# Patient Record
Sex: Female | Born: 1980 | Race: Black or African American | Hispanic: No | Marital: Married | Smoking: Never smoker
Health system: Southern US, Community
[De-identification: ages and names within clinical notes are randomized; demographics above are authoritative.]

## PROBLEM LIST (undated history)

## (undated) ENCOUNTER — Inpatient Hospital Stay (HOSPITAL_COMMUNITY): Payer: Self-pay

## (undated) DIAGNOSIS — K449 Diaphragmatic hernia without obstruction or gangrene: Secondary | ICD-10-CM

## (undated) DIAGNOSIS — M5430 Sciatica, unspecified side: Secondary | ICD-10-CM

## (undated) DIAGNOSIS — Z8489 Family history of other specified conditions: Secondary | ICD-10-CM

## (undated) DIAGNOSIS — M797 Fibromyalgia: Secondary | ICD-10-CM

## (undated) DIAGNOSIS — D649 Anemia, unspecified: Secondary | ICD-10-CM

## (undated) DIAGNOSIS — N393 Stress incontinence (female) (male): Secondary | ICD-10-CM

## (undated) HISTORY — PX: TONSILLECTOMY: SUR1361

## (undated) HISTORY — PX: LAPAROSCOPIC GASTRIC SLEEVE RESECTION WITH HIATAL HERNIA REPAIR: SHX6512

## (undated) HISTORY — PX: ADENOIDECTOMY: SUR15

## (undated) HISTORY — PX: HERNIA REPAIR: SHX51

## (undated) HISTORY — PX: TYMPANOSTOMY TUBE PLACEMENT: SHX32

---
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
## 2000-06-20 ENCOUNTER — Emergency Department (HOSPITAL_COMMUNITY): Admission: EM | Admit: 2000-06-20 | Discharge: 2000-06-20 | Payer: Self-pay | Admitting: Emergency Medicine

## 2000-06-20 ENCOUNTER — Encounter: Payer: Self-pay | Admitting: Emergency Medicine

## 2000-11-29 ENCOUNTER — Inpatient Hospital Stay (HOSPITAL_COMMUNITY): Admission: AD | Admit: 2000-11-29 | Discharge: 2000-11-29 | Payer: Self-pay | Admitting: Obstetrics & Gynecology

## 2001-02-02 ENCOUNTER — Inpatient Hospital Stay (HOSPITAL_COMMUNITY): Admission: AD | Admit: 2001-02-02 | Discharge: 2001-02-02 | Payer: Self-pay | Admitting: Obstetrics

## 2001-02-26 ENCOUNTER — Inpatient Hospital Stay (HOSPITAL_COMMUNITY): Admission: AD | Admit: 2001-02-26 | Discharge: 2001-02-26 | Payer: Self-pay | Admitting: *Deleted

## 2001-05-09 ENCOUNTER — Ambulatory Visit (HOSPITAL_COMMUNITY): Admission: RE | Admit: 2001-05-09 | Discharge: 2001-05-09 | Payer: Self-pay | Admitting: Obstetrics

## 2001-06-14 ENCOUNTER — Ambulatory Visit (HOSPITAL_COMMUNITY): Admission: RE | Admit: 2001-06-14 | Discharge: 2001-06-14 | Payer: Self-pay | Admitting: Obstetrics & Gynecology

## 2001-08-12 ENCOUNTER — Inpatient Hospital Stay (HOSPITAL_COMMUNITY): Admission: AD | Admit: 2001-08-12 | Discharge: 2001-08-12 | Payer: Self-pay | Admitting: *Deleted

## 2001-08-17 ENCOUNTER — Inpatient Hospital Stay (HOSPITAL_COMMUNITY): Admission: AD | Admit: 2001-08-17 | Discharge: 2001-08-17 | Payer: Self-pay | Admitting: Obstetrics and Gynecology

## 2001-09-24 ENCOUNTER — Inpatient Hospital Stay (HOSPITAL_COMMUNITY): Admission: AD | Admit: 2001-09-24 | Discharge: 2001-09-24 | Payer: Self-pay | Admitting: *Deleted

## 2001-09-24 ENCOUNTER — Encounter: Payer: Self-pay | Admitting: *Deleted

## 2001-09-25 ENCOUNTER — Inpatient Hospital Stay (HOSPITAL_COMMUNITY): Admission: AD | Admit: 2001-09-25 | Discharge: 2001-09-25 | Payer: Self-pay | Admitting: Obstetrics and Gynecology

## 2001-09-26 ENCOUNTER — Inpatient Hospital Stay (HOSPITAL_COMMUNITY): Admission: AD | Admit: 2001-09-26 | Discharge: 2001-09-26 | Payer: Self-pay | Admitting: *Deleted

## 2001-10-04 ENCOUNTER — Inpatient Hospital Stay (HOSPITAL_COMMUNITY): Admission: AD | Admit: 2001-10-04 | Discharge: 2001-10-04 | Payer: Self-pay | Admitting: *Deleted

## 2001-10-06 ENCOUNTER — Inpatient Hospital Stay (HOSPITAL_COMMUNITY): Admission: AD | Admit: 2001-10-06 | Discharge: 2001-10-06 | Payer: Self-pay | Admitting: *Deleted

## 2001-10-06 ENCOUNTER — Encounter (HOSPITAL_COMMUNITY): Admission: RE | Admit: 2001-10-06 | Discharge: 2001-10-06 | Payer: Self-pay | Admitting: *Deleted

## 2001-10-11 ENCOUNTER — Inpatient Hospital Stay (HOSPITAL_COMMUNITY): Admission: AD | Admit: 2001-10-11 | Discharge: 2001-10-14 | Payer: Self-pay | Admitting: *Deleted

## 2002-05-08 ENCOUNTER — Emergency Department (HOSPITAL_COMMUNITY): Admission: EM | Admit: 2002-05-08 | Discharge: 2002-05-08 | Payer: Self-pay | Admitting: Emergency Medicine

## 2002-05-24 ENCOUNTER — Inpatient Hospital Stay (HOSPITAL_COMMUNITY): Admission: AD | Admit: 2002-05-24 | Discharge: 2002-05-24 | Payer: Self-pay | Admitting: Obstetrics and Gynecology

## 2002-06-09 ENCOUNTER — Emergency Department (HOSPITAL_COMMUNITY): Admission: EM | Admit: 2002-06-09 | Discharge: 2002-06-09 | Payer: Self-pay | Admitting: Emergency Medicine

## 2002-06-09 ENCOUNTER — Encounter: Payer: Self-pay | Admitting: Emergency Medicine

## 2002-08-14 ENCOUNTER — Encounter: Payer: Self-pay | Admitting: Emergency Medicine

## 2002-08-14 ENCOUNTER — Emergency Department (HOSPITAL_COMMUNITY): Admission: EM | Admit: 2002-08-14 | Discharge: 2002-08-14 | Payer: Self-pay | Admitting: Emergency Medicine

## 2002-11-06 ENCOUNTER — Inpatient Hospital Stay (HOSPITAL_COMMUNITY): Admission: AD | Admit: 2002-11-06 | Discharge: 2002-11-06 | Payer: Self-pay | Admitting: Obstetrics and Gynecology

## 2002-11-08 ENCOUNTER — Inpatient Hospital Stay (HOSPITAL_COMMUNITY): Admission: AD | Admit: 2002-11-08 | Discharge: 2002-11-08 | Payer: Self-pay | Admitting: Obstetrics and Gynecology

## 2003-05-13 ENCOUNTER — Encounter (INDEPENDENT_AMBULATORY_CARE_PROVIDER_SITE_OTHER): Payer: Self-pay | Admitting: Specialist

## 2003-05-13 ENCOUNTER — Inpatient Hospital Stay (HOSPITAL_COMMUNITY): Admission: AD | Admit: 2003-05-13 | Discharge: 2003-05-13 | Payer: Self-pay | Admitting: Obstetrics & Gynecology

## 2003-05-15 ENCOUNTER — Inpatient Hospital Stay (HOSPITAL_COMMUNITY): Admission: AD | Admit: 2003-05-15 | Discharge: 2003-05-15 | Payer: Self-pay | Admitting: Obstetrics and Gynecology

## 2003-05-17 ENCOUNTER — Inpatient Hospital Stay (HOSPITAL_COMMUNITY): Admission: AD | Admit: 2003-05-17 | Discharge: 2003-05-17 | Payer: Self-pay | Admitting: Obstetrics and Gynecology

## 2003-05-20 ENCOUNTER — Inpatient Hospital Stay (HOSPITAL_COMMUNITY): Admission: AD | Admit: 2003-05-20 | Discharge: 2003-05-20 | Payer: Self-pay | Admitting: Obstetrics and Gynecology

## 2003-05-23 ENCOUNTER — Inpatient Hospital Stay (HOSPITAL_COMMUNITY): Admission: AD | Admit: 2003-05-23 | Discharge: 2003-05-23 | Payer: Self-pay | Admitting: Obstetrics and Gynecology

## 2003-05-30 ENCOUNTER — Inpatient Hospital Stay (HOSPITAL_COMMUNITY): Admission: AD | Admit: 2003-05-30 | Discharge: 2003-05-30 | Payer: Self-pay | Admitting: Obstetrics and Gynecology

## 2003-09-18 ENCOUNTER — Emergency Department (HOSPITAL_COMMUNITY): Admission: EM | Admit: 2003-09-18 | Discharge: 2003-09-18 | Payer: Self-pay | Admitting: Emergency Medicine

## 2004-11-19 ENCOUNTER — Inpatient Hospital Stay (HOSPITAL_COMMUNITY): Admission: AD | Admit: 2004-11-19 | Discharge: 2004-11-19 | Payer: Self-pay | Admitting: Obstetrics and Gynecology

## 2005-01-05 ENCOUNTER — Emergency Department (HOSPITAL_COMMUNITY): Admission: EM | Admit: 2005-01-05 | Discharge: 2005-01-05 | Payer: Self-pay | Admitting: Family Medicine

## 2005-07-30 ENCOUNTER — Emergency Department (HOSPITAL_COMMUNITY): Admission: EM | Admit: 2005-07-30 | Discharge: 2005-07-30 | Payer: Self-pay | Admitting: Family Medicine

## 2005-09-19 ENCOUNTER — Emergency Department (HOSPITAL_COMMUNITY): Admission: EM | Admit: 2005-09-19 | Discharge: 2005-09-19 | Payer: Self-pay | Admitting: Family Medicine

## 2005-11-17 ENCOUNTER — Emergency Department (HOSPITAL_COMMUNITY): Admission: EM | Admit: 2005-11-17 | Discharge: 2005-11-17 | Payer: Self-pay | Admitting: Family Medicine

## 2006-02-22 ENCOUNTER — Emergency Department (HOSPITAL_COMMUNITY): Admission: EM | Admit: 2006-02-22 | Discharge: 2006-02-22 | Payer: Self-pay | Admitting: Family Medicine

## 2006-05-26 ENCOUNTER — Emergency Department (HOSPITAL_COMMUNITY): Admission: EM | Admit: 2006-05-26 | Discharge: 2006-05-26 | Payer: Self-pay | Admitting: Emergency Medicine

## 2006-06-01 ENCOUNTER — Emergency Department (HOSPITAL_COMMUNITY): Admission: EM | Admit: 2006-06-01 | Discharge: 2006-06-01 | Payer: Self-pay | Admitting: Family Medicine

## 2006-10-07 ENCOUNTER — Emergency Department (HOSPITAL_COMMUNITY): Admission: EM | Admit: 2006-10-07 | Discharge: 2006-10-07 | Payer: Self-pay | Admitting: Emergency Medicine

## 2007-03-25 ENCOUNTER — Emergency Department (HOSPITAL_COMMUNITY): Admission: EM | Admit: 2007-03-25 | Discharge: 2007-03-25 | Payer: Self-pay | Admitting: Family Medicine

## 2007-03-27 ENCOUNTER — Emergency Department (HOSPITAL_COMMUNITY): Admission: EM | Admit: 2007-03-27 | Discharge: 2007-03-27 | Payer: Self-pay | Admitting: Emergency Medicine

## 2007-06-24 ENCOUNTER — Emergency Department (HOSPITAL_COMMUNITY): Admission: EM | Admit: 2007-06-24 | Discharge: 2007-06-24 | Payer: Self-pay | Admitting: Emergency Medicine

## 2007-08-03 ENCOUNTER — Inpatient Hospital Stay (HOSPITAL_COMMUNITY): Admission: AD | Admit: 2007-08-03 | Discharge: 2007-08-03 | Payer: Self-pay | Admitting: Gynecology

## 2007-08-15 ENCOUNTER — Inpatient Hospital Stay (HOSPITAL_COMMUNITY): Admission: AD | Admit: 2007-08-15 | Discharge: 2007-08-15 | Payer: Self-pay | Admitting: Gynecology

## 2007-10-27 ENCOUNTER — Ambulatory Visit (HOSPITAL_COMMUNITY): Admission: RE | Admit: 2007-10-27 | Discharge: 2007-10-27 | Payer: Self-pay | Admitting: Obstetrics & Gynecology

## 2007-11-02 ENCOUNTER — Emergency Department (HOSPITAL_COMMUNITY): Admission: EM | Admit: 2007-11-02 | Discharge: 2007-11-02 | Payer: Self-pay | Admitting: Emergency Medicine

## 2008-02-02 ENCOUNTER — Ambulatory Visit (HOSPITAL_COMMUNITY): Admission: RE | Admit: 2008-02-02 | Discharge: 2008-02-02 | Payer: Self-pay | Admitting: Obstetrics & Gynecology

## 2008-02-23 ENCOUNTER — Ambulatory Visit (HOSPITAL_COMMUNITY): Admission: RE | Admit: 2008-02-23 | Discharge: 2008-02-23 | Payer: Self-pay | Admitting: Obstetrics & Gynecology

## 2008-03-12 ENCOUNTER — Inpatient Hospital Stay (HOSPITAL_COMMUNITY): Admission: AD | Admit: 2008-03-12 | Discharge: 2008-03-13 | Payer: Self-pay | Admitting: Obstetrics & Gynecology

## 2008-03-17 ENCOUNTER — Inpatient Hospital Stay (HOSPITAL_COMMUNITY): Admission: AD | Admit: 2008-03-17 | Discharge: 2008-03-19 | Payer: Self-pay | Admitting: Obstetrics & Gynecology

## 2008-11-16 ENCOUNTER — Inpatient Hospital Stay (HOSPITAL_COMMUNITY): Admission: AD | Admit: 2008-11-16 | Discharge: 2008-11-16 | Payer: Self-pay | Admitting: Obstetrics & Gynecology

## 2008-11-26 ENCOUNTER — Ambulatory Visit (HOSPITAL_COMMUNITY): Admission: RE | Admit: 2008-11-26 | Discharge: 2008-11-26 | Payer: Self-pay | Admitting: Obstetrics & Gynecology

## 2008-11-26 ENCOUNTER — Inpatient Hospital Stay (HOSPITAL_COMMUNITY): Admission: RE | Admit: 2008-11-26 | Discharge: 2008-11-26 | Payer: Self-pay | Admitting: Obstetrics & Gynecology

## 2009-05-22 DIAGNOSIS — J45909 Unspecified asthma, uncomplicated: Secondary | ICD-10-CM

## 2009-05-22 DIAGNOSIS — R002 Palpitations: Secondary | ICD-10-CM

## 2009-05-22 DIAGNOSIS — G43909 Migraine, unspecified, not intractable, without status migrainosus: Secondary | ICD-10-CM | POA: Insufficient documentation

## 2009-05-22 DIAGNOSIS — N390 Urinary tract infection, site not specified: Secondary | ICD-10-CM | POA: Insufficient documentation

## 2009-05-22 DIAGNOSIS — R0602 Shortness of breath: Secondary | ICD-10-CM | POA: Insufficient documentation

## 2009-05-23 ENCOUNTER — Ambulatory Visit: Payer: Self-pay | Admitting: Cardiology

## 2009-05-23 DIAGNOSIS — I951 Orthostatic hypotension: Secondary | ICD-10-CM

## 2009-05-23 DIAGNOSIS — R55 Syncope and collapse: Secondary | ICD-10-CM

## 2009-07-03 ENCOUNTER — Inpatient Hospital Stay (HOSPITAL_COMMUNITY): Admission: AD | Admit: 2009-07-03 | Discharge: 2009-07-05 | Payer: Self-pay | Admitting: Obstetrics and Gynecology

## 2009-09-25 ENCOUNTER — Emergency Department (HOSPITAL_COMMUNITY): Admission: EM | Admit: 2009-09-25 | Discharge: 2009-09-25 | Payer: Self-pay | Admitting: Family Medicine

## 2009-12-14 ENCOUNTER — Emergency Department (HOSPITAL_COMMUNITY): Admission: EM | Admit: 2009-12-14 | Discharge: 2009-12-14 | Payer: Self-pay | Admitting: Family Medicine

## 2010-02-21 ENCOUNTER — Emergency Department (HOSPITAL_COMMUNITY): Admission: EM | Admit: 2010-02-21 | Discharge: 2010-02-21 | Payer: Self-pay | Admitting: Emergency Medicine

## 2010-02-23 ENCOUNTER — Emergency Department (HOSPITAL_COMMUNITY): Admission: EM | Admit: 2010-02-23 | Discharge: 2010-02-23 | Payer: Self-pay | Admitting: Emergency Medicine

## 2010-03-25 IMAGING — US US OB TRANSVAGINAL
1 series · 14 of 28 positions shown · non-contrast
Comparison: none

OBSTETRICAL ULTRASOUND:
 This ultrasound exam was performed in the [HOSPITAL] Ultrasound Department.  The OB US report was generated in the AS system, and faxed to the ordering physician.  This report is also available in [REDACTED] PACS.

[Series 1: us ob transvaginal · 14 of 29 slices shown]
[im 2/29]
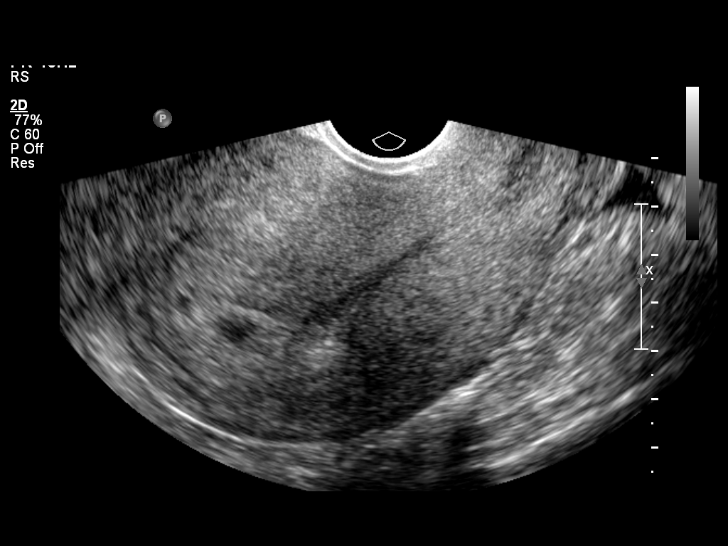
[im 4/29]
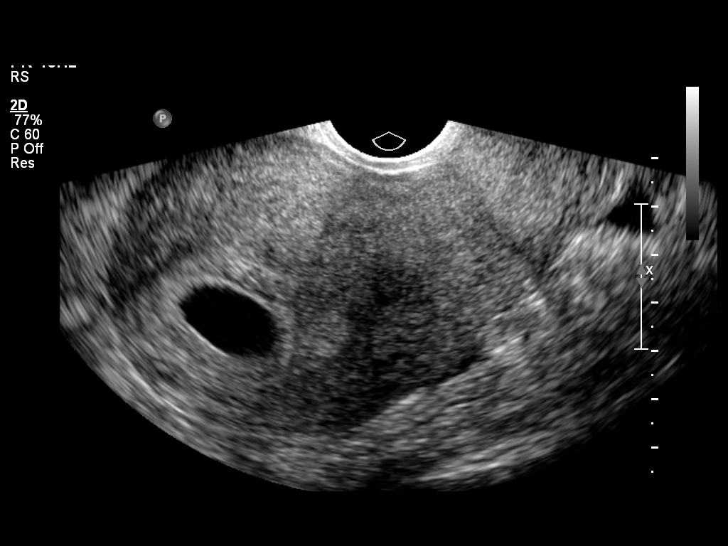
[im 6/29]
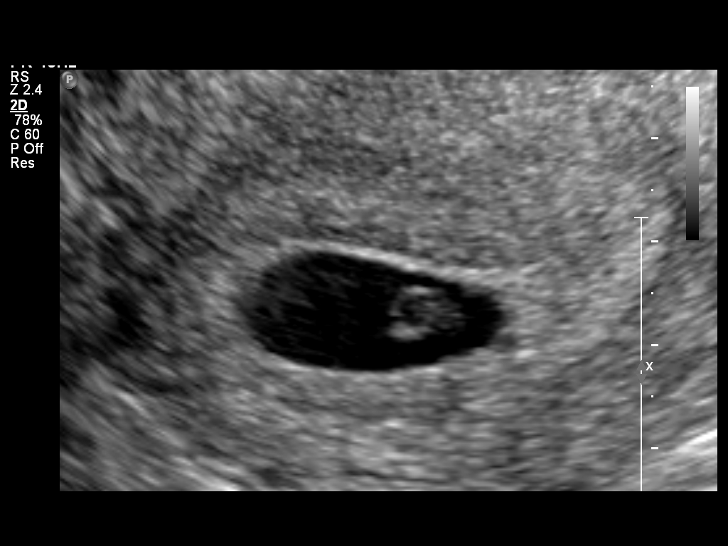
[im 8/29]
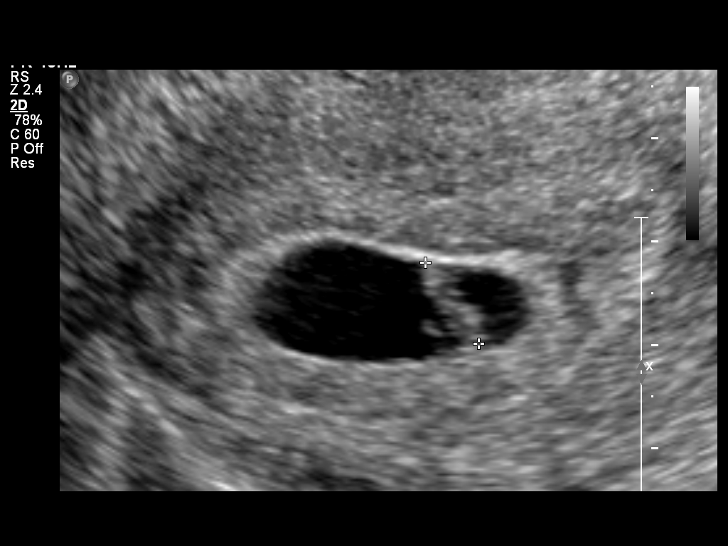
[im 10/29]
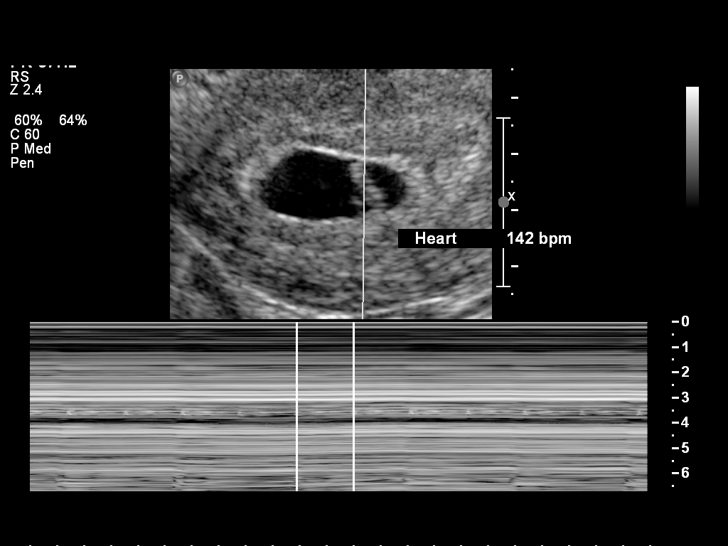
[im 12/29]
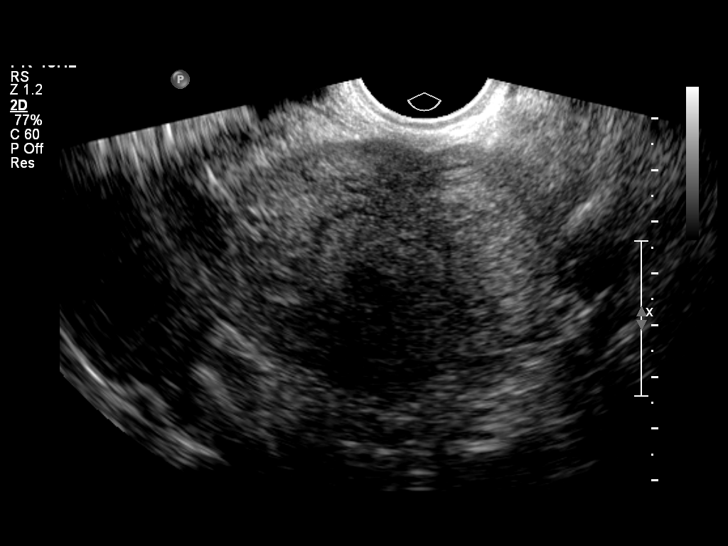
[im 14/29]
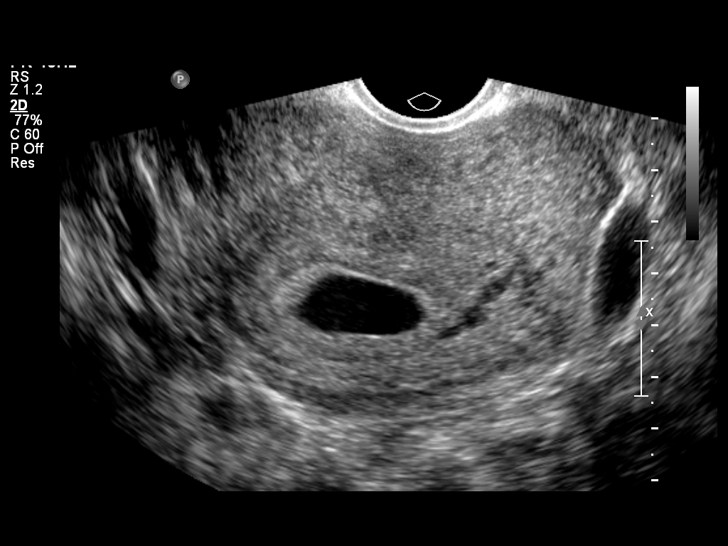
[im 16/29]
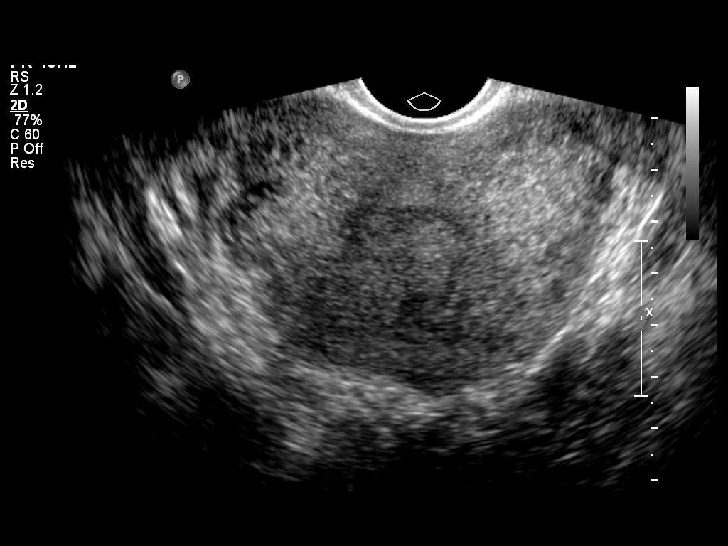
[im 18/29]
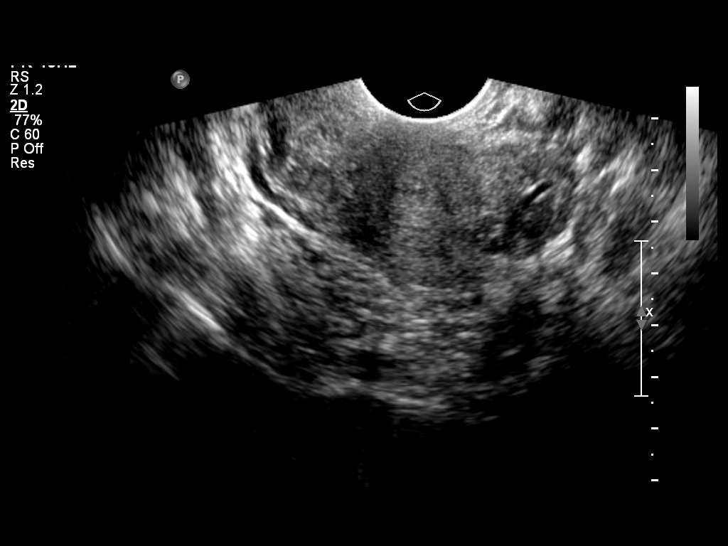
[im 20/29]
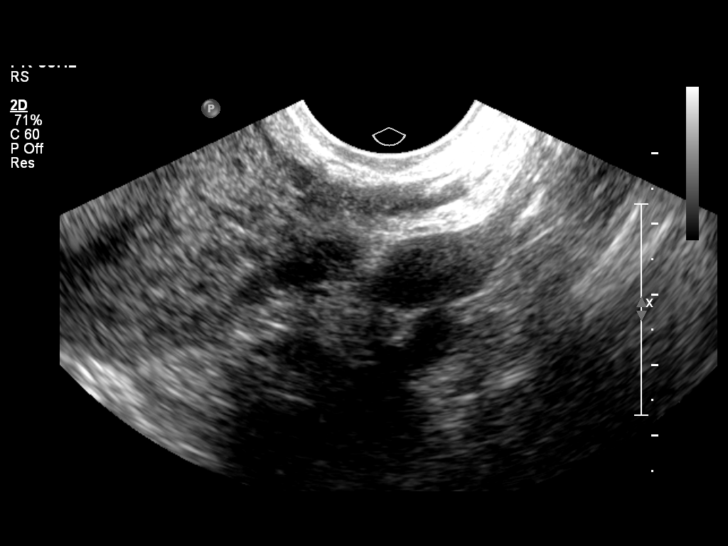
[im 22/29]
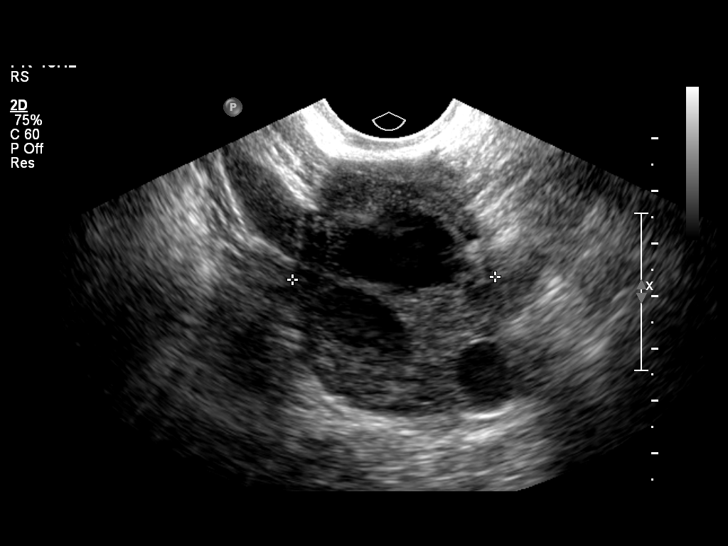
[im 24/29]
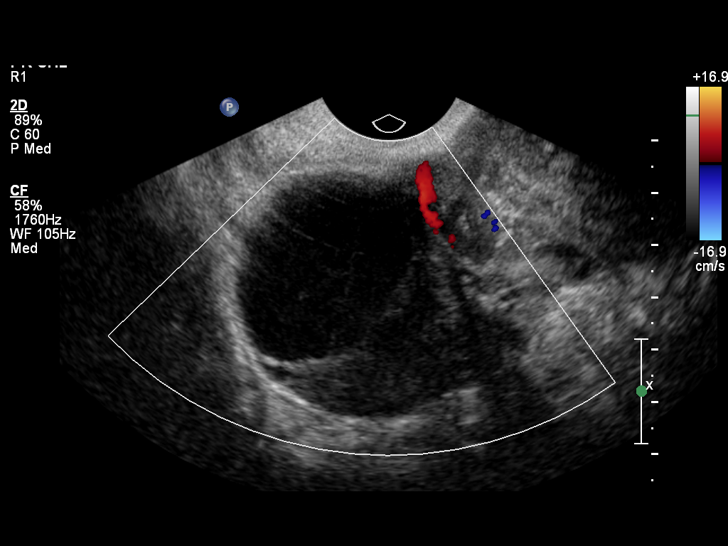
[im 26/29]
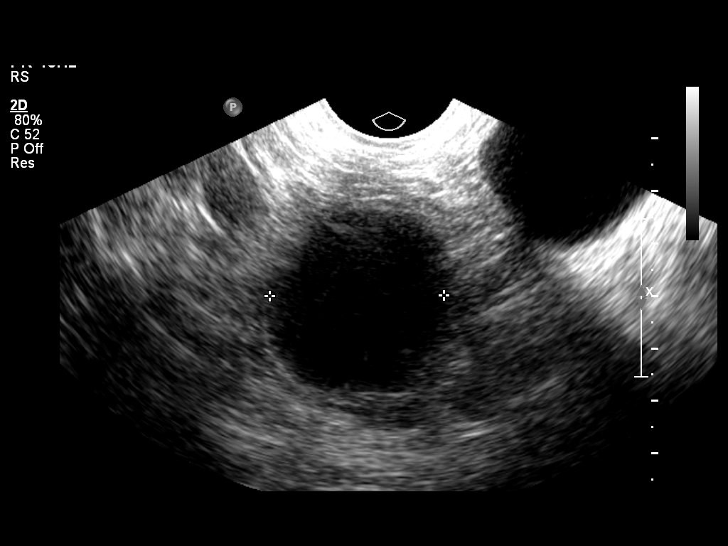
[im 29/29]
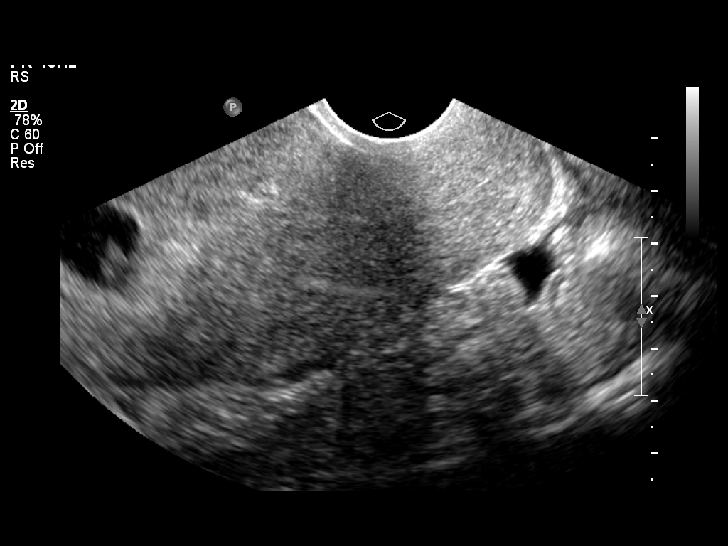

[14 of 28 positions shown; findings below may reference images not displayed]

IMPRESSION: See AS Obstetric US report.

## 2010-04-01 ENCOUNTER — Emergency Department (HOSPITAL_COMMUNITY): Admission: EM | Admit: 2010-04-01 | Discharge: 2010-04-01 | Payer: Self-pay | Admitting: Emergency Medicine

## 2010-06-17 NOTE — Letter (Signed)
Summary: [facility]   Imported By: Kassie Mends 07/11/2009 10:34:33  _____________________________________________________________________  External Attachment:    Type:   Image     Comment:   External Document

## 2010-06-17 NOTE — Assessment & Plan Note (Signed)
Summary: np6/sob/palps/[redacted] weeks pregnant   Visit Type:  new pt visit Primary Provider:  No PCP at this time  CC:  palpitations..sob.Marland Kitchensyncope episode.  History of Present Illness: [name] is referred by Dr. Ellyn Hack for syncope.  She was walking through the grocery store last week pushing a cart. She began to get lightheaded and nauseated. She then broke out in a cold sweat, walked outside to get some air, and slid down the [place]. Before she fainted, she cannot hear or see.  She awoke she was very tired and slept the rest today. She did not report any seizure activity and she was clear mentally when she came to.  She did not have any palpitations or chest pain prior to the event.  She reports a history of dizziness when she stands up too quickly. She does drink a lot of fluids. She does not eat salt. She tries not to skip meals. She does not drink a lot of caffeinated beverages nor alcohol.  A. recent hemoglobin was 11.9  Allergies (verified): 1)  ! Pcn 2)  ! Amoxicillin 3)  ! 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  Does not give any   significant history of alcohol usage.  She has no significant smoking   history.  Denies illicit drug use.      Review of Systems       negative other than history of present illness. There is no childhood history of syncope or seizures.  Vital Signs:  Patient profile:   30 year old female Height:      61 inches Weight:      235 pounds BMI:     44.56 Pulse rate:   101 / minute Pulse (ortho):   71  / minute Pulse rhythm:   irregular BP sitting:   109 / 64  (left arm) BP standing:   113 / 71 Cuff size:   large  Vitals Entered By: [name], CMA (May 23, 2009 3:21 PM)  Serial Vital Signs/Assessments:  Time      Position  BP       Pulse  Resp  Temp     By 3:31 PM   Lying [place]  109/64   102                   [name], CMA 3:31 PM   Sitting   114/72   104                   [name], [place] 3:32 PM   Standing  113/71   [address], [place] 3:35 PM   Standing  113/71   130                   [name], [place] 3:36 PM   Standing  111/72   131                   [name], [place] 3:37 PM   Standing  111/74   134  [name], CMA  Comments: 3:31 PM no sxms By: [name], CMA  3:31 PM floaters By: [name], CMA  3:32 PM floaters..sob.Marland Kitchentired By: [name], CMA  3:36 PM feels tired By: [name], CMA  3:37 PM feels tired By: [name], CMA    Physical Exam  General:  obese.  pregnant Head:  normocephalic and atraumatic Eyes:  PERRLA/EOM intact; conjunctiva and lids normal. Mouth:  Teeth, gums and palate normal. Oral mucosa normal. Neck:  Neck supple, no JVD. No masses, thyromegaly or abnormal cervical nodes. Chest Heron Pitcock:  no deformities or breast masses noted Lungs:  Clear bilaterally to auscultation and percussion. Heart:  PMI poorly appreciated, normal S1-S2 with a regular fast rate. No murmur gallop Abdomen:  gravida, positive bowel sound Msk:  Back normal, normal gait. Muscle strength and tone normal. Pulses:  pulses normal in all 4 extremities Extremities:  no edema Neurologic:  Alert and oriented x 3. Skin:  Intact without lesions or rashes. Psych:  Normal affect.   Problems:  Medical Problems Added: 1)  Dx of Orthostatic Hypotension  (ICD-458.0)  EKG  Procedure date:  05/23/2009  Findings:      sinus tachycardia, low QRS, normal PR QRS and QTC. No ST segment changes.  Impression &  Recommendations:  Problem # 1:  SYNCOPE (ICD-780.2) Assessment New by history and exam, she has orthostatic hypotension and most likely had a vasovagal event.-long time talking to her today about orthostatic precautions, held not to hold her breath when she is pushing or doing any kind of lifting. Also advised to stay well-hydrated non-caffeinated nonalcoholic beverages. She can liberalize her sodium as long as her blood pressure is stable. Positional preferences when she is asleep and getting out of bed or up from lying down were reviewed. I also told her that the cure for a possible faint is lying down and emphasized that she do this regardless of the circumstance. We will see her back p.r.n.  Problem # 2:  ORTHOSTATIC HYPOTENSION (ICD-458.0) Assessment: New See above description and explanation.  Other Orders: EKG w/ Interpretation (93000)  Patient Instructions: 1)  Your physician recommends that you schedule a follow-up appointment in: AS NEEDED 2)  Your physician recommends that you continue on your current medications as directed. Please refer to the Current Medication list given to you today. 3)  INCREASE FLUID INTAKE  NON CAFFIENE OR ALCOHOL

## 2010-07-31 LAB — CULTURE, ROUTINE-ABSCESS

## 2010-08-02 LAB — CBC
MCH: 26.5 pg (ref 26.0–34.0)
MCV: 81.5 fL (ref 78.0–100.0)
Platelets: 247 10*3/uL (ref 150–400)
RDW: 14.8 % (ref 11.5–15.5)
WBC: 7.5 10*3/uL (ref 4.0–10.5)

## 2010-08-02 LAB — DIFFERENTIAL
Basophils Relative: 1 % (ref 0–1)
Eosinophils Relative: 2 % (ref 0–5)
Lymphs Abs: 2.9 10*3/uL (ref 0.7–4.0)
Monocytes Absolute: 0.6 10*3/uL (ref 0.1–1.0)
Monocytes Relative: 8 % (ref 3–12)
Neutro Abs: 3.8 10*3/uL (ref 1.7–7.7)

## 2010-08-02 LAB — COMPREHENSIVE METABOLIC PANEL
AST: 15 U/L (ref 0–37)
Albumin: 3.9 g/dL (ref 3.5–5.2)
Alkaline Phosphatase: 82 U/L (ref 39–117)
Calcium: 9 mg/dL (ref 8.4–10.5)
Chloride: 105 mEq/L (ref 96–112)
Glucose, Bld: 88 mg/dL (ref 70–99)
Potassium: 4.1 mEq/L (ref 3.5–5.1)
Sodium: 138 mEq/L (ref 135–145)
Total Protein: 7.3 g/dL (ref 6.0–8.3)

## 2010-08-02 LAB — SEDIMENTATION RATE: Sed Rate: 7 mm/hr (ref 0–22)

## 2010-08-06 LAB — CBC
Hemoglobin: 9.8 g/dL — ABNORMAL LOW (ref 12.0–15.0)
MCHC: 32 g/dL (ref 30.0–36.0)
Platelets: 220 10*3/uL (ref 150–400)
RDW: 13.7 % (ref 11.5–15.5)

## 2010-08-24 LAB — CBC
HCT: 37.7 % (ref 36.0–46.0)
Hemoglobin: 12.7 g/dL (ref 12.0–15.0)
MCHC: 33.7 g/dL (ref 30.0–36.0)
MCV: 81.8 fL (ref 78.0–100.0)
Platelets: 250 10*3/uL (ref 150–400)
RDW: 14.2 % (ref 11.5–15.5)

## 2010-08-24 LAB — GC/CHLAMYDIA PROBE AMP, GENITAL: Chlamydia, DNA Probe: NEGATIVE

## 2010-08-24 LAB — HCG, QUANTITATIVE, PREGNANCY: hCG, Beta Chain, Quant, S: 7378 m[IU]/mL — ABNORMAL HIGH (ref <5)

## 2010-09-30 NOTE — H&P (Signed)
[name], [name]               ACCOUNT NO.:  [id number]   MEDICAL RECORD NO.:  [id number]          PATIENT TYPE:  INP   LOCATION:  9146                          FACILITY:  [facility]   PHYSICIAN:  Roseanna Rainbow, M.D.DATE OF BIRTH:  Dec 01, 1980   DATE OF ADMISSION:  03/17/2008  DATE OF DISCHARGE:                              HISTORY & PHYSICAL   CHIEF COMPLAINT:  The patient is a 30 year old para 1 with an estimated  date of confinement of March 27, 2008, with an intrauterine pregnancy  at 38 plus weeks', and complaining of ruptured membranes.   HISTORY OF PRESENT ILLNESS:  The patient reports ruptured membranes  several hours prior to presentation for clear fluid.   ALLERGIES:  PENICILLIN and LATEX.   MEDICATIONS:  Please see the medication reconciliation form.   PRENATAL LABS:  Blood type is AB positive.  Antibody screen negative.  Chlamydia probe negative.  Urine culture and sensitivity, insignificant  growth.  Pap smear negative.  Fetal fibronectin negative in August.  Free T4 in July normal.  Three-hour GTT normal.  One-hour GCT 141.  Gardnerella vaginalis positive in August.  Hepatitis B surface antigen  negative.  Hematocrit 33.7 and hemoglobin 11.1.  HIV nonreactive.  Quad  screen negative.  GBS negative.  CF testing negative.  Rubella immune.  RPR nonreactive.  Sickle cell negative.  TSH normal in July 2009.  Varicella immune.   PAST OBSTETRICAL HISTORY:  There is a history of an ectopic pregnancy in  May 2003, she has delivered of a liveborn female at full term, 7 pounds  11 ounces, vaginal delivery, no complications.   PAST GYN HISTORY:  Noncontributory.   PAST MEDICAL HISTORY:  Asthma.   PAST SURGICAL HISTORY:  Tonsils and adenoids, herniorrhaphy.   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  Does not give any  significant history of alcohol usage.  She has no significant smoking  history.  Denies illicit drug use.   FAMILY HISTORY:  Diabetes and  mild hypertension.   PHYSICAL EXAMINATION:  Vital signs, stable.  Afebrile.  Fetal heart  tracing reassuring.  Tocodynamometer, irregular uterine contractions.  Sterile vaginal exam, per the RN, the cervix is 2-3 cm dilated, 70%  effaced with a vertex at a -2 station, grossly ruptured, clear fluid.   ASSESSMENT:  Primipara at term, early labor.  Fetal heart tracing  consistent with fetal well being.   PLAN:  Admission, likely augmentation of labor with low-dose Pitocin per  protocol and monitor progress.      Roseanna Rainbow, M.D.  Electronically Signed     LAJ/MEDQ  D:  03/17/2008  T:  03/18/2008  Job:  [id number]

## 2011-02-09 LAB — ABO/RH: ABO/RH(D): AB POS

## 2011-02-09 LAB — HCG, QUANTITATIVE, PREGNANCY: hCG, Beta Chain, Quant, S: 2639 — ABNORMAL HIGH

## 2011-02-09 LAB — URINALYSIS, ROUTINE W REFLEX MICROSCOPIC
Bilirubin Urine: NEGATIVE
Glucose, UA: NEGATIVE
Nitrite: NEGATIVE
Nitrite: NEGATIVE
Specific Gravity, Urine: 1.025
Specific Gravity, Urine: >1.03 — ABNORMAL HIGH
Urobilinogen, UA: 0.2
pH: 5.5

## 2011-02-09 LAB — CBC
HCT: 39.5
Platelets: 285
RDW: 13.6

## 2011-02-09 LAB — WET PREP, GENITAL

## 2011-02-09 LAB — GC/CHLAMYDIA PROBE AMP, GENITAL: Chlamydia, DNA Probe: NEGATIVE

## 2011-02-09 LAB — POCT PREGNANCY, URINE: Preg Test, Ur: POSITIVE

## 2011-02-10 ENCOUNTER — Inpatient Hospital Stay (INDEPENDENT_AMBULATORY_CARE_PROVIDER_SITE_OTHER)
Admission: RE | Admit: 2011-02-10 | Discharge: 2011-02-10 | Disposition: A | Discharge status: Home / Self Care | Payer: Self-pay | Source: Ambulatory Visit | Attending: Family Medicine | Admitting: Family Medicine

## 2011-02-10 DIAGNOSIS — J019 Acute sinusitis, unspecified: Secondary | ICD-10-CM

## 2011-02-12 LAB — INFLUENZA A AND B ANTIGEN (CONVERTED LAB): Inflenza A Ag: NEGATIVE

## 2011-02-16 LAB — CBC
MCHC: 31.9
MCV: 84.7
Platelets: 221
RBC: 4.64

## 2011-02-16 LAB — RPR: RPR Ser Ql: NONREACTIVE

## 2011-02-16 LAB — URINALYSIS, ROUTINE W REFLEX MICROSCOPIC
Bilirubin Urine: NEGATIVE
Hgb urine dipstick: NEGATIVE
Ketones, ur: 15 — AB
Protein, ur: NEGATIVE
Urobilinogen, UA: 2 — ABNORMAL HIGH

## 2011-02-17 LAB — CBC
HCT: 34.4 — ABNORMAL LOW
Platelets: 195
RDW: 13.6

## 2011-04-24 ENCOUNTER — Emergency Department (INDEPENDENT_AMBULATORY_CARE_PROVIDER_SITE_OTHER)
Admission: EM | Admit: 2011-04-24 | Discharge: 2011-04-24 | Disposition: A | Discharge status: Home / Self Care | Payer: Self-pay | Source: Home / Self Care | Attending: Family Medicine | Admitting: Family Medicine

## 2011-04-24 ENCOUNTER — Encounter: Payer: Self-pay | Admitting: *Deleted

## 2011-04-24 DIAGNOSIS — J45901 Unspecified asthma with (acute) exacerbation: Secondary | ICD-10-CM

## 2011-04-24 MED ORDER — METHYLPREDNISOLONE SODIUM SUCC 125 MG IJ SOLR
INTRAMUSCULAR | Status: AC
  Filled 2011-04-24: qty 2

## 2011-04-24 MED ORDER — ALBUTEROL SULFATE HFA 108 (90 BASE) MCG/ACT IN AERS: 2.0000 | INHALATION_SPRAY | Freq: Four times a day (QID) | RESPIRATORY_TRACT | Status: DC | PRN

## 2011-04-24 MED ORDER — PREDNISONE 20 MG PO TABS: ORAL_TABLET | ORAL | Status: DC

## 2011-04-24 MED ORDER — METHYLPREDNISOLONE SODIUM SUCC 125 MG IJ SOLR
125.0000 mg | Freq: Once | INTRAMUSCULAR | Status: AC
  Administered 2011-04-24: 125 mg via INTRAMUSCULAR

## 2011-04-24 NOTE — ED Notes (Signed)
Pt is here with complaints of asthma exacerbation.  Reports SOB last night and this am and states she "passed out" at work.  EMS was called, provided a breathing treatment and released to go home.  Pt states she continues to feel SOB.  Lung sounds clear, speaking in complete sentences.

## 2011-04-24 NOTE — ED Provider Notes (Signed)
History     CSN: [id number] Arrival date & time: 04/24/2011  4:36 PM   First MD Initiated Contact with Patient 04/24/11 1642      Chief Complaint  Patient presents with  . Asthma    (Consider location/radiation/quality/duration/timing/severity/associated sxs/prior treatment) HPI Comments: H/o asthma had asthma attack last night was seen by EMS given a treatment. Today persistent cough with sporadic wheezing and using albuterol rather frequent for her (has used 3 times today), No wheezing right now. Needs refills. Denies fever, congestion or productive cough.   Past Medical History  Diagnosis Date  . Asthma   . Migraine     Past Surgical History  Procedure Date  . Tympanostomy tube placement     History reviewed. No pertinent family history.  History  Substance Use Topics  . Smoking status: Never Smoker   . Smokeless tobacco: Not on file  . Alcohol Use: No    OB History    Grav Para Term Preterm Abortions TAB SAB Ect Mult Living                  Review of Systems  Constitutional: Negative.   HENT: Negative for congestion, sore throat, rhinorrhea and sinus pressure.   Respiratory: Positive for shortness of breath and wheezing. Negative for cough and chest tightness.   Cardiovascular: Negative for chest pain and leg swelling.  Gastrointestinal: Negative for abdominal pain.  Musculoskeletal: Negative for myalgias and arthralgias.  Skin: Negative for rash.  Neurological: Negative for headaches.    Allergies  Amoxicillin; Latex; and Penicillins  Home Medications   Current Outpatient Rx  Name Route Sig Dispense Refill  . FLUTICASONE-SALMETEROL 115-21 MCG/ACT IN AERO Inhalation Inhale 2 puffs into the lungs 2 (two) times daily.      . ALBUTEROL SULFATE HFA 108 (90 BASE) MCG/ACT IN AERS Inhalation Inhale 2 puffs into the lungs every 6 (six) hours as needed. 1 Inhaler 1  . PREDNISONE 20 MG PO TABS  2 tabs po daily for 5 days 10 tablet no    BP 142/96  Pulse  104  Temp(Src) 99 F (37.2 C) (Oral)  Resp 22  SpO2 100%  LMP 04/24/2011  Physical Exam  Nursing note and vitals reviewed. Constitutional: She is oriented to person, place, and time. She appears well-developed and well-nourished. No distress.  HENT:  Head: Normocephalic and atraumatic.  Right Ear: External ear normal.  Left Ear: External ear normal.  Nose: Nose normal.  Mouth/Throat: Oropharynx is clear and moist. No oropharyngeal exudate.  Eyes: Conjunctivae are normal. Pupils are equal, round, and reactive to light.  Neck: Neck supple.  Pulmonary/Chest: Effort normal. No respiratory distress. She has no wheezes. She has no rales. She exhibits no tenderness.       Sporadic rhonchi bilaterally that fade after cough. No active wheezing good air movement. No prolonged expiration, orthopnea or tachypnea.  Abdominal: Soft. There is no tenderness.  Lymphadenopathy:    She has no cervical adenopathy.  Neurological: She is alert and oriented to person, place, and time.  Skin: Skin is warm. No rash noted.  Psychiatric: She has a normal mood and affect.    ED Course  Procedures (including critical care time)  Labs Reviewed - No data to display No results found.   1. Asthma exacerbation       MDM  Pt. Had administered solumedrol 125mg  IM here as she is not sure if she can fill her prednisone prescription today. Refilled albuterol and also have prescription  for oral prednisone for 5 days cycle. Normal physical exam including lungs on discharge.        Sharin Grave, MD 04/25/11 1425

## 2011-04-24 NOTE — ED Notes (Signed)
Patient reportedly "passed out" at work w her asthma, and was treated at the scene by EMS w [facility], and was advised to go home to rest, as she refuse transport after she improved w treatment. C/o she is having trouble again, and has already used her MDI prior to arrival. Able to speak in complete complex sentences w/o observable respiratory difficulty difficulty; was advised we would be able to bring back shortly

## 2011-04-29 ENCOUNTER — Other Ambulatory Visit: Payer: Self-pay

## 2011-04-29 ENCOUNTER — Encounter (HOSPITAL_COMMUNITY): Payer: Self-pay | Admitting: *Deleted

## 2011-04-29 ENCOUNTER — Emergency Department (HOSPITAL_COMMUNITY): Payer: Self-pay

## 2011-04-29 ENCOUNTER — Emergency Department (HOSPITAL_COMMUNITY)
Admission: EM | Admit: 2011-04-29 | Discharge: 2011-04-29 | Disposition: A | Discharge status: Home / Self Care | Payer: Self-pay | Attending: Emergency Medicine | Admitting: Emergency Medicine

## 2011-04-29 DIAGNOSIS — J45909 Unspecified asthma, uncomplicated: Secondary | ICD-10-CM | POA: Insufficient documentation

## 2011-04-29 DIAGNOSIS — R079 Chest pain, unspecified: Secondary | ICD-10-CM | POA: Insufficient documentation

## 2011-04-29 DIAGNOSIS — R05 Cough: Secondary | ICD-10-CM | POA: Insufficient documentation

## 2011-04-29 DIAGNOSIS — R55 Syncope and collapse: Secondary | ICD-10-CM | POA: Insufficient documentation

## 2011-04-29 DIAGNOSIS — R059 Cough, unspecified: Secondary | ICD-10-CM | POA: Insufficient documentation

## 2011-04-29 DIAGNOSIS — J40 Bronchitis, not specified as acute or chronic: Secondary | ICD-10-CM | POA: Insufficient documentation

## 2011-04-29 DIAGNOSIS — R0602 Shortness of breath: Secondary | ICD-10-CM | POA: Insufficient documentation

## 2011-04-29 DIAGNOSIS — R51 Headache: Secondary | ICD-10-CM | POA: Insufficient documentation

## 2011-04-29 MED ORDER — ALBUTEROL SULFATE (5 MG/ML) 0.5% IN NEBU
5.0000 mg | INHALATION_SOLUTION | Freq: Once | RESPIRATORY_TRACT | Status: AC
  Administered 2011-04-29: 5 mg via RESPIRATORY_TRACT
  Filled 2011-04-29: qty 1

## 2011-04-29 MED ORDER — KETOROLAC TROMETHAMINE 60 MG/2ML IM SOLN
60.0000 mg | Freq: Once | INTRAMUSCULAR | Status: AC
  Administered 2011-04-29: 60 mg via INTRAMUSCULAR
  Filled 2011-04-29: qty 2

## 2011-04-29 MED ORDER — PREDNISONE 20 MG PO TABS
60.0000 mg | ORAL_TABLET | Freq: Once | ORAL | Status: AC
  Administered 2011-04-29: 60 mg via ORAL
  Filled 2011-04-29: qty 3

## 2011-04-29 MED ORDER — PREDNISONE 20 MG PO TABS: 60.0000 mg | ORAL_TABLET | Freq: Every day | ORAL | Status: DC

## 2011-04-29 NOTE — ED Notes (Signed)
Pt reports she was seen at urgent care for asthma attack and prescribed prednisone and inhalers. States she has been using but states she just feel any better. Reports productive cough and headache. Denies fevers.

## 2011-04-29 NOTE — ED Provider Notes (Addendum)
History     CSN: [id number] Arrival date & time: 04/29/2011  3:14 PM   First MD Initiated Contact with Patient 04/29/11 1803      Chief Complaint  Patient presents with  . Cough  . Shortness of Breath  . Asthma    (Consider location/radiation/quality/duration/timing/severity/associated sxs/prior treatment) HPI The patient presents with cough, chest pain, headache. She notes the gradual onset of her symptoms approximately one week ago. Since onset her symptoms have been persistent. She notes an urgent care presentation 5 days ago during which she was prescribed steroids, additional inhalers. She notes the symptoms have been persistent. She does note one episode of syncope near the beginning of onset of symptoms following a prolonged coughing spell, but otherwise has no syncope, no non-cough associated chest pain, no vomiting, no diarrhea, no objective fever. Past Medical History  Diagnosis Date  . Asthma   . Migraine     Past Surgical History  Procedure Date  . Tympanostomy tube placement     History reviewed. No pertinent family history.  History  Substance Use Topics  . Smoking status: Never Smoker   . Smokeless tobacco: Not on file  . Alcohol Use: No    OB History    Grav Para Term Preterm Abortions TAB SAB Ect Mult Living                  Review of Systems  Constitutional:       HPI  HENT:       HPI otherwise negative  Eyes: Negative.   Respiratory:       HPI, otherwise negative  Cardiovascular:       HPI, otherwise nmegative  Gastrointestinal: Negative for vomiting.  Genitourinary:       HPI, otherwise negative  Musculoskeletal:       HPI, otherwise negative  Skin: Negative.   Neurological: Negative for syncope.    Allergies  Amoxicillin; Latex; and Penicillins  Home Medications   Current Outpatient Rx  Name Route Sig Dispense Refill  . ALBUTEROL SULFATE HFA 108 (90 BASE) MCG/ACT IN AERS Inhalation Inhale 2 puffs into the lungs every 6 (six)  hours as needed. For shortness of breath     . FLUTICASONE-SALMETEROL 115-21 MCG/ACT IN AERO Inhalation Inhale 2 puffs into the lungs 2 (two) times daily.      Marland Kitchen PREDNISONE 20 MG PO TABS Oral Take 40 mg by mouth daily. Take 40mg  daily for 5 days.  Last dose 04/29/2011.      BP 147/90  Pulse 90  Temp(Src) 98.1 F (36.7 C) (Oral)  Resp 16  SpO2 99%  LMP 04/24/2011  Physical Exam  Nursing note and vitals reviewed. Constitutional: She is oriented to person, place, and time. She appears well-developed and well-nourished.  HENT:  Head: Normocephalic and atraumatic.  Eyes: EOM are normal.  Cardiovascular: Normal rate and regular rhythm.   Pulmonary/Chest: Effort normal and breath sounds normal.  Abdominal: She exhibits no distension.  Musculoskeletal: She exhibits no edema and no tenderness.  Neurological: She is alert and oriented to person, place, and time.  Skin: Skin is warm and dry.    ED Course  Procedures (including critical care time)  Labs Reviewed - No data to display Dg Chest 2 View  04/29/2011  *RADIOLOGY REPORT*  Clinical Data: Productive cough for 40 days.  Asthma.  CHEST - 2 VIEW  Comparison: 04/01/2010.  Findings: No infiltrate, congestive heart failure or pneumothorax. Heart size within normal limits.  IMPRESSION: No  infiltrate.  Original Report Authenticated By: Fuller Canada, M.D.   x-ray reviewed by me   No diagnosis found.   Date: 04/29/2011  Rate: 79  Rhythm: normal sinus rhythm and sinus arrhythmia  QRS Axis: normal  Intervals: normal  ST/T Wave abnormalities: nonspecific T wave changes  Conduction Disutrbances:low voltaGE  Narrative Interpretation:   Old EKG Reviewed: unchanged  Abnormal ecg  MDM  This previously well young female now presents with ongoing cough, congestion, cough associated chest pain and mild dyspnea. On exam the patient is in no distress with essentially unremarkable vital signs and clear lung fields. The patient received  steroids, nebulizer treatments with significant improvement in her condition. The patient we discharged with instructions to continue steroids for an additional 5 days, and to continue using her inhaler for a presentation that is most consistent with bronchitis, resolving        Gerhard Munch, MD 04/29/11 Serena Croissant  Gerhard Munch, MD 04/29/11 1932

## 2011-04-29 NOTE — ED Notes (Signed)
Pt here with c/o sob/ wheezing that started on Thursday night and despite prescribed meds has not improved.  Pt reports onset of productive cough that started on Monday and reports green sputum.  Pts lungs clear at this time.  Pt reports HA and rates it 8/10.

## 2011-04-29 NOTE — ED Notes (Signed)
Discharge instructions reviewed with pt.  Verbalizes understanding.  No questions asked; no further c/o's voiced.  Pt ambulatory to lobby.  NAD noted.  VSS. 

## 2011-05-04 ENCOUNTER — Emergency Department (HOSPITAL_COMMUNITY)
Admission: EM | Admit: 2011-05-04 | Discharge: 2011-05-04 | Disposition: A | Discharge status: Home / Self Care | Payer: Self-pay | Attending: Emergency Medicine | Admitting: Emergency Medicine

## 2011-05-04 ENCOUNTER — Encounter (HOSPITAL_COMMUNITY): Payer: Self-pay | Admitting: Emergency Medicine

## 2011-05-04 DIAGNOSIS — R112 Nausea with vomiting, unspecified: Secondary | ICD-10-CM | POA: Insufficient documentation

## 2011-05-04 DIAGNOSIS — H53149 Visual discomfort, unspecified: Secondary | ICD-10-CM | POA: Insufficient documentation

## 2011-05-04 DIAGNOSIS — R5381 Other malaise: Secondary | ICD-10-CM | POA: Insufficient documentation

## 2011-05-04 DIAGNOSIS — R197 Diarrhea, unspecified: Secondary | ICD-10-CM | POA: Insufficient documentation

## 2011-05-04 DIAGNOSIS — G43909 Migraine, unspecified, not intractable, without status migrainosus: Secondary | ICD-10-CM | POA: Insufficient documentation

## 2011-05-04 DIAGNOSIS — J45909 Unspecified asthma, uncomplicated: Secondary | ICD-10-CM | POA: Insufficient documentation

## 2011-05-04 MED ORDER — KETOROLAC TROMETHAMINE 30 MG/ML IJ SOLN
30.0000 mg | Freq: Once | INTRAMUSCULAR | Status: AC
  Administered 2011-05-04: 30 mg via INTRAVENOUS
  Filled 2011-05-04: qty 1

## 2011-05-04 MED ORDER — DIPHENHYDRAMINE HCL 50 MG/ML IJ SOLN
25.0000 mg | Freq: Once | INTRAMUSCULAR | Status: AC
  Administered 2011-05-04: 25 mg via INTRAVENOUS
  Filled 2011-05-04: qty 1

## 2011-05-04 MED ORDER — SODIUM CHLORIDE 0.9 % IV BOLUS (SEPSIS)
2000.0000 mL | Freq: Once | INTRAVENOUS | Status: AC
  Administered 2011-05-04: 2000 mL via INTRAVENOUS

## 2011-05-04 MED ORDER — METOCLOPRAMIDE HCL 5 MG/ML IJ SOLN
10.0000 mg | Freq: Once | INTRAMUSCULAR | Status: AC
  Administered 2011-05-04: 10 mg via INTRAVENOUS
  Filled 2011-05-04: qty 2

## 2011-05-04 NOTE — ED Notes (Signed)
Pt c/o N/V and HA with cough and cold sx x 1 week; pt sts some fatigue x several days

## 2011-05-04 NOTE — ED Notes (Signed)
Pt here with persistent HA that has become more severe and started last Thursday.  Pt reports that pain is in her bilateral temples and she reports nausea with the pain as well as photophobia.  Pt rates pain 10/10 at this time.

## 2011-05-04 NOTE — ED Provider Notes (Signed)
Medical screening examination/treatment/procedure(s) were performed by non-physician practitioner and as supervising physician I was immediately available for consultation/collaboration.   Laray Anger, DO 05/04/11 2022

## 2011-05-04 NOTE — ED Provider Notes (Signed)
History     CSN: [id number] Arrival date & time: 05/04/2011 11:07 AM   First MD Initiated Contact with Patient 05/04/11 1320      Chief Complaint  Patient presents with  . Emesis  . Headache    (Consider location/radiation/quality/duration/timing/severity/associated sxs/prior treatment) Patient is a 30 y.o. female presenting with vomiting and headaches. The history is provided by the patient.  Emesis  Episode frequency: intermittent, stopping yesterday. Associated symptoms include headaches. Pertinent negatives include no fever.  Headache  The current episode started 2 days ago. The problem occurs constantly. The problem has been gradually worsening. The headache is associated with bright light. The pain is located in the temporal region. The quality of the pain is described as throbbing. The pain is at a severity of 10/10. The pain does not radiate. Associated symptoms include anorexia, malaise/fatigue, nausea and vomiting. Pertinent negatives include no fever, no near-syncope, no syncope and no shortness of breath. She has tried aspirin for the symptoms. The treatment provided mild relief.   Pt was seen on 04/24/11 at an urgent care after a syncopal episode secondary to an asthma attack, Dx with bronchospasm and given a prednisone dose pack.  Pt was seen for the same ssx on 04/29/11 in the ER, Dx with bronchitis and given 5 more days of prednisone. Pt then began to experience headache, diarrhea and nausea.  Pt had a few episodes of vomiting, but it resolved by yesterday AM.  Pt states Hx of migraine without clinical dx or treatment of the disorder.  Pt describes her headache as pounding, 10/10 located at the temporal region and nonradiating.  Pt admits to photophobia and c/o a metallic taste in her mouth.   She has had decreased PO intake since Saturday due to ssx.  Pt denies chest pain, shortness of breath, abdominal pain, dizziness, changes in vision or syncopal episodes.    Past Medical  History  Diagnosis Date  . Asthma   . Migraine     Past Surgical History  Procedure Date  . Tympanostomy tube placement     History reviewed. No pertinent family history.  History  Substance Use Topics  . Smoking status: Never Smoker   . Smokeless tobacco: Not on file  . Alcohol Use: No    OB History    Grav Para Term Preterm Abortions TAB SAB Ect Mult Living                  Review of Systems  Constitutional: Positive for malaise/fatigue. Negative for fever.  Respiratory: Negative for shortness of breath.   Cardiovascular: Negative for syncope and near-syncope.  Gastrointestinal: Positive for nausea, vomiting and anorexia.  Neurological: Positive for headaches.   All pertinent positives and negatives in the history of present illness  Allergies  Amoxicillin; Latex; and Penicillins  Home Medications   Current Outpatient Rx  Name Route Sig Dispense Refill  . ALBUTEROL SULFATE HFA 108 (90 BASE) MCG/ACT IN AERS Inhalation Inhale 2 puffs into the lungs every 6 (six) hours as needed. For shortness of breath     . FLUTICASONE-SALMETEROL 250-50 MCG/DOSE IN AEPB Inhalation Inhale 1 puff into the lungs every 12 (twelve) hours.      Marland Kitchen PREDNISONE 20 MG PO TABS Oral Take 40 mg by mouth daily. Take 40mg  daily for 5 days.  Last dose 04/29/2011.      BP 138/93  Pulse 82  Temp(Src) 97.9 F (36.6 C) (Oral)  Resp 18  SpO2 100%  LMP 04/24/2011  Physical Exam  Constitutional: She is oriented to person, place, and time. She appears well-developed and well-nourished.  HENT:  Head: Normocephalic and atraumatic.  Right Ear: External ear normal.  Left Ear: External ear normal.  Nose: Nose normal.  Mouth/Throat: Oropharynx is clear and moist.  Eyes: Conjunctivae and EOM are normal. Pupils are equal, round, and reactive to light.  Neck: Normal range of motion.  Cardiovascular: Normal rate, regular rhythm, normal heart sounds and intact distal pulses.  Exam reveals no gallop and  no friction rub.   No murmur heard. Pulmonary/Chest: Effort normal and breath sounds normal. No respiratory distress. She has no wheezes. She has no rales.  Abdominal: Soft. Bowel sounds are normal. She exhibits no distension and no mass. There is no tenderness. There is no rebound and no guarding.  Lymphadenopathy:    She has no cervical adenopathy.  Neurological: She is alert and oriented to person, place, and time. She has normal reflexes.  Skin: Skin is warm and dry. She is not diaphoretic.  Psychiatric: She has a normal mood and affect. Her behavior is normal. Judgment and thought content normal.    ED Course  Procedures (including critical care time)  Labs Reviewed - No data to display No results found.   Patient is feeling completely better after our treatments here in the emergency department.  Told to return here as needed for any worsening in her condition.increase her fluid intake    MDM  MDM Reviewed: previous chart, nursing note and vitals            Carlyle Dolly, PA-C 05/04/11 1546

## 2011-08-17 ENCOUNTER — Emergency Department (HOSPITAL_COMMUNITY)
Admission: EM | Admit: 2011-08-17 | Discharge: 2011-08-17 | Disposition: A | Discharge status: Home / Self Care | Payer: Self-pay | Attending: Emergency Medicine | Admitting: Emergency Medicine

## 2011-08-17 ENCOUNTER — Encounter (HOSPITAL_COMMUNITY): Payer: Self-pay | Admitting: *Deleted

## 2011-08-17 DIAGNOSIS — J329 Chronic sinusitis, unspecified: Secondary | ICD-10-CM | POA: Insufficient documentation

## 2011-08-17 DIAGNOSIS — Z880 Allergy status to penicillin: Secondary | ICD-10-CM | POA: Insufficient documentation

## 2011-08-17 DIAGNOSIS — J45909 Unspecified asthma, uncomplicated: Secondary | ICD-10-CM | POA: Insufficient documentation

## 2011-08-17 LAB — URINALYSIS, ROUTINE W REFLEX MICROSCOPIC
Bilirubin Urine: NEGATIVE
Nitrite: NEGATIVE
Specific Gravity, Urine: 1.014 (ref 1.005–1.030)
Urobilinogen, UA: 1 mg/dL (ref 0.0–1.0)

## 2011-08-17 LAB — POCT PREGNANCY, URINE: Preg Test, Ur: NEGATIVE

## 2011-08-17 MED ORDER — METOCLOPRAMIDE HCL 10 MG PO TABS: 10.0000 mg | ORAL_TABLET | Freq: Four times a day (QID) | ORAL | Status: DC | PRN

## 2011-08-17 MED ORDER — LEVOFLOXACIN 750 MG PO TABS: 750.0000 mg | ORAL_TABLET | Freq: Every day | ORAL | Status: AC

## 2011-08-17 MED ORDER — OXYCODONE-ACETAMINOPHEN 5-325 MG PO TABS: 2.0000 | ORAL_TABLET | ORAL | Status: AC | PRN

## 2011-08-17 NOTE — ED Notes (Signed)
Pt states "been congested since Thursday, been blowing blood out, the roof of my mouth and my upper lip just burns, my stomach to my back & when I got here I had diarrhea"

## 2011-08-17 NOTE — Discharge Instructions (Signed)
Sinusitis Sinuses are air pockets within the bones of your face. The growth of bacteria within a sinus leads to infection. The infection prevents the sinuses from draining. This infection is called sinusitis. SYMPTOMS  There will be different areas of pain depending on which sinuses have become infected.  The maxillary sinuses often produce pain beneath the eyes.   Frontal sinusitis may cause pain in the middle of the forehead and above the eyes.  Other problems (symptoms) include:  Toothaches.   Colored, pus-like (purulent) drainage from the nose.   Swelling, warmth, and tenderness over the sinus areas may be signs of infection.  TREATMENT  Sinusitis is most often determined by an exam.X-rays may be taken. If x-rays have been taken, make sure you obtain your results or find out how you are to obtain them. Your caregiver may give you medications (antibiotics). These are medications that will help kill the bacteria causing the infection. You may also be given a medication (decongestant) that helps to reduce sinus swelling.  HOME CARE INSTRUCTIONS   Only take over-the-counter or prescription medicines for pain, discomfort, or fever as directed by your caregiver.   Drink extra fluids. Fluids help thin the mucus so your sinuses can drain more easily.   Applying either moist heat or ice packs to the sinus areas may help relieve discomfort.   Use saline nasal sprays to help moisten your sinuses. The sprays can be found at your local drugstore.  SEEK IMMEDIATE MEDICAL CARE IF:  You have a fever.   You have increasing pain, severe headaches, or toothache.   You have nausea, vomiting, or drowsiness.   You develop unusual swelling around the face or trouble seeing.  MAKE SURE YOU:   Understand these instructions.   Will watch your condition.   Will get help right away if you are not doing well or get worse.  Document Released: 05/04/2005 Document Revised: 04/23/2011 Document Reviewed:  12/01/2006 [facility] Patient Information 2012 [place], [place].  RETURN IMMEDIATELY IF you develop shortness of breath, confusion or altered mental status, a new rash, become dizzy, faint, or poorly responsive, or are unable to be cared for at home.  Abdominal (belly) pain can be caused by many things. Your caregiver performed an examination and possibly ordered blood/urine tests and imaging (CT scan, x-rays, ultrasound). Many cases can be observed and treated at home after initial evaluation in the emergency department. Even though you are being discharged home, abdominal pain can be unpredictable. Therefore, you need a repeated exam if your pain does not resolve, returns, or worsens. Most patients with abdominal pain don't have to be admitted to the hospital or have surgery, but serious problems like appendicitis and gallbladder attacks can start out as nonspecific pain. Many abdominal conditions cannot be diagnosed in one visit, so follow-up evaluations are very important. SEEK IMMEDIATE MEDICAL ATTENTION IF: The pain does not go away or becomes severe.  A temperature above 101 develops.  Repeated vomiting occurs (multiple episodes).  The pain becomes localized to portions of the abdomen. The right side could possibly be appendicitis. In an adult, the left lower portion of the abdomen could be colitis or diverticulitis.  Blood is being passed in stools or vomit (bright red or black tarry stools).  Return also if you develop chest pain, difficulty breathing, dizziness or fainting, or become confused, poorly responsive, or inconsolable (young children).

## 2011-08-18 NOTE — ED Provider Notes (Signed)
History     CSN: [id number]  Arrival date & time 08/17/11  1335   First MD Initiated Contact with Patient 08/17/11 1634      Chief Complaint  Patient presents with  . Nasal Congestion  . Abdominal Pain    (Consider location/radiation/quality/duration/timing/severity/associated sxs/prior treatment) HPI 5 days nasal congestion and maxillary sinus pain with upper mouth pain yet teeth nontender, occasional scant epistaxis (not active), transient upper abdominal pain for several minutes at a time now gone, had one loose BM today nonbloody, thinks abdomen felt upset from swallowing purulent sinus drainage and some blood, no vomiting, no cough/CP/SOB/light headed, headache, AMS or change speech/vision/swallow/understanding and no focal weak/numb.Sxs constant 5 days. Past Medical History  Diagnosis Date  . Asthma   . Migraine     Past Surgical History  Procedure Date  . Tympanostomy tube placement     No family history on file.  History  Substance Use Topics  . Smoking status: Never Smoker   . Smokeless tobacco: Not on file  . Alcohol Use: No    OB History    Grav Para Term Preterm Abortions TAB SAB Ect Mult Living                  Review of Systems  Constitutional: Negative for fever.       10 Systems reviewed and are negative for acute change except as noted in the HPI.  HENT: Positive for nosebleeds, congestion, rhinorrhea, postnasal drip and sinus pressure. Negative for ear pain, sore throat, facial swelling, drooling, mouth sores, trouble swallowing, neck pain and neck stiffness.   Eyes: Negative for discharge and redness.  Respiratory: Negative for cough and shortness of breath.   Cardiovascular: Negative for chest pain.  Gastrointestinal: Positive for abdominal pain and diarrhea. Negative for vomiting.  Genitourinary: Negative for dysuria, vaginal bleeding and vaginal discharge.  Musculoskeletal: Negative for back pain.  Skin: Negative for rash.  Neurological:  Negative for syncope, numbness and headaches.  Psychiatric/Behavioral:       No behavior change.    Allergies  Amoxicillin; Latex; and Penicillins  Home Medications   Current Outpatient Rx  Name Route Sig Dispense Refill  . ALBUTEROL SULFATE HFA 108 (90 BASE) MCG/ACT IN AERS Inhalation Inhale 2 puffs into the lungs every 6 (six) hours as needed. For shortness of breath     . FLUTICASONE-SALMETEROL 250-50 MCG/DOSE IN AEPB Inhalation Inhale 1 puff into the lungs every 12 (twelve) hours.      Marland Kitchen LEVOFLOXACIN 750 MG PO TABS Oral Take 1 tablet (750 mg total) by mouth daily. X 7 days 7 tablet 0  . METOCLOPRAMIDE HCL 10 MG PO TABS Oral Take 1 tablet (10 mg total) by mouth every 6 (six) hours as needed (nausea/headache). 6 tablet 0  . OXYCODONE-ACETAMINOPHEN 5-325 MG PO TABS Oral Take 2 tablets by mouth every 4 (four) hours as needed for pain. 20 tablet 0    BP 125/86  Pulse 84  Temp(Src) 98 F (36.7 C) (Oral)  Resp 18  Wt 239 lb 14.4 oz (108.818 kg)  SpO2 100%  Physical Exam  Nursing note and vitals reviewed. Constitutional:       Awake, alert, nontoxic appearance.  HENT:  Head: Atraumatic.  Right Ear: External ear normal.  Left Ear: External ear normal.  Mouth/Throat: Oropharynx is clear and moist. No oropharyngeal exudate.       Sinus congestion with purulent discharge; teeth NT to percussion; no facial erythema; no active nosebleed or source  noted  Eyes: Conjunctivae are normal. Pupils are equal, round, and reactive to light. Right eye exhibits no discharge. Left eye exhibits no discharge.  Neck: Neck supple.  Cardiovascular: Normal rate and regular rhythm.   No murmur heard. Pulmonary/Chest: Effort normal and breath sounds normal. No respiratory distress. She has no wheezes. She has no rales. She exhibits no tenderness.  Abdominal: Soft. Bowel sounds are normal. There is no tenderness. There is no rebound.  Musculoskeletal: She exhibits no tenderness.       Baseline ROM, no  obvious new focal weakness.  Lymphadenopathy:    She has no cervical adenopathy.  Neurological: She is alert.       Mental status and motor strength appears baseline for patient and situation.  Skin: No rash noted.  Psychiatric: She has a normal mood and affect.    ED Course  Procedures (including critical care time)  Labs Reviewed  URINALYSIS, ROUTINE W REFLEX MICROSCOPIC - Abnormal; Notable for the following:    APPearance CLOUDY (*)    Hgb urine dipstick TRACE (*)    Protein, ur 100 (*)    Leukocytes, UA SMALL (*)    All other components within normal limits  URINE MICROSCOPIC-ADD ON - Abnormal; Notable for the following:    Squamous Epithelial / LPF FEW (*)    Bacteria, UA FEW (*)    All other components within normal limits  POCT PREGNANCY, URINE  LAB REPORT - SCANNED   No results found.   1. Sinusitis       MDM  Patient informed of clinical course, understand medical decision-making process, and agree with plan.I doubt any other EMC precluding discharge at this time including, but not necessarily limited to the following:meningitis.        Hurman Horn, MD 08/18/11 2152

## 2011-10-10 ENCOUNTER — Emergency Department (HOSPITAL_COMMUNITY)
Admission: EM | Admit: 2011-10-10 | Discharge: 2011-10-10 | Disposition: A | Discharge status: Home / Self Care | Payer: Self-pay | Attending: Emergency Medicine | Admitting: Emergency Medicine

## 2011-10-10 ENCOUNTER — Encounter (HOSPITAL_COMMUNITY): Payer: Self-pay | Admitting: Emergency Medicine

## 2011-10-10 DIAGNOSIS — N764 Abscess of vulva: Secondary | ICD-10-CM | POA: Insufficient documentation

## 2011-10-10 DIAGNOSIS — J3489 Other specified disorders of nose and nasal sinuses: Secondary | ICD-10-CM | POA: Insufficient documentation

## 2011-10-10 DIAGNOSIS — Z79899 Other long term (current) drug therapy: Secondary | ICD-10-CM | POA: Insufficient documentation

## 2011-10-10 DIAGNOSIS — J45909 Unspecified asthma, uncomplicated: Secondary | ICD-10-CM | POA: Insufficient documentation

## 2011-10-10 MED ORDER — CLINDAMYCIN HCL 300 MG PO CAPS: 300.0000 mg | ORAL_CAPSULE | Freq: Four times a day (QID) | ORAL | Status: AC

## 2011-10-10 MED ORDER — HYDROMORPHONE HCL PF 2 MG/ML IJ SOLN
2.0000 mg | Freq: Once | INTRAMUSCULAR | Status: AC
  Administered 2011-10-10: 2 mg via INTRAVENOUS
  Filled 2011-10-10: qty 1

## 2011-10-10 MED ORDER — LIDOCAINE-EPINEPHRINE 2 %-1:100000 IJ SOLN
20.0000 mL | Freq: Once | INTRAMUSCULAR | Status: AC
  Administered 2011-10-10: 10 mL

## 2011-10-10 NOTE — ED Notes (Signed)
Starting Wednesday, pt unable to sit and noticed reports abscess to vaginal area; pt also has cough, sinus infection

## 2011-10-10 NOTE — ED Notes (Addendum)
Family at bedside. 

## 2011-10-10 NOTE — ED Notes (Addendum)
Snack and drink offered to pt and family 

## 2011-10-10 NOTE — ED Provider Notes (Signed)
History     CSN: [id number]  Arrival date & time 10/10/11  1944   First MD Initiated Contact with Patient 10/10/11 2014      Chief Complaint  Patient presents with  . Abscess  . Nasal Congestion    (Consider location/radiation/quality/duration/timing/severity/associated sxs/prior treatment) HPI Comments: Abscess in left groin worsened over last few days.  No fevers.  C/o rhinorrhea but no other complaints and says that is not why she came to the ED.  Has had several abscesses in the past.  Patient is a 30 y.o. female presenting with abscess. The history is provided by the patient.  Abscess  This is a new problem. Episode onset: 3 days. The onset was gradual. The problem occurs continuously. The problem has been gradually worsening. Affected Location: left vaginal area. The problem is moderate. The abscess is characterized by redness (no drainage). The abscess first occurred at home. Pertinent negatives include no congestion.    Past Medical History  Diagnosis Date  . Asthma   . Migraine     Past Surgical History  Procedure Date  . Tympanostomy tube placement   . Hernia repair   . Tonsillectomy   . Adenoidectomy     History reviewed. No pertinent family history.  History  Substance Use Topics  . Smoking status: Never Smoker   . Smokeless tobacco: Not on file  . Alcohol Use: No    OB History    Grav Para Term Preterm Abortions TAB SAB Ect Mult Living                  Review of Systems  Constitutional: Negative for activity change.  HENT: Negative for congestion.   Eyes: Negative for visual disturbance.  Respiratory: Negative for chest tightness and shortness of breath.   Cardiovascular: Negative for chest pain and leg swelling.  Gastrointestinal: Negative for abdominal pain.  Genitourinary: Negative for dysuria.  Skin: Negative for rash.  Neurological: Negative for syncope.  Psychiatric/Behavioral: Negative for behavioral problems.    Allergies    Amoxicillin; Latex; and Penicillins  Home Medications   Current Outpatient Rx  Name Route Sig Dispense Refill  . ALBUTEROL SULFATE HFA 108 (90 BASE) MCG/ACT IN AERS Inhalation Inhale 2 puffs into the lungs every 6 (six) hours as needed. For shortness of breath     . FLUTICASONE-SALMETEROL 250-50 MCG/DOSE IN AEPB Inhalation Inhale 1 puff into the lungs every 12 (twelve) hours.      . IBUPROFEN 200 MG PO TABS Oral Take 400 mg by mouth every 6 (six) hours as needed. For pain    . CLINDAMYCIN HCL 300 MG PO CAPS Oral Take 1 capsule (300 mg total) by mouth 4 (four) times daily. X 7 days 28 capsule 0  . METOCLOPRAMIDE HCL 10 MG PO TABS Oral Take 1 tablet (10 mg total) by mouth every 6 (six) hours as needed (nausea/headache). 6 tablet 0    BP 138/65  Pulse 114  Temp(Src) 98.4 F (36.9 C) (Oral)  Resp 18  SpO2 100%  Physical Exam  Constitutional: She is oriented to person, place, and time. She appears well-developed and well-nourished.  HENT:  Head: Normocephalic and atraumatic.  Eyes: Conjunctivae and EOM are normal. Pupils are equal, round, and reactive to light. No scleral icterus.  Neck: Normal range of motion. Neck supple.  Cardiovascular: Normal rate and regular rhythm.  Exam reveals no gallop and no friction rub.   No murmur heard. Pulmonary/Chest: Effort normal and breath sounds normal. No respiratory  distress. She has no wheezes. She has no rales. She exhibits no tenderness.  Abdominal: Soft. She exhibits no distension and no mass. There is no tenderness. There is no rebound and no guarding.  Genitourinary:       L vulvar abscess with erythema.  2cm by 2cm induration.  Musculoskeletal: Normal range of motion.  Neurological: She is alert and oriented to person, place, and time. She has normal reflexes. No cranial nerve deficit.  Skin: Skin is warm and dry. No rash noted.  Psychiatric: She has a normal mood and affect. Her behavior is normal. Judgment and thought content normal.     ED Course  INCISION AND DRAINAGE Date/Time: 10/10/2011 9:50 PM Performed by: Army Chaco Authorized by: Nelia Shi Consent: Verbal consent obtained. Risks and benefits: risks, benefits and alternatives were discussed Consent given by: patient Type: abscess Body area: anogenital Location details: vulva Local anesthetic: lidocaine 1% with epinephrine Scalpel size: 11 Incision type: single straight Complexity: simple Drainage: purulent Drainage amount: moderate Wound treatment: wound left open Packing material: 1/4 in iodoform gauze Patient tolerance: Patient tolerated the procedure well with no immediate complications.   (including critical care time)  Labs Reviewed - No data to display No results found.   1. Labial abscess       MDM  Abscess in left groin worsened over last few days.  No fevers.  C/o rhinorrhea but no other complaints and says that is not why she came to the ED.  Has had several abscesses in the past.  No evidence of systemic infection.  Gave 2mg  dilaudid.  Abscess drained.  Put on clinda.  Will return in 48 hours for recheck.        Army Chaco, MD 10/10/11 2151

## 2011-10-10 NOTE — Discharge Instructions (Signed)
Vulvar Abscess   The vulva is made up of the large and small flaps of skin around the vagina opening. A vulvar abscess is an infected sac of yellowish white fluid (pus) in the skin flaps. Your doctor may make a small cut in the skin to drain the vulvar abscess.   HOME CARE   Only take medicine as told by your doctor.   Soak or take a warm water bath (sitz bath) 3 to 4 times a day for 15 to 20 minutes.   After you pee (urinate), always wipe from front to back.   Clean the vulvar abscess with soap and warm water. Do this after going to the bathroom.   Wear loose-fitting clothing.   Do not have sex until the vulvar abscess is gone or as told by your doctor.   GET HELP RIGHT AWAY IF:   You have a temperature by mouth above 102 F (38.9 C).   The vulva area becomes more painful, puffy (swollen), or red.   You have fluid coming from the vulva area that is red or tan.   You have pain when you pee or have a hard time peeing.   MAKE SURE YOU:   Understand these instructions.   Will watch your condition.   Will get help if you are not doing well or get worse.   Document Released: 07/31/2008 Document Revised: 04/23/2011 Document Reviewed: 07/31/2008   [employer] Patient Information 2012 [employer].

## 2011-10-11 NOTE — ED Provider Notes (Signed)
I saw and evaluated the patient, reviewed the resident's note and I agree with the findings and plan.   .Face to face Exam:  General:  Awake HEENT:  Atraumatic Resp:  Normal effort Abd:  Nondistended Neuro:No focal weakness Lymph: No adenopathy   Nelia Shi, MD 10/11/11 [phone]

## 2011-10-12 ENCOUNTER — Encounter (HOSPITAL_COMMUNITY): Payer: Self-pay | Admitting: Emergency Medicine

## 2011-10-12 ENCOUNTER — Emergency Department (HOSPITAL_COMMUNITY)
Admission: EM | Admit: 2011-10-12 | Discharge: 2011-10-12 | Disposition: A | Discharge status: Home / Self Care | Payer: Self-pay | Attending: Emergency Medicine | Admitting: Emergency Medicine

## 2011-10-12 DIAGNOSIS — N764 Abscess of vulva: Secondary | ICD-10-CM | POA: Insufficient documentation

## 2011-10-12 DIAGNOSIS — J45909 Unspecified asthma, uncomplicated: Secondary | ICD-10-CM | POA: Insufficient documentation

## 2011-10-12 MED ORDER — HYDROCODONE-ACETAMINOPHEN 5-325 MG PO TABS
1.0000 | ORAL_TABLET | Freq: Once | ORAL | Status: AC
  Administered 2011-10-12: 1 via ORAL
  Filled 2011-10-12: qty 1

## 2011-10-12 MED ORDER — HYDROCODONE-ACETAMINOPHEN 5-325 MG PO TABS: 1.0000 | ORAL_TABLET | Freq: Once | ORAL | Status: AC

## 2011-10-12 NOTE — ED Notes (Signed)
Patient states that she had an abscess drained two days ago around vaginal area and was told to come back in two days to have packing removed.  Patient states that she is still having pain in the abscess area.

## 2011-10-12 NOTE — ED Provider Notes (Signed)
History     CSN: [id number]  Arrival date & time 10/12/11  2008   None     Chief Complaint  Patient presents with  . Abscess    (Consider location/radiation/quality/duration/timing/severity/associated sxs/prior treatment) HPI Comments: Patient had a labial abscess.  I&D 2 days ago.  She is here for packing removal and recheck of her abscess  Patient is a 31 y.o. female presenting with abscess. The history is provided by the patient.  Abscess  This is a recurrent problem. The current episode started yesterday. The problem has been unchanged. Pertinent negatives include no fever and no vomiting.    Past Medical History  Diagnosis Date  . Asthma   . Migraine     Past Surgical History  Procedure Date  . Tympanostomy tube placement   . Hernia repair   . Tonsillectomy   . Adenoidectomy     History reviewed. No pertinent family history.  History  Substance Use Topics  . Smoking status: Never Smoker   . Smokeless tobacco: Not on file  . Alcohol Use: No    OB History    Grav Para Term Preterm Abortions TAB SAB Ect Mult Living                  Review of Systems  Constitutional: Negative for fever.  Gastrointestinal: Negative for nausea and vomiting.  Skin: Positive for wound.    Allergies  Amoxicillin; Latex; and Penicillins  Home Medications   Current Outpatient Rx  Name Route Sig Dispense Refill  . ALBUTEROL SULFATE HFA 108 (90 BASE) MCG/ACT IN AERS Inhalation Inhale 2 puffs into the lungs every 6 (six) hours as needed. For shortness of breath     . CLINDAMYCIN HCL 300 MG PO CAPS Oral Take 1 capsule (300 mg total) by mouth 4 (four) times daily. X 7 days 28 capsule 0  . FLUTICASONE-SALMETEROL 250-50 MCG/DOSE IN AEPB Inhalation Inhale 1 puff into the lungs every 12 (twelve) hours.      . IBUPROFEN 200 MG PO TABS Oral Take 400 mg by mouth every 6 (six) hours as needed. For pain    . HYDROCODONE-ACETAMINOPHEN 5-325 MG PO TABS Oral Take 1 tablet by mouth once.  15 tablet 0  . METOCLOPRAMIDE HCL 10 MG PO TABS Oral Take 1 tablet (10 mg total) by mouth every 6 (six) hours as needed (nausea/headache). 6 tablet 0    BP 139/81  Pulse 105  Temp(Src) 98.2 F (36.8 C) (Oral)  Resp 19  SpO2 98%  Physical Exam  Constitutional: She appears well-developed and well-nourished.  HENT:  Head: Normocephalic.  Neck: Normal range of motion.  Cardiovascular: Tachycardia present.   Pulmonary/Chest: Effort normal.  Musculoskeletal: Normal range of motion.  Neurological: She is alert.  Skin:       Packing removed.  Very little exudate on the packing.  There is no erythema around the incision site.  No pre-packing required at this time.  Due to patient's pain will prescribe a short course of pain medication.  Encouraged her to complete her antibiotics    ED Course  Procedures (including critical care time)  Labs Reviewed - No data to display No results found.   1. Abscess of labia majora       MDM   Packing removed from I&D in 2 days ago.  No surrounding erythema, swelling, small amount of exudate on the packing material.  Encouraged patient to continue taking her antibiotics.  She is requesting that her pain controlled  and ibuprofen.  Will prescribe short course of hydrocodone        Arman Filter, NP 10/12/11 2059  Arman Filter, NP 10/12/11 2059

## 2011-10-12 NOTE — Discharge Instructions (Signed)
Vulvar Abscess  The vulva is made up of the large and small flaps of skin around the vagina opening. A vulvar abscess is an infected sac of yellowish white fluid (pus) in the skin flaps. Your doctor may make a small cut in the skin to drain the vulvar abscess.  HOME CARE  Only take medicine as told by your doctor.   Soak or take a warm water bath (sitz bath) 3 to 4 times a day for 15 to 20 minutes.   After you pee (urinate), always wipe from front to back.   Clean the vulvar abscess with soap and warm water. Do this after going to the bathroom.   Wear loose-fitting clothing.   Do not have sex until the vulvar abscess is gone or as told by your doctor.  GET HELP RIGHT AWAY IF:   You have a temperature by mouth above 102 F (38.9 C).   The vulva area becomes more painful, puffy (swollen), or red.   You have fluid coming from the vulva area that is red or tan.   You have pain when you pee or have a hard time peeing.  MAKE SURE YOU:  Understand these instructions.   Will watch your condition.   Will get help if you are not doing well or get worse.  Document Released: 07/31/2008 Document Revised: 04/23/2011 Document Reviewed: 07/31/2008 [facility] Patient Information 2012 [place], [place]. The packing has been removed, you may still have a small amount of drainage try to keep a mini pad in place until there is no further drainage, you may shower, but not sit, and the bathtub until the skin has healed.  Continue taking her antibiotics as prescribed.  You have also been given a prescription for hydrocodone for better pain coverage

## 2011-10-13 LAB — CULTURE, ROUTINE-ABSCESS

## 2011-10-14 NOTE — ED Notes (Signed)
+  Abscess. Patient given Cleocin. No sensitivities listed. Chart sent to EDP office for review. °

## 2011-10-15 NOTE — ED Notes (Signed)
Chart returned from EDP office. No other tx indicated besides patient follow-up for wound check--Cleocin OK. Reviewed by Jaci Carrel PA-C.

## 2011-10-19 NOTE — ED Provider Notes (Signed)
Medical screening examination/treatment/procedure(s) were performed by non-physician practitioner and as supervising physician I was immediately available for consultation/collaboration.  Sherle Mello, MD 10/19/11 0846 

## 2012-05-09 ENCOUNTER — Encounter (HOSPITAL_COMMUNITY): Payer: Self-pay | Admitting: *Deleted

## 2012-05-09 ENCOUNTER — Inpatient Hospital Stay (HOSPITAL_COMMUNITY)
Admission: AD | Admit: 2012-05-09 | Discharge: 2012-05-09 | Disposition: A | Discharge status: Home / Self Care | Payer: Self-pay | Source: Ambulatory Visit | Attending: Obstetrics and Gynecology | Admitting: Obstetrics and Gynecology

## 2012-05-09 DIAGNOSIS — N938 Other specified abnormal uterine and vaginal bleeding: Secondary | ICD-10-CM | POA: Insufficient documentation

## 2012-05-09 DIAGNOSIS — N926 Irregular menstruation, unspecified: Secondary | ICD-10-CM

## 2012-05-09 DIAGNOSIS — R51 Headache: Secondary | ICD-10-CM | POA: Insufficient documentation

## 2012-05-09 DIAGNOSIS — N949 Unspecified condition associated with female genital organs and menstrual cycle: Secondary | ICD-10-CM | POA: Insufficient documentation

## 2012-05-09 DIAGNOSIS — R109 Unspecified abdominal pain: Secondary | ICD-10-CM | POA: Insufficient documentation

## 2012-05-09 DIAGNOSIS — R102 Pelvic and perineal pain: Secondary | ICD-10-CM

## 2012-05-09 LAB — URINALYSIS, ROUTINE W REFLEX MICROSCOPIC
Bilirubin Urine: NEGATIVE
Glucose, UA: NEGATIVE mg/dL
Hgb urine dipstick: NEGATIVE
Ketones, ur: 15 mg/dL — AB
Leukocytes, UA: NEGATIVE
Nitrite: NEGATIVE
Protein, ur: NEGATIVE mg/dL
Specific Gravity, Urine: 1.02 (ref 1.005–1.030)
Urobilinogen, UA: 2 mg/dL — ABNORMAL HIGH (ref 0.0–1.0)
pH: 6.5 (ref 5.0–8.0)

## 2012-05-09 LAB — POCT PREGNANCY, URINE: Preg Test, Ur: NEGATIVE

## 2012-05-09 MED ORDER — IBUPROFEN 600 MG PO TABS: 600.0000 mg | ORAL_TABLET | Freq: Four times a day (QID) | ORAL | Status: DC | PRN

## 2012-05-09 MED ORDER — OXYCODONE-ACETAMINOPHEN 5-325 MG PO TABS
1.0000 | ORAL_TABLET | Freq: Four times a day (QID) | ORAL | Status: DC | PRN
  Administered 2012-05-09: 1 via ORAL
  Filled 2012-05-09: qty 1

## 2012-05-09 NOTE — Progress Notes (Signed)
Written and verbal d/c instructions given and understanding voiced. 

## 2012-05-09 NOTE — MAU Note (Signed)
Has been cramping like cycle is going to start.  Usually comes on around the 15th, last month it was the 19th, has yet to start this month.  Having some nausea and a headache.  Did a home test yesterday and today- both were negative.

## 2012-05-09 NOTE — MAU Provider Note (Signed)
CC: Abdominal Pain    First Provider Initiated Contact with Patient 05/09/12 1921      HPI [name] is a 31 y.o. [phone] with 1 wk hx suprapubic cramping that feels like premenstrual cramps. Tried Tylenol without relief.  No contraception.  Usually onset is the 15th of the month, but lst month was the 19th and she is concerned that onset is late again. HPT neg. Also has a headache and is planning to see a neurologist for headaches. Nauseated earlier but not now. Declines STI testing.  Past Medical History  Diagnosis Date  . Asthma   . Migraine     OB History    Grav Para Term Preterm Abortions TAB SAB Ect Mult Living   4 3 3  1   1        # Outc Date GA Lbr Len/2nd Wgt Sex Del Anes PTL Lv   1 TRM 5/03    F SVD      2 TRM 10/09    M SVD      3 TRM 2/11    M SVD      4 ECT             GYN: recent cx biopsy for abnormal cx cytology  Past Surgical History  Procedure Date  . Tympanostomy tube placement   . Hernia repair   . Tonsillectomy   . Adenoidectomy     History   Social History  . Marital Status: Single    Spouse Name: N/A    Number of Children: N/A  . Years of Education: N/A   Occupational History  . Not on file.   Social History Main Topics  . Smoking status: Never Smoker   . Smokeless tobacco: Not on file  . Alcohol Use: No  . Drug Use: No  . Sexually Active:    Other Topics Concern  . Not on file   Social History Narrative  . No narrative on file    No current facility-administered medications on file prior to encounter.   Current Outpatient Prescriptions on File Prior to Encounter  Medication Sig Dispense Refill  . albuterol (PROVENTIL HFA;VENTOLIN HFA) 108 (90 BASE) MCG/ACT inhaler Inhale 2 puffs into the lungs every 6 (six) hours as needed. For shortness of breath       . Fluticasone-Salmeterol (ADVAIR) 250-50 MCG/DOSE AEPB Inhale 1 puff into the lungs every 12 (twelve) hours.        Marland Kitchen ibuprofen (ADVIL,MOTRIN) 200 MG tablet Take 400 mg  by mouth every 6 (six) hours as needed. For pain      . metoCLOPramide (REGLAN) 10 MG tablet Take 1 tablet (10 mg total) by mouth every 6 (six) hours as needed (nausea/headache).  6 tablet  0    Allergies  Allergen Reactions  . Amoxicillin Hives  . Latex Other (See Comments)    Burns and blisters skin  . Penicillins Hives    ROS Pertinent items in HPI  PHYSICAL EXAM Filed Vitals:   05/09/12 1902  BP: 134/85  Pulse: 94  Temp: 98.6 F (37 C)  Resp: 20   General: Obese female in no acute distress Cardiovascular: Normal rate Respiratory: Normal effort Abdomen: Soft, nontender Back: No CVAT Extremities: No edema Neurologic: Alert and oriented Speculum exam: NEFG; vagina with physiologic discharge, no blood; cervix clean Bimanual exam: Mild CMT and moderate discomfort on palpation of uterus, difficult exam due to body habitus, no adnexal tenderness or masses noted  LAB RESULTS Results  for orders placed during the hospital encounter of 05/09/12 (from the past 24 hour(s))  URINALYSIS, ROUTINE W REFLEX MICROSCOPIC     Status: Abnormal   Collection Time   05/09/12  6:05 PM      Component Value Range   Color, Urine YELLOW  YELLOW   APPearance CLEAR  CLEAR   Specific Gravity, Urine 1.020  1.005 - 1.030   pH 6.5  5.0 - 8.0   Glucose, UA NEGATIVE  NEGATIVE mg/dL   Hgb urine dipstick NEGATIVE  NEGATIVE   Bilirubin Urine NEGATIVE  NEGATIVE   Ketones, ur 15 (*) NEGATIVE mg/dL   Protein, ur NEGATIVE  NEGATIVE mg/dL   Urobilinogen, UA 2.0 (*) 0.0 - 1.0 mg/dL   Nitrite NEGATIVE  NEGATIVE   Leukocytes, UA NEGATIVE  NEGATIVE  POCT PREGNANCY, URINE     Status: Normal   Collection Time   05/09/12  7:19 PM      Component Value Range   Preg Test, Ur NEGATIVE  NEGATIVE    IMAGING No results found.  MAU COURSE Percocet with relief of headache and alleviation of cramps  ASSESSMENT  1. Irregular menses   2. Pelvic cramping   headache  PLAN C/W Dr. Senaida Ores Discharge home.  See AVS for patient education.  Follow-up Information    Schedule an appointment as soon as possible for a visit with Oliver Pila, MD. (If symptoms worsen. Also make appointment as planned to start on contraception. Check Home pregnnacy test in 1 week if still amenorrheic.)    Contact information:   [address], [address] [place] [address] [phone]           Medication List     As of 05/09/2012  8:12 PM    TAKE these medications         acetaminophen 500 MG tablet   Commonly known as: TYLENOL   Take 1,000 mg by mouth every 6 (six) hours as needed. cramping      albuterol 108 (90 BASE) MCG/ACT inhaler   Commonly known as: PROVENTIL HFA;VENTOLIN HFA   Inhale 2 puffs into the lungs every 6 (six) hours as needed. For shortness of breath      Fluticasone-Salmeterol 250-50 MCG/DOSE Aepb   Commonly known as: ADVAIR   Inhale 1 puff into the lungs every 12 (twelve) hours.      ibuprofen 600 MG tablet   Commonly known as: ADVIL,MOTRIN   Take 1 tablet (600 mg total) by mouth every 6 (six) hours as needed for pain.      metoCLOPramide 10 MG tablet   Commonly known as: REGLAN   Take 1 tablet (10 mg total) by mouth every 6 (six) hours as needed (nausea/headache).           Danae Orleans, CNM 05/09/2012 7:22 PM

## 2012-05-14 ENCOUNTER — Inpatient Hospital Stay (HOSPITAL_COMMUNITY)
Admission: AD | Admit: 2012-05-14 | Discharge: 2012-05-14 | Disposition: A | Discharge status: Home / Self Care | Payer: Self-pay | Source: Ambulatory Visit | Attending: Obstetrics and Gynecology | Admitting: Obstetrics and Gynecology

## 2012-05-14 ENCOUNTER — Inpatient Hospital Stay (HOSPITAL_COMMUNITY): Payer: Self-pay

## 2012-05-14 DIAGNOSIS — R51 Headache: Secondary | ICD-10-CM | POA: Insufficient documentation

## 2012-05-14 DIAGNOSIS — R109 Unspecified abdominal pain: Secondary | ICD-10-CM | POA: Insufficient documentation

## 2012-05-14 DIAGNOSIS — M549 Dorsalgia, unspecified: Secondary | ICD-10-CM | POA: Insufficient documentation

## 2012-05-14 DIAGNOSIS — R102 Pelvic and perineal pain: Secondary | ICD-10-CM

## 2012-05-14 DIAGNOSIS — N949 Unspecified condition associated with female genital organs and menstrual cycle: Secondary | ICD-10-CM | POA: Insufficient documentation

## 2012-05-14 LAB — RAPID URINE DRUG SCREEN, HOSP PERFORMED
Amphetamines: NOT DETECTED
Barbiturates: NOT DETECTED
Cocaine: NOT DETECTED
Opiates: NOT DETECTED
Tetrahydrocannabinol: NOT DETECTED

## 2012-05-14 LAB — URINALYSIS, ROUTINE W REFLEX MICROSCOPIC
Bilirubin Urine: NEGATIVE
Hgb urine dipstick: NEGATIVE
Ketones, ur: NEGATIVE mg/dL
Nitrite: NEGATIVE
Specific Gravity, Urine: >1.03 — ABNORMAL HIGH (ref 1.005–1.030)
Urobilinogen, UA: 1 mg/dL (ref 0.0–1.0)
pH: 6 (ref 5.0–8.0)

## 2012-05-14 LAB — PROLACTIN: Prolactin: 8.1 ng/mL

## 2012-05-14 LAB — FOLLICLE STIMULATING HORMONE: FSH: 4 m[IU]/mL

## 2012-05-14 LAB — URINE MICROSCOPIC-ADD ON

## 2012-05-14 LAB — WET PREP, GENITAL: Yeast Wet Prep HPF POC: NONE SEEN

## 2012-05-14 LAB — TSH: TSH: 1.039 u[IU]/mL (ref 0.350–4.500)

## 2012-05-14 MED ORDER — MEDROXYPROGESTERONE ACETATE 10 MG PO TABS: 10.0000 mg | ORAL_TABLET | Freq: Every day | ORAL | Status: DC

## 2012-05-14 MED ORDER — TRAMADOL HCL 50 MG PO TABS: 50.0000 mg | ORAL_TABLET | Freq: Four times a day (QID) | ORAL | Status: DC | PRN

## 2012-05-14 MED ORDER — KETOROLAC TROMETHAMINE 60 MG/2ML IM SOLN
60.0000 mg | Freq: Once | INTRAMUSCULAR | Status: AC
  Administered 2012-05-14: 60 mg via INTRAMUSCULAR
  Filled 2012-05-14: qty 2

## 2012-05-14 NOTE — MAU Note (Signed)
Pt now states also has red spots and burning bilaterally on inner thighs

## 2012-05-14 NOTE — MAU Note (Signed)
Pt presents with cramping and back pain, headache and rash on inner thighs. Pt was taking ibuprofen for the pain but states it was not working so she last had it last night.

## 2012-05-14 NOTE — MAU Provider Note (Signed)
History     CSN: [id number]  Arrival date and time: 05/14/12 1949   First Provider Initiated Contact with Patient 05/14/12 2030      Chief Complaint  Patient presents with  . Abdominal Cramping  . Back Pain  . Headache   HPI  She her IUD removed in August, and states she has had 28-30 day cycles since it's removal. However, in November her period was late and again this month her period is late. She states that she is having 10/10 abdominal pain and back pain. Her doctors office had told her to re-test on Monday. She was seen here on 12/23 with the same symptoms.   Past Medical History  Diagnosis Date  . Asthma   . Migraine     Past Surgical History  Procedure Date  . Tympanostomy tube placement   . Hernia repair   . Tonsillectomy   . Adenoidectomy     No family history on file.  History  Substance Use Topics  . Smoking status: Never Smoker   . Smokeless tobacco: Not on file  . Alcohol Use: No    Allergies:  Allergies  Allergen Reactions  . Amoxicillin Hives  . Latex Other (See Comments)    Burns and blisters skin  . Penicillins Hives    Prescriptions prior to admission  Medication Sig Dispense Refill  . acetaminophen (TYLENOL) 500 MG tablet Take 1,000 mg by mouth every 6 (six) hours as needed. cramping      . albuterol (PROVENTIL HFA;VENTOLIN HFA) 108 (90 BASE) MCG/ACT inhaler Inhale 2 puffs into the lungs every 6 (six) hours as needed. For shortness of breath       . Fluticasone-Salmeterol (ADVAIR) 250-50 MCG/DOSE AEPB Inhale 1 puff into the lungs every 12 (twelve) hours.        Marland Kitchen ibuprofen (ADVIL,MOTRIN) 600 MG tablet Take 1 tablet (600 mg total) by mouth every 6 (six) hours as needed for pain.  30 tablet  1    Review of Systems  Constitutional: Negative for fever and chills.  Gastrointestinal: Positive for abdominal pain. Negative for nausea, vomiting, diarrhea and constipation.  Genitourinary: Negative for dysuria, urgency and frequency.    Musculoskeletal: Negative for myalgias.   Physical Exam   Blood pressure 120/93, pulse 97, temperature 98 F (36.7 C), resp. rate 20, height 5\' 1"  (1.549 m), weight 109.68 kg (241 lb 12.8 oz), last menstrual period 04/05/2012.  Physical Exam  Nursing note and vitals reviewed. Constitutional: She is oriented to person, place, and time. She appears well-developed and well-nourished. No distress.  Cardiovascular: Normal rate.   Respiratory: Effort normal.  GI: Soft.  Genitourinary:        External: normal Vagina: normal, small amount of thin white discharge Cervix: pink, smooth. CMT  Neurological: She is alert and oriented to person, place, and time.  Skin: Skin is warm and dry.  Psychiatric: She has a normal mood and affect.    MAU Course  Procedures  2058: Spoke with Dr. Senaida Ores. OK for do [place], TSH, FSH, prolactin and start on Provera 10mg  q day x 5days.   Assessment and Plan  Care turned over to Truitt Merle, MD  Tawnya Crook 05/14/2012, 8:37 PM    Care assumed at 21:00. Patient returned from ultrasound:  *RADIOLOGY REPORT*  Clinical Data: Pelvic pain.  TRANSABDOMINAL AND TRANSVAGINAL ULTRASOUND OF PELVIS  Technique: Both transabdominal and transvaginal ultrasound  examinations of the pelvis were performed. Transabdominal technique  was performed for global imaging  of the pelvis including uterus,  ovaries, adnexal regions, and pelvic cul-de-sac.  It was necessary to proceed with endovaginal exam following the  transabdominal exam to visualize the uterus and ovaries in greater  detail.  Comparison: Pelvic ultrasound performed 11/26/2008  Findings:  Uterus: Normal in size, measuring 8.8 x 4.9 x 5.2 cm. There is  heterogeneity of the myometrium at the fundus, which could reflect  adenomyosis.  Endometrium: Normal in thickness and appearance, though difficult  to fully characterize; measures 0.3 cm in thickness.  Right ovary: Normal appearance/no adnexal  mass; measures 3.1 x 2.1  x 2.6 cm.  Left ovary: Normal appearance/no adnexal mass; measures 3.7 x 2.0 x  2.0 cm.  Other findings: No free fluid is seen within the pelvic cul-de-sac.  IMPRESSION:  Heterogeneity of the myometrium noted at the uterine fundus; this  could reflect mild adenomyosis. The uterus is otherwise  unremarkable in appearance. The ovaries are within normal limits.  Original Report Authenticated By: Tonia Ghent, M.D.  No acute findings.  Pain improved with Toradol. Home with Provera. F/U with Dr. Senaida Ores in clinic next week.  Napoleon Form, MD 05/14/2012 10:22 PM

## 2012-05-14 NOTE — MAU Note (Signed)
Was seen Tues for back pain and no period. Did urine and negative for pregnancy. Still haven't started my cycle. Having bad abdominal cramps, back pain , headache

## 2012-05-16 LAB — URINE CULTURE

## 2012-09-05 ENCOUNTER — Inpatient Hospital Stay (HOSPITAL_COMMUNITY)
Admission: AD | Admit: 2012-09-05 | Discharge: 2012-09-05 | Disposition: A | Discharge status: Home / Self Care | Payer: Medicaid Other | Source: Ambulatory Visit | Attending: Obstetrics and Gynecology | Admitting: Obstetrics and Gynecology

## 2012-09-05 ENCOUNTER — Inpatient Hospital Stay (HOSPITAL_COMMUNITY): Payer: Medicaid Other

## 2012-09-05 ENCOUNTER — Encounter (HOSPITAL_COMMUNITY): Payer: Self-pay

## 2012-09-05 DIAGNOSIS — R109 Unspecified abdominal pain: Secondary | ICD-10-CM | POA: Insufficient documentation

## 2012-09-05 DIAGNOSIS — N8300 Follicular cyst of ovary, unspecified side: Secondary | ICD-10-CM

## 2012-09-05 DIAGNOSIS — N83209 Unspecified ovarian cyst, unspecified side: Secondary | ICD-10-CM | POA: Insufficient documentation

## 2012-09-05 DIAGNOSIS — Z3490 Encounter for supervision of normal pregnancy, unspecified, unspecified trimester: Secondary | ICD-10-CM

## 2012-09-05 DIAGNOSIS — O34599 Maternal care for other abnormalities of gravid uterus, unspecified trimester: Secondary | ICD-10-CM

## 2012-09-05 LAB — URINALYSIS, ROUTINE W REFLEX MICROSCOPIC
Bilirubin Urine: NEGATIVE
Glucose, UA: NEGATIVE mg/dL
Hgb urine dipstick: NEGATIVE
Ketones, ur: NEGATIVE mg/dL
Nitrite: NEGATIVE
Protein, ur: NEGATIVE mg/dL
Specific Gravity, Urine: 1.025 (ref 1.005–1.030)
Urobilinogen, UA: 0.2 mg/dL (ref 0.0–1.0)
pH: 5.5 (ref 5.0–8.0)

## 2012-09-05 LAB — HCG, QUANTITATIVE, PREGNANCY: hCG, Beta Chain, Quant, S: 3128 m[IU]/mL — ABNORMAL HIGH (ref <5)

## 2012-09-05 LAB — URINE MICROSCOPIC-ADD ON

## 2012-09-05 MED ORDER — HYDROMORPHONE HCL PF 2 MG/ML IJ SOLN: 2.0000 mg | Freq: Once | INTRAMUSCULAR | Status: DC

## 2012-09-05 NOTE — MAU Note (Signed)
Patient is in with c/o 2 positive home pregnancy test and lower abdominal cramping. She denies vaginal bleeding.

## 2012-09-05 NOTE — MAU Note (Signed)
Home UPT positive, cramping x 3 wks, March 6, LMP, comes and goes and intense starting yesterday, no bleeding

## 2012-09-05 NOTE — MAU Provider Note (Signed)
None     Chief Complaint:  Abdominal Pain cramping in lower abdomen for a few days.  Has not had an ultrasound with this pregnancy  NICKI FURLAN is  32 y.o. G5P3010 at [redacted]w[redacted]d presents complaining of Abdominal Pain Cramping Obstetrical/Gynecological History: Menstrual History: OB History   Grav Para Term Preterm Abortions TAB SAB Ect Mult Living   5 3 3  1   1          Patient's last menstrual period was 07/21/2012.     Past Medical History: Past Medical History  Diagnosis Date  . Asthma   . Migraine     Past Surgical History: Past Surgical History  Procedure Laterality Date  . Tympanostomy tube placement    . Hernia repair    . Tonsillectomy    . Adenoidectomy      Family History: No family history on file.  Social History: History  Substance Use Topics  . Smoking status: Never Smoker   . Smokeless tobacco: Not on file  . Alcohol Use: No    Allergies:  Allergies  Allergen Reactions  . Amoxicillin Hives  . Latex Other (See Comments)    Burns and blisters skin  . Penicillins Hives    Meds:  Prescriptions prior to admission  Medication Sig Dispense Refill  . acetaminophen (TYLENOL) 500 MG tablet Take 1,000 mg by mouth every 6 (six) hours as needed. cramping      . albuterol (PROVENTIL HFA;VENTOLIN HFA) 108 (90 BASE) MCG/ACT inhaler Inhale 2 puffs into the lungs every 6 (six) hours as needed. For shortness of breath       . Fluticasone-Salmeterol (ADVAIR) 250-50 MCG/DOSE AEPB Inhale 1 puff into the lungs every 12 (twelve) hours.        Marland Kitchen ibuprofen (ADVIL,MOTRIN) 600 MG tablet Take 1 tablet (600 mg total) by mouth every 6 (six) hours as needed for pain.  30 tablet  1  . medroxyPROGESTERone (PROVERA) 10 MG tablet Take 1 tablet (10 mg total) by mouth daily. Use for ten days  5 tablet  0  . traMADol (ULTRAM) 50 MG tablet Take 1 tablet (50 mg total) by mouth every 6 (six) hours as needed for pain.  10 tablet  0    Review of Systems -   Review of Systems   Constitutional: Negative for fever, chills, weight loss, malaise/fatigue and diaphoresis.  HENT: Negative for hearing loss, ear pain, nosebleeds, congestion, sore throat, neck pain, tinnitus and ear discharge.   Eyes: Negative for blurred vision, double vision, photophobia, pain, discharge and redness.  Respiratory: Negative for cough, hemoptysis, sputum production, shortness of breath, wheezing and stridor.   Cardiovascular: Negative for chest pain, palpitations, orthopnea,  leg swelling  Gastrointestinal:Negative for heartburn, nausea, vomiting, diarrhea, constipation, blood in stool Genitourinary: Negative for dysuria, urgency, frequency, hematuria and flank pain.  Musculoskeletal: Negative for myalgias, back pain, joint pain and falls.  Skin: Negative for itching and rash.  Neurological: Negative for dizziness, tingling, tremors, sensory change, speech change, focal weakness, seizures, loss of consciousness, weakness and headaches.  Endo/Heme/Allergies: Negative for environmental allergies and polydipsia. Does not bruise/bleed easily.  Psychiatric/Behavioral: Negative for depression, suicidal ideas, hallucinations, memory loss and substance abuse. The patient is not nervous/anxious and does not have insomnia.      Physical Exam  Blood pressure 117/60, pulse 106, temperature 98.3 F (36.8 C), resp. rate 18, height 5\' 1"  (1.549 m), weight 253 lb (114.76 kg), last menstrual period 07/21/2012, SpO2 100.00%. GENERAL: Well-developed, well-nourished female  in no acute distress.  LUNGS: Clear to auscultation bilaterally.  HEART: Regular rate and rhythm. ABDOMEN: Soft, nontender, nondistended, gravid.  PELVIC:  deferred EXTREMITIES: Nontender, no edema, 2+ distal pulses.   Labs: Results for orders placed during the hospital encounter of 09/05/12 (from the past 24 hour(s))  URINALYSIS, ROUTINE W REFLEX MICROSCOPIC   Collection Time    09/05/12  6:55 PM      Result Value Range   Color,  Urine YELLOW  YELLOW   APPearance CLEAR  CLEAR   Specific Gravity, Urine 1.025  1.005 - 1.030   pH 5.5  5.0 - 8.0   Glucose, UA NEGATIVE  NEGATIVE mg/dL   Hgb urine dipstick NEGATIVE  NEGATIVE   Bilirubin Urine NEGATIVE  NEGATIVE   Ketones, ur NEGATIVE  NEGATIVE mg/dL   Protein, ur NEGATIVE  NEGATIVE mg/dL   Urobilinogen, UA 0.2  0.0 - 1.0 mg/dL   Nitrite NEGATIVE  NEGATIVE   Leukocytes, UA SMALL (*) NEGATIVE  URINE MICROSCOPIC-ADD ON   Collection Time    09/05/12  6:55 PM      Result Value Range   Squamous Epithelial / LPF FEW (*) RARE   WBC, UA 3-6  <3 WBC/hpf   RBC / HPF 0-2  <3 RBC/hpf   Bacteria, UA FEW (*) RARE  POCT PREGNANCY, URINE   Collection Time    09/05/12  7:22 PM      Result Value Range   Preg Test, Ur POSITIVE (*) NEGATIVE   Imaging Studies:  RADIOLOGY REPORT*   Clinical Data: Crampy pain.  6 weeks 4 days pregnant by last menstrual period.   OBSTETRIC <14 WK [place] AND TRANSVAGINAL OB US   Technique:  Both transabdominal and transvaginal ultrasound examinations were performed for complete evaluation of the gestation as well as the maternal uterus, adnexal regions, and pelvic cul-de-sac.  Transvaginal technique was performed to assess early pregnancy.   Comparison:  Pelvic ultrasound 05/14/2012.   Intrauterine gestational sac:  Present Yolk sac: Absent Embryo: Absent Cardiac Activity: Not applicable   MSD: 7 mm  5 w 3 d [place] EDC: 05/03/2013   Maternal uterus/adnexae: No evidence of subchorionic hemorrhage.   4.2 cm simple appearing cyst in the right ovary.   Left ovary normal in appearance.   No significant free fluid.   IMPRESSION:   1.  Intrauterine fluid collection which likely represents an early gestational sac.  This measures 7 mm, corresponding to 5 weeks 3 days. 2.  Right ovarian simple appearing cyst of 4.2 cm.     Assessment: NUR RABOLD is  32 y.o. G5P3010 at [redacted]w[redacted]d presents with probable early IUP.  Plan: Plans to see Dr.  Senaida Ores for [facility].  To f/u with her  CRESENZO-DISHMAN,Del Overfelt 4/21/20147:47 PM

## 2012-09-06 LAB — URINE CULTURE: Colony Count: NO GROWTH

## 2012-09-08 NOTE — MAU Provider Note (Signed)
Attestation of Attending Supervision of Advanced Practitioner (CNM/NP): Evaluation and management procedures were performed by the Advanced Practitioner under my supervision and collaboration.  I have reviewed the Advanced Practitioner's note and chart, and I agree with the management and plan.  Damon Hargrove 09/08/2012 9:25 PM  

## 2012-09-27 ENCOUNTER — Encounter (HOSPITAL_COMMUNITY): Payer: Self-pay | Admitting: Emergency Medicine

## 2012-09-27 ENCOUNTER — Emergency Department (INDEPENDENT_AMBULATORY_CARE_PROVIDER_SITE_OTHER)
Admission: EM | Admit: 2012-09-27 | Discharge: 2012-09-27 | Disposition: A | Discharge status: Home / Self Care | Payer: Self-pay | Source: Home / Self Care | Attending: Emergency Medicine | Admitting: Emergency Medicine

## 2012-09-27 DIAGNOSIS — J029 Acute pharyngitis, unspecified: Secondary | ICD-10-CM

## 2012-09-27 LAB — POCT RAPID STREP A: Streptococcus, Group A Screen (Direct): NEGATIVE

## 2012-09-27 MED ORDER — HYDROCODONE-ACETAMINOPHEN 5-325 MG PO TABS
ORAL_TABLET | ORAL | Status: AC
  Filled 2012-09-27: qty 1

## 2012-09-27 MED ORDER — HYDROCODONE-ACETAMINOPHEN 5-325 MG PO TABS
1.0000 | ORAL_TABLET | Freq: Once | ORAL | Status: AC
  Administered 2012-09-27: 1 via ORAL

## 2012-09-27 MED ORDER — ONDANSETRON 8 MG PO TBDP: 8.0000 mg | ORAL_TABLET | Freq: Three times a day (TID) | ORAL | Status: DC | PRN

## 2012-09-27 MED ORDER — HYDROCODONE-ACETAMINOPHEN 5-325 MG PO TABS: ORAL_TABLET | ORAL | Status: DC

## 2012-09-27 NOTE — ED Provider Notes (Signed)
Chief Complaint:   Chief Complaint  Patient presents with  . Sore Throat    pt is currently [redacted] wks pregnant and is complaining of a severe sore throat, headache, nausea, and chills    History of Present Illness:   [name] is a 32 year old female who is [redacted] weeks pregnant. Since yesterday she's had sore throat and felt chilled. She's felt tired and rundown, nauseated, hot and cold, and had a mild cough. She denies headache, nasal congestion, rhinorrhea, wheezing, chest pain, or GI symptoms. She has not been exposed to strep.  Review of Systems:  Other than as noted above, the patient denies any of the following symptoms. Systemic:  No fever, chills, sweats, fatigue, myalgias, headache, or anorexia. Eye:  No redness, pain or drainage. ENT:  No earache, ear congestion, nasal congestion, sneezing, rhinorrhea, sinus pressure, sinus pain, or post nasal drip. Lungs:  No cough, sputum production, wheezing, shortness of breath, or chest pain. GI:  No abdominal pain, nausea, vomiting, or diarrhea. Skin:  No rash or itching.  PMFSH:  Past medical history, family history, social history, meds, allergies, and nurse's notes were reviewed.  There is no known exposure to strep or mono.  No prior history of step or mono.  The patient denies use of tobacco. She is allergic to penicillin and amoxicillin. She has asthma and uses albuterol as needed.  Physical Exam:   Vital signs:  BP 89/69  Pulse 73  Temp(Src) 98.4 F (36.9 C) (Oral)  Resp 16  SpO2 100%  LMP 07/21/2012 General:  Alert, in no distress. Eye:  No conjunctival injection or drainage. Lids were normal. ENT:  TMs and canals were normal, without erythema or inflammation.  Nasal mucosa was clear and uncongested, without drainage.  Mucous membranes were moist.  Exam of pharynx is completely normal and reveals no erythema , ulceration, or exudate.  There were no oral ulcerations or lesions. Neck:  Supple, no adenopathy, tenderness or  mass. Lungs:  No respiratory distress.  Lungs were clear to auscultation, without wheezes, rales or rhonchi.  Breath sounds were clear and equal bilaterally.  Heart:  Regular rhythm, without gallops, murmers or rubs. Skin:  Clear, warm, and dry, without rash or lesions.  Labs:   Results for orders placed during the hospital encounter of 09/27/12  POCT RAPID STREP A ([facility] ONLY)      Result Value Range   Streptococcus, Group A Screen (Direct) NEGATIVE  NEGATIVE    Course in Urgent Care Center:   For the pain she was given Norco 5/325.  Assessment:  The encounter diagnosis was Viral pharyngitis.  Her blood pressure is somewhat low and this may be due to poor by mouth intake. She was encouraged to get as much fluid as possible, and to have her blood pressure rechecked next week.  Plan:   1.  The following meds were prescribed:   Discharge Medication List as of 09/27/2012  6:12 PM    START taking these medications   Details  HYDROcodone-acetaminophen (NORCO/VICODIN) 5-325 MG per tablet 1 to 2 tabs every 4 to 6 hours as needed for pain., Print    ondansetron (ZOFRAN ODT) 8 MG disintegrating tablet Take 1 tablet (8 mg total) by mouth every 8 (eight) hours as needed for nausea., Starting 09/27/2012, Until Discontinued, Normal       2.  The patient was instructed in symptomatic care including hot saline gargles, throat lozenges, infectious precautions, and need to trade out toothbrush.  Handouts were given. 3.  The patient was told to return if becoming worse in any way, if no better in 3 or 4 days, and given some red flag symptoms such as worsening sore throat, fever, dizziness, or weakness that would indicate earlier return. 4.  Follow up with her obstetrician next week.    Reuben Likes, MD 09/27/12 2045

## 2012-09-27 NOTE — ED Notes (Signed)
Pt states feeling very fatigue and sluggish.

## 2012-09-27 NOTE — ED Notes (Signed)
Pt is c/o severe sore throat, headache, nausea, chills. Denies fever, vomiting and diarrhea. Pt is currently 8 wks. Pt has only taken tylenol with mild relief.  Symptoms present since yesterday.

## 2012-10-26 LAB — OB RESULTS CONSOLE ABO/RH: RH Type: POSITIVE

## 2012-10-26 LAB — OB RESULTS CONSOLE HEPATITIS B SURFACE ANTIGEN: Hepatitis B Surface Ag: NEGATIVE

## 2012-10-26 LAB — OB RESULTS CONSOLE GC/CHLAMYDIA: Chlamydia: NEGATIVE

## 2013-03-10 ENCOUNTER — Encounter (HOSPITAL_COMMUNITY): Payer: Self-pay | Admitting: Emergency Medicine

## 2013-03-10 ENCOUNTER — Emergency Department (HOSPITAL_COMMUNITY)
Admission: EM | Admit: 2013-03-10 | Discharge: 2013-03-10 | Disposition: A | Discharge status: Home / Self Care | Payer: Medicaid Other | Source: Home / Self Care | Attending: Family Medicine | Admitting: Family Medicine

## 2013-03-10 DIAGNOSIS — J069 Acute upper respiratory infection, unspecified: Secondary | ICD-10-CM

## 2013-03-10 DIAGNOSIS — H6123 Impacted cerumen, bilateral: Secondary | ICD-10-CM

## 2013-03-10 DIAGNOSIS — H6120 Impacted cerumen, unspecified ear: Secondary | ICD-10-CM

## 2013-03-10 LAB — POCT RAPID STREP A: Streptococcus, Group A Screen (Direct): NEGATIVE

## 2013-03-10 LAB — OB RESULTS CONSOLE GBS: GBS: NEGATIVE

## 2013-03-10 NOTE — ED Provider Notes (Signed)
CSN: [id number]     Arrival date & time 03/10/13  4098 History   First MD Initiated Contact with Patient 03/10/13 [phone]     Chief Complaint  Patient presents with  . Sore Throat   (Consider location/radiation/quality/duration/timing/severity/associated sxs/prior Treatment) Patient is a 32 y.o. female presenting with pharyngitis. The history is provided by the patient.  Sore Throat This is a new problem. The current episode started 2 days ago. The problem has not changed since onset.Pertinent negatives include no chest pain and no abdominal pain. The symptoms are aggravated by swallowing.    Past Medical History  Diagnosis Date  . Asthma   . Migraine    Past Surgical History  Procedure Laterality Date  . Tympanostomy tube placement    . Hernia repair    . Tonsillectomy    . Adenoidectomy     History reviewed. No pertinent family history. History  Substance Use Topics  . Smoking status: Never Smoker   . Smokeless tobacco: Not on file  . Alcohol Use: No   OB History   Grav Para Term Preterm Abortions TAB SAB Ect Mult Living   5 3 3  1   1        Review of Systems  Constitutional: Negative.   HENT: Positive for congestion, ear discharge, ear pain, postnasal drip and rhinorrhea.   Respiratory: Positive for cough.   Cardiovascular: Negative for chest pain.  Gastrointestinal: Negative.  Negative for abdominal pain.    Allergies  Amoxicillin; Latex; and Penicillins  Home Medications   Current Outpatient Rx  Name  Route  Sig  Dispense  Refill  . acetaminophen (TYLENOL) 500 MG tablet   Oral   Take 1,000 mg by mouth every 6 (six) hours as needed. cramping         . albuterol (PROVENTIL HFA;VENTOLIN HFA) 108 (90 BASE) MCG/ACT inhaler   Inhalation   Inhale 2 puffs into the lungs every 6 (six) hours as needed. For shortness of breath          . HYDROcodone-acetaminophen (NORCO/VICODIN) 5-325 MG per tablet      1 to 2 tabs every 4 to 6 hours as needed for pain.   20  tablet   0   . ondansetron (ZOFRAN ODT) 8 MG disintegrating tablet   Oral   Take 1 tablet (8 mg total) by mouth every 8 (eight) hours as needed for nausea.   20 tablet   0    BP 112/64  Pulse 89  Temp(Src) 98.2 F (36.8 C) (Oral)  Resp 18  SpO2 100%  LMP 07/21/2012 Physical Exam  Nursing note and vitals reviewed. Constitutional: She is oriented to person, place, and time. She appears well-developed and well-nourished.  HENT:  Head: Normocephalic.  Ears:  Nose: Mucosal edema and rhinorrhea present.  Mouth/Throat: Oropharynx is clear and moist.  Neck: Normal range of motion. Neck supple.  Cardiovascular: Normal rate, regular rhythm, normal heart sounds and intact distal pulses.   Pulmonary/Chest: Effort normal and breath sounds normal.  Abdominal: Soft. Bowel sounds are normal. There is no tenderness.  Gravid .  Lymphadenopathy:    She has no cervical adenopathy.  Neurological: She is alert and oriented to person, place, and time.  Skin: Skin is warm and dry.    ED Course  Procedures (including critical care time) Labs Review Labs Reviewed  CULTURE, GROUP A STREP  POCT RAPID STREP A ([facility] ONLY)   Imaging Review No results found.  MDM  Strep neg,  Has had flu shot this season.  Sx improved with ear irrig, tms wnl.    Linna Hoff, MD 03/18/13 [phone]

## 2013-03-10 NOTE — ED Notes (Signed)
Pt  Reports  Symptoms  Of  sorethroat            With  Congestion      Stuffy  Nose                  Symptoms            X    2  Days

## 2013-03-12 LAB — CULTURE, GROUP A STREP

## 2013-04-19 ENCOUNTER — Telehealth (HOSPITAL_COMMUNITY): Payer: Self-pay | Admitting: *Deleted

## 2013-04-19 ENCOUNTER — Encounter (HOSPITAL_COMMUNITY): Payer: Self-pay | Admitting: *Deleted

## 2013-04-19 NOTE — Telephone Encounter (Signed)
Preadmission screen  

## 2013-04-21 ENCOUNTER — Encounter (HOSPITAL_COMMUNITY): Payer: Self-pay | Admitting: *Deleted

## 2013-04-21 ENCOUNTER — Inpatient Hospital Stay (HOSPITAL_COMMUNITY)
Admission: AD | Admit: 2013-04-21 | Discharge: 2013-04-23 | DRG: 775 | Disposition: A | Discharge status: Home / Self Care | Payer: Medicaid Other | Source: Ambulatory Visit | Attending: Obstetrics and Gynecology | Admitting: Obstetrics and Gynecology

## 2013-04-21 DIAGNOSIS — O99892 Other specified diseases and conditions complicating childbirth: Secondary | ICD-10-CM | POA: Diagnosis present

## 2013-04-21 DIAGNOSIS — N361 Urethral diverticulum: Secondary | ICD-10-CM | POA: Diagnosis present

## 2013-04-21 DIAGNOSIS — O4290 Premature rupture of membranes, unspecified as to length of time between rupture and onset of labor, unspecified weeks of gestation: Principal | ICD-10-CM | POA: Diagnosis present

## 2013-04-21 LAB — CBC
Hemoglobin: 11.2 g/dL — ABNORMAL LOW (ref 12.0–15.0)
MCH: 25.9 pg — ABNORMAL LOW (ref 26.0–34.0)
MCHC: 33.2 g/dL (ref 30.0–36.0)
MCV: 77.8 fL — ABNORMAL LOW (ref 78.0–100.0)
Platelets: 219 10*3/uL (ref 150–400)
RBC: 4.33 MIL/uL (ref 3.87–5.11)
RDW: 14.1 % (ref 11.5–15.5)

## 2013-04-21 LAB — POCT FERN TEST: POCT Fern Test: POSITIVE

## 2013-04-21 MED ORDER — IBUPROFEN 600 MG PO TABS
600.0000 mg | ORAL_TABLET | Freq: Four times a day (QID) | ORAL | Status: DC | PRN
  Administered 2013-04-22: 600 mg via ORAL

## 2013-04-21 MED ORDER — ACETAMINOPHEN 325 MG PO TABS: 650.0000 mg | ORAL_TABLET | ORAL | Status: DC | PRN

## 2013-04-21 MED ORDER — OXYCODONE-ACETAMINOPHEN 5-325 MG PO TABS: 1.0000 | ORAL_TABLET | ORAL | Status: DC | PRN

## 2013-04-21 MED ORDER — LACTATED RINGERS IV SOLN
500.0000 mL | INTRAVENOUS | Status: DC | PRN
  Administered 2013-04-22: 1000 mL via INTRAVENOUS
  Administered 2013-04-22: 500 mL via INTRAVENOUS

## 2013-04-21 MED ORDER — LIDOCAINE HCL (PF) 1 % IJ SOLN
30.0000 mL | INTRAMUSCULAR | Status: DC | PRN
  Filled 2013-04-21: qty 30

## 2013-04-21 MED ORDER — LACTATED RINGERS IV SOLN
INTRAVENOUS | Status: DC
  Administered 2013-04-22: 01:00:00 via INTRAVENOUS

## 2013-04-21 MED ORDER — CITRIC ACID-SODIUM CITRATE 334-500 MG/5ML PO SOLN: 30.0000 mL | ORAL | Status: DC | PRN

## 2013-04-21 MED ORDER — OXYTOCIN 40 UNITS IN LACTATED RINGERS INFUSION - SIMPLE MED
62.5000 mL/h | INTRAVENOUS | Status: DC
  Administered 2013-04-22: 62.5 mL/h via INTRAVENOUS

## 2013-04-21 MED ORDER — ONDANSETRON HCL 4 MG/2ML IJ SOLN: 4.0000 mg | Freq: Four times a day (QID) | INTRAMUSCULAR | Status: DC | PRN

## 2013-04-21 MED ORDER — FLEET ENEMA 7-19 GM/118ML RE ENEM: 1.0000 | ENEMA | RECTAL | Status: DC | PRN

## 2013-04-21 MED ORDER — OXYTOCIN BOLUS FROM INFUSION
500.0000 mL | INTRAVENOUS | Status: DC
  Administered 2013-04-22: 500 mL via INTRAVENOUS

## 2013-04-21 NOTE — MAU Note (Signed)
My water broke about 2115. Clear fld. Some mild contractions

## 2013-04-22 ENCOUNTER — Encounter (HOSPITAL_COMMUNITY): Payer: Self-pay | Admitting: *Deleted

## 2013-04-22 ENCOUNTER — Inpatient Hospital Stay (HOSPITAL_COMMUNITY): Payer: Medicaid Other | Admitting: Anesthesiology

## 2013-04-22 ENCOUNTER — Encounter (HOSPITAL_COMMUNITY): Payer: Medicaid Other | Admitting: Anesthesiology

## 2013-04-22 LAB — TYPE AND SCREEN

## 2013-04-22 LAB — RPR: RPR Ser Ql: NONREACTIVE

## 2013-04-22 MED ORDER — LACTATED RINGERS IV SOLN: 500.0000 mL | Freq: Once | INTRAVENOUS | Status: DC

## 2013-04-22 MED ORDER — FENTANYL 2.5 MCG/ML BUPIVACAINE 1/10 % EPIDURAL INFUSION (WH - ANES)
14.0000 mL/h | INTRAMUSCULAR | Status: DC | PRN
  Administered 2013-04-22: 14 mL/h via EPIDURAL
  Filled 2013-04-22: qty 125

## 2013-04-22 MED ORDER — BENZOCAINE-MENTHOL 20-0.5 % EX AERO
1.0000 "application " | INHALATION_SPRAY | CUTANEOUS | Status: DC | PRN
  Filled 2013-04-22: qty 56

## 2013-04-22 MED ORDER — ZOLPIDEM TARTRATE 5 MG PO TABS: 5.0000 mg | ORAL_TABLET | Freq: Every evening | ORAL | Status: DC | PRN

## 2013-04-22 MED ORDER — PRENATAL MULTIVITAMIN CH
1.0000 | ORAL_TABLET | Freq: Every day | ORAL | Status: DC
  Administered 2013-04-22: 1 via ORAL
  Filled 2013-04-22: qty 1

## 2013-04-22 MED ORDER — PHENYLEPHRINE 40 MCG/ML (10ML) SYRINGE FOR IV PUSH (FOR BLOOD PRESSURE SUPPORT)
80.0000 ug | PREFILLED_SYRINGE | INTRAVENOUS | Status: DC | PRN
  Filled 2013-04-22: qty 10
  Filled 2013-04-22: qty 2

## 2013-04-22 MED ORDER — IBUPROFEN 600 MG PO TABS
600.0000 mg | ORAL_TABLET | Freq: Four times a day (QID) | ORAL | Status: DC
  Administered 2013-04-22 – 2013-04-23 (×4): 600 mg via ORAL
  Filled 2013-04-22 (×5): qty 1

## 2013-04-22 MED ORDER — LANOLIN HYDROUS EX OINT: TOPICAL_OINTMENT | CUTANEOUS | Status: DC | PRN

## 2013-04-22 MED ORDER — LACTATED RINGERS IV SOLN: INTRAVENOUS | Status: DC

## 2013-04-22 MED ORDER — DIPHENHYDRAMINE HCL 50 MG/ML IJ SOLN: 12.5000 mg | INTRAMUSCULAR | Status: DC | PRN

## 2013-04-22 MED ORDER — SENNOSIDES-DOCUSATE SODIUM 8.6-50 MG PO TABS
2.0000 | ORAL_TABLET | ORAL | Status: DC
  Administered 2013-04-22: 2 via ORAL
  Filled 2013-04-22: qty 2

## 2013-04-22 MED ORDER — MEASLES, MUMPS & RUBELLA VAC ~~LOC~~ INJ
0.5000 mL | INJECTION | Freq: Once | SUBCUTANEOUS | Status: AC
  Administered 2013-04-23: 0.5 mL via SUBCUTANEOUS
  Filled 2013-04-22 (×2): qty 0.5

## 2013-04-22 MED ORDER — ALBUTEROL SULFATE HFA 108 (90 BASE) MCG/ACT IN AERS: 2.0000 | INHALATION_SPRAY | Freq: Four times a day (QID) | RESPIRATORY_TRACT | Status: DC | PRN

## 2013-04-22 MED ORDER — OXYTOCIN 40 UNITS IN LACTATED RINGERS INFUSION - SIMPLE MED
1.0000 m[IU]/min | INTRAVENOUS | Status: DC
Start: 2013-04-22 — End: 2013-04-22
  Administered 2013-04-22: 1 m[IU]/min via INTRAVENOUS
  Administered 2013-04-22: 4 m[IU]/min via INTRAVENOUS
  Administered 2013-04-22: 6 m[IU]/min via INTRAVENOUS
  Filled 2013-04-22: qty 1000

## 2013-04-22 MED ORDER — OXYCODONE-ACETAMINOPHEN 5-325 MG PO TABS
1.0000 | ORAL_TABLET | ORAL | Status: DC | PRN
  Administered 2013-04-22 (×3): 1 via ORAL
  Filled 2013-04-22 (×3): qty 1

## 2013-04-22 MED ORDER — EPHEDRINE 5 MG/ML INJ
10.0000 mg | INTRAVENOUS | Status: DC | PRN
  Filled 2013-04-22: qty 2
  Filled 2013-04-22: qty 4

## 2013-04-22 MED ORDER — DIPHENHYDRAMINE HCL 25 MG PO CAPS
25.0000 mg | ORAL_CAPSULE | Freq: Four times a day (QID) | ORAL | Status: DC | PRN
  Administered 2013-04-22: 25 mg via ORAL
  Filled 2013-04-22: qty 1

## 2013-04-22 MED ORDER — WITCH HAZEL-GLYCERIN EX PADS: 1.0000 "application " | MEDICATED_PAD | CUTANEOUS | Status: DC | PRN

## 2013-04-22 MED ORDER — OXYTOCIN 40 UNITS IN LACTATED RINGERS INFUSION - SIMPLE MED: 62.5000 mL/h | INTRAVENOUS | Status: AC | PRN

## 2013-04-22 MED ORDER — EPHEDRINE 5 MG/ML INJ
10.0000 mg | INTRAVENOUS | Status: DC | PRN
  Filled 2013-04-22: qty 2

## 2013-04-22 MED ORDER — SIMETHICONE 80 MG PO CHEW: 80.0000 mg | CHEWABLE_TABLET | ORAL | Status: DC | PRN

## 2013-04-22 MED ORDER — ONDANSETRON HCL 4 MG PO TABS: 4.0000 mg | ORAL_TABLET | ORAL | Status: DC | PRN

## 2013-04-22 MED ORDER — LIDOCAINE HCL (PF) 1 % IJ SOLN
INTRAMUSCULAR | Status: DC | PRN
  Administered 2013-04-22 (×4): 4 mL

## 2013-04-22 MED ORDER — TERBUTALINE SULFATE 1 MG/ML IJ SOLN: 0.2500 mg | Freq: Once | INTRAMUSCULAR | Status: DC | PRN

## 2013-04-22 MED ORDER — PRENATAL MULTIVITAMIN CH: 1.0000 | ORAL_TABLET | Freq: Every day | ORAL | Status: DC

## 2013-04-22 MED ORDER — TETANUS-DIPHTH-ACELL PERTUSSIS 5-2.5-18.5 LF-MCG/0.5 IM SUSP: 0.5000 mL | Freq: Once | INTRAMUSCULAR | Status: DC

## 2013-04-22 MED ORDER — DIBUCAINE 1 % RE OINT
1.0000 "application " | TOPICAL_OINTMENT | RECTAL | Status: DC | PRN
  Filled 2013-04-22: qty 28

## 2013-04-22 MED ORDER — ONDANSETRON HCL 4 MG/2ML IJ SOLN: 4.0000 mg | INTRAMUSCULAR | Status: DC | PRN

## 2013-04-22 NOTE — Progress Notes (Signed)
Patient ID: [name], female   DOB: 1981/02/19, 32 y.o.   MRN: [id number] DOD VS stable no complaints

## 2013-04-22 NOTE — Progress Notes (Signed)
Patient ID: [name], female   DOB: 1981/02/26, 32 y.o.   MRN: [id number] Pitocin is at 6 mu/ minute and pt states contractions are painful. Cervix is 3-4 cm 70 % effaced and the vertex is at - 2 station. 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 Pt requests an epidural

## 2013-04-22 NOTE — H&P (Signed)
NAMEIDELIA, CAUDELL NO.:  [id number]  MEDICAL RECORD NO.:  [id number]  LOCATION:  9163                          FACILITY:  [facility]  PHYSICIAN:  Malachi Pro. Ambrose Mantle, M.D. DATE OF BIRTH:  25-Mar-1981  DATE OF ADMISSION:  04/21/2013 DATE OF DISCHARGE:                             HISTORY & PHYSICAL   PRESENT ILLNESS:  This is a 32 year old black female, para 3-0-1-3, gravida 5, at 38 weeks and 3 days gestation, [facility] of May 03, 2013, admitted with premature rupture of the membranes.  Blood group and type AB positive, negative antibody.  Pap smear abnormal.  Rubella was not immune.  RPR nonreactive.  Urine culture negative.  Hepatitis B surface antigen negative.  HIV negative.  GC and Chlamydia negative.  First trimester screen normal.  AFP normal.  One hour Glucola 100.  Repeat HIV and serology negative and group B strep negative according to the patient.  She began her prenatal course in our office at 13 weeks and 5 days.  She was treated with erythromycin for a bullae in her vaginal area.  She requested permanent sterilization.  This was discussed in detail with her by Dr. Senaida Ores.  She leaned toward an Essure. Colposcopy done for abnormal Pap smear was benign.  At her last visit on April 18, 2013, the cervix was 2 cm, 50%.  PAST MEDICAL HISTORY:  Reveals allergy to AMOXICILLIN, LATEX and PENICILLINS.  MEDICAL HISTORY:  Headaches, asthma and seasonal allergies.  She has had a history of MRSA.  PAST SURGICAL HISTORY:  She had a TNA, hernia repair, and ear tubes.  SOCIAL HISTORY:  Never smoked.  Does not drink.  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  FAMILY HISTORY:  Mother with anemia, congestive heart failure, type 2 diabetes, high blood pressure and COPD.  Father with high blood pressure, CVA and seizures.  Maternal uncle with a malignant tumor of the lung and malignant tumor of the brain.  Maternal grandmother  with kidney disease.  The group B strep culture was negative.  PHYSICAL EXAMINATION:  VITAL SIGNS:  On admission, after the patient was admitted with a history of ruptured membranes at 9:15 p.m. and confirmed in the MAU, physical exam temperature 98.4, pulse 113, respirations 20, and blood pressure 133/85. HEART:  Normal size and sounds.  No murmurs. LUNGS:  Clear to auscultation. ABDOMEN:  Soft.  Fundal height at her last prenatal visit on April 18, 2013, was 38 cm.  Fetal heart tones are normal and per the admitting RN. PELVIS:  Cervix was 2 cm, 50%, vertex at a -2 station.  ADMITTING IMPRESSION:  Intrauterine pregnancy at 38 weeks 3 days, premature rupture of the membranes.  The patient is admitted.  She is not contracting regularly.  She will be placed on Pitocin for induction of labor.     Malachi Pro. Ambrose Mantle, M.D.     TFH/MEDQ  D:  04/22/2013  T:  04/22/2013  Job:  [id number]

## 2013-04-22 NOTE — Progress Notes (Signed)
Patient ID: [name], female   DOB: 1980-09-03, 32 y.o.   MRN: [id number] Delivery note:  The pt became fully dilated and without pushing delivered a living female infant spontaneously ROA over an intact perineum. Apgars were 9 and 9 at 1 and 5 minutes . The placenta was intact and the uterus was normal. There were no lacerations. EBL 400 cc's.

## 2013-04-22 NOTE — Anesthesia Procedure Notes (Signed)
Epidural Patient location during procedure: OB Start time: 04/22/2013 3:26 AM  Staffing Performed by: anesthesiologist   Preanesthetic Checklist Completed: patient identified, site marked, surgical consent, pre-op evaluation, timeout performed, IV checked, risks and benefits discussed and monitors and equipment checked  Epidural Patient position: sitting Prep: site prepped and draped and DuraPrep Patient monitoring: continuous pulse ox and blood pressure Approach: midline Injection technique: LOR air  Needle:  Needle type: Tuohy  Needle gauge: 17 G Needle length: 9 cm and 9 Needle insertion depth: 7 cm Catheter type: closed end flexible Catheter size: 19 Gauge Catheter at skin depth: 12 cm Test dose: negative  Assessment Events: blood not aspirated, injection not painful, no injection resistance, negative IV test and no paresthesia  Additional Notes Discussed risk of headache, infection, bleeding, nerve injury and failed or incomplete block.  Patient voices understanding and wishes to proceed. Epidural placed easily on first attempt.  No paresthesia.  Patient tolerated procedure well with no apparent complications.  Jasmine December, MDReason for block:procedure for pain

## 2013-04-22 NOTE — Anesthesia Preprocedure Evaluation (Signed)
Anesthesia Evaluation  Patient identified by MRN, date of birth, ID band Patient awake    Reviewed: Allergy & Precautions, H&P , NPO status , Patient's Chart, lab work & pertinent test results, reviewed documented beta blocker date and time   History of Anesthesia Complications Negative for: history of anesthetic complications  Airway Mallampati: II TM Distance: >3 FB Neck ROM: full    Dental  (+) Teeth Intact   Pulmonary asthma (no inhaler use during pregnancy) ,  breath sounds clear to auscultation        Cardiovascular negative cardio ROS  Rhythm:regular Rate:Normal     Neuro/Psych  Headaches, negative psych ROS   GI/Hepatic negative GI ROS, Neg liver ROS,   Endo/Other  Morbid obesity  Renal/GU negative Renal ROS  negative genitourinary   Musculoskeletal   Abdominal   Peds  Hematology negative hematology ROS (+)   Anesthesia Other Findings   Reproductive/Obstetrics (+) Pregnancy                           Anesthesia Physical Anesthesia Plan  ASA: III  Anesthesia Plan: Epidural   Post-op Pain Management:    Induction:   Airway Management Planned:   Additional Equipment:   Intra-op Plan:   Post-operative Plan:   Informed Consent: I have reviewed the patients History and Physical, chart, labs and discussed the procedure including the risks, benefits and alternatives for the proposed anesthesia with the patient or authorized representative who has indicated his/her understanding and acceptance.     Plan Discussed with:   Anesthesia Plan Comments:         Anesthesia Quick Evaluation

## 2013-04-23 LAB — CBC
HCT: 30.6 % — ABNORMAL LOW (ref 36.0–46.0)
Hemoglobin: 10 g/dL — ABNORMAL LOW (ref 12.0–15.0)
MCHC: 32.7 g/dL (ref 30.0–36.0)
MCV: 78.3 fL (ref 78.0–100.0)
RBC: 3.91 MIL/uL (ref 3.87–5.11)
RDW: 14.4 % (ref 11.5–15.5)
WBC: 7.7 10*3/uL (ref 4.0–10.5)

## 2013-04-23 MED ORDER — OXYCODONE-ACETAMINOPHEN 5-325 MG PO TABS: 1.0000 | ORAL_TABLET | Freq: Four times a day (QID) | ORAL | Status: DC | PRN

## 2013-04-23 MED ORDER — MEDROXYPROGESTERONE ACETATE 150 MG/ML IM SUSP
150.0000 mg | Freq: Once | INTRAMUSCULAR | Status: AC
  Administered 2013-04-23: 150 mg via INTRAMUSCULAR
  Filled 2013-04-23: qty 1

## 2013-04-23 MED ORDER — IBUPROFEN 600 MG PO TABS: 600.0000 mg | ORAL_TABLET | Freq: Four times a day (QID) | ORAL | Status: DC | PRN

## 2013-04-23 NOTE — Anesthesia Postprocedure Evaluation (Signed)
  Anesthesia Post-op Note  Anesthesia Post Note  Patient: [name]  Procedure(s) Performed: * No procedures listed *  Anesthesia type: Epidural  Patient location: Mother/Baby  Post pain: Pain level controlled  Post assessment: Post-op Vital signs reviewed  Last Vitals:  Filed Vitals:   04/23/13 0510  BP: 138/81  Pulse: 108  Temp: 36.4 C  Resp: 18    Post vital signs: Reviewed  Level of consciousness:alert  Complications: No apparent anesthesia complications

## 2013-04-23 NOTE — Progress Notes (Signed)
Patient ID: [name], female   DOB: January 25, 1981, 32 y.o.   MRN: [id number] #1 afebrile BP normal Pt requesrs d/c

## 2013-04-24 NOTE — Discharge Summary (Signed)
[name], [name] NO.:  [id number]  MEDICAL RECORD NO.:  [id number]  LOCATION:  9124                          FACILITY:  [facility]  PHYSICIAN:  Malachi Pro. Ambrose Mantle, M.D. DATE OF BIRTH:  03-21-1981  DATE OF ADMISSION:  04/21/2013 DATE OF DISCHARGE:  04/23/2013                              DISCHARGE SUMMARY   HOSPITAL COURSE:  A 32 year old black female, para 3-0-1-3, gravida 5, admitted with premature rupture of the membranes.  The patient's labs were normal except for an abnormal Pap smear.  She requested sterilization and was planning on doing that in the office.  The colposcopy for the abnormal Pap smear was benign.  On admission, her cervix was 2 cm, 50%.  She was placed on Pitocin.  Contractions became painful.  By 2:50 a.m., the cervix was 3 to 4 cm.  She did feel like she had an urethral diverticulum.  She received an epidural, became fully dilated and without pushing, delivered a living female infant spontaneously ROA over an intact perineum by Dr. Ambrose Mantle.  Apgars were 9 and 9 at 1 and 5 minutes.  Placenta was intact.  Uterus normal.  No lacerations.  Blood loss about 400 mL.  Postpartum, the patient did well.  Requested early discharge and was discharged on the first postpartum day.  Initial hemoglobin 11.2, hematocrit 33.7, white count 9700, platelet count 219,000.  RPR was nonreactive.  Followup hemoglobin 10.0.  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  The patient was discharged with Percocet 5/325, 30 tablets and Motrin 600 mg 30 tablets, one of each every 6 hours as needed for pain.  She was given a shot of Depo-Provera 150 mg before discharge.  She was advised to return to the office in 6 weeks to see Dr. Senaida Ores to consider the Essure sterilization.  She was given a copy of our discharge instruction booklet as well as the after visit  summary.     Malachi Pro. Ambrose Mantle, M.D.     TFH/MEDQ  D:  04/23/2013  T:  04/24/2013  Job:  [id number]

## 2013-04-24 NOTE — Progress Notes (Signed)
Ur chart review completed.  

## 2013-04-26 ENCOUNTER — Inpatient Hospital Stay (HOSPITAL_COMMUNITY): Admission: RE | Admit: 2013-04-26 | Payer: Medicaid Other | Source: Ambulatory Visit

## 2013-05-19 NOTE — Progress Notes (Signed)
Spoke with Ms Gaspar Skeeterslatt by phone at [facility] request regarding her headache and neurologic symptoms.  She had a vaginal delivery of her fourth baby on 04/22/13 (4 weeks ago) and had an epidural placed for approximately 1.5 hrs prior to delivery.  She reports no problems with the epidural, and my notes indicate an uneventful placement.  She began to notice a sharp pain starting in her neck and extending to her chest and head lasting approximately 30-60 seconds occurring several times per day on about her second or third day home from the hospital.  She also started having daily headaches at about the same time.  The headaches are somewhat positional (improve with lying down), and respond to percocet, but not ibuprofen.  She has noticed increased thirst, but increased PO intake of fluids has not affected the headaches.  She also noticed intermittent numbness of both arms that will resolve leaving her arms painful and tender to the touch. She denies visual changes.  She reports similar neck pain/headache which lasted for approximately two weeks after her last delivery and eventually resolved spontaneously.  I discussed with her the natural course of post-dural puncture headache and the things about her case that go against it 1) she had an epidural and there was no noted dural puncture, 2) PDPH usually resolves spontaneously in 7 days, 3) she has additional symptoms not consistent with PDPH.  I offered the patient epidural blood patch and also recommended neurology evaluation.  The patient voiced understanding and appreciation and elects to seek neurology evaluation prior to undergoing EBP.

## 2013-05-19 NOTE — Addendum Note (Signed)
Addendum created 05/19/13 1743 by Dana AllanAmy Danialle Dement, MD   Modules edited: Clinical Notes   Clinical Notes:  File: [id number]; Pend: [id number]; Pend: [id number]; Pend: [id number]

## 2013-05-29 ENCOUNTER — Encounter: Payer: Self-pay | Admitting: Neurology

## 2013-05-29 ENCOUNTER — Encounter (INDEPENDENT_AMBULATORY_CARE_PROVIDER_SITE_OTHER): Payer: Self-pay

## 2013-05-29 ENCOUNTER — Ambulatory Visit (INDEPENDENT_AMBULATORY_CARE_PROVIDER_SITE_OTHER): Payer: Medicaid Other | Admitting: Neurology

## 2013-05-29 VITALS — BP 147/98 | HR 106 | Ht 62.0 in | Wt 255.0 lb

## 2013-05-29 DIAGNOSIS — R5383 Other fatigue: Secondary | ICD-10-CM

## 2013-05-29 DIAGNOSIS — R5381 Other malaise: Secondary | ICD-10-CM

## 2013-05-29 DIAGNOSIS — R531 Weakness: Secondary | ICD-10-CM

## 2013-05-29 DIAGNOSIS — R51 Headache: Secondary | ICD-10-CM

## 2013-05-29 MED ORDER — PREGABALIN 75 MG PO CAPS: 75.0000 mg | ORAL_CAPSULE | Freq: Two times a day (BID) | ORAL | Status: DC

## 2013-05-29 NOTE — Progress Notes (Signed)
[facility]    Provider:  Dr Hosie PoissonSumner Referring Provider: Bing PlumeHenley, Thomas F, MD Primary Care Physician:  Oliver PilaICHARDSON,KATHY W, MD  CC:  Pain after recent delivery  HPI:  [name] is a 33 y.o. female here as a referral from Dr. Ambrose MantleHenley for pain after recent vaginal delivery. Recent delivery on December 6, developed sharp pain in lumbar region that radiates up into her head, causes some stiffness, developed aching sensation in her arms, left > right. Can also start in the cervical region and then radiate downwards. Had very similar symptoms after delivering initial child in 2011, lasted for 2 weeks and then resolved. All of this started 3 to 4 days after delivery. Arm pain is constant, the spasm episodes come and go. Headache starts occipital, radiates frontal, described as pulsating pain. Headache can last hours. No N/V. Notes some blurry vision. No LOC.   Has no episodes similar to this not associated with pregnancy.   Review of Systems: Out of a complete 14 system review, the patient complains of only the following symptoms, and all other reviewed systems are negative. Headache numbness weakness increased thirst joint pain aching muscles chest pain  History   Social History  . Marital Status: Married    Spouse Name: [place]    Number of Children: 4  . Years of Education: college   Occupational History  . TEACHER    Social History Main Topics  . Smoking status: Never Smoker   . Smokeless tobacco: Not on file  . Alcohol Use: No  . Drug Use: No  . Sexual Activity: Yes    Birth Control/ Protection: None   Other Topics Concern  . Not on file   Social History Narrative   Patient lives at home with her husband Production manager(D[place] Rohrer) and 4 children   Patient is left handed   Education some college   Caffeine consumption if any 1 a day    Family History  Problem Relation Age of Onset  . Heart disease Mother   . Asthma Mother   . COPD Mother   . Stroke Father    . Seizures Father   . Heart disease Maternal Grandmother     Past Medical History  Diagnosis Date  . Asthma   . Migraine     Past Surgical History  Procedure Laterality Date  . Tympanostomy tube placement    . Hernia repair    . Tonsillectomy    . Adenoidectomy      Current Outpatient Prescriptions  Medication Sig Dispense Refill  . albuterol (PROVENTIL HFA;VENTOLIN HFA) 108 (90 BASE) MCG/ACT inhaler Inhale 2 puffs into the lungs every 6 (six) hours as needed. For shortness of breath       . ibuprofen (ADVIL,MOTRIN) 600 MG tablet Take 1 tablet (600 mg total) by mouth every 6 (six) hours as needed.  30 tablet  0  . oxyCODONE-acetaminophen (PERCOCET/ROXICET) 5-325 MG per tablet Take 1 tablet by mouth every 6 (six) hours as needed for moderate pain.  30 tablet  0  . Prenatal Vit-Fe Fumarate-FA (PRENATAL MULTIVITAMIN) TABS tablet Take 1 tablet by mouth daily at 12 noon.       No current facility-administered medications for this visit.    Allergies as of 05/29/2013 - Review Complete 05/29/2013  Allergen Reaction Noted  . Amoxicillin Hives   . Latex Other (See Comments)   . Penicillins Hives     Vitals: BP 147/98  Pulse 106  Ht 5\' 2"  (1.575 m)  Wt 255 lb (115.667 kg)  BMI 46.63 kg/m2  Breastfeeding? Yes Last Weight:  Wt Readings from Last 1 Encounters:  05/29/13 255 lb (115.667 kg)   Last Height:   Ht Readings from Last 1 Encounters:  05/29/13 5\' 2"  (1.575 m)     Physical exam: Exam: Gen: NAD, conversant Eyes: anicteric sclerae, moist conjunctivae HENT: Atraumatic, oropharynx clear Neck: Trachea midline; supple,  Lungs: CTA, no wheezing, rales, rhonic                          CV: RRR, no MRG Abdomen: Soft, non-tender;  Extremities: No peripheral edema  Skin: Normal temperature, no rash,  Psych: Appropriate affect, pleasant  Neuro: MS: AA&Ox3, appropriately interactive, normal affect   Speech: fluent w/o paraphasic error  Memory: good recent and  remote recall  CN: PERRL, EOMI no nystagmus, no ptosis, sensation intact to LT V1-V3 bilat, face symmetric, no weakness, hearing grossly intact, palate elevates symmetrically, shoulder shrug 5/5 bilat,  tongue protrudes midline, no fasiculations noted.  Motor: normal bulk and tone Strength: 5/5  In all extremities  Coord: rapid alternating and point-to-point (FNF, HTS) movements intact.  Reflexes: symmetrical, bilat downgoing toes  Sens: LT intact in all extremities  Gait: posture, stance, stride and arm-swing normal. Tandem gait intact. Able to walk on heels and toes. Romberg absent.   Assessment:  After physical and neurologic examination, review of laboratory studies, imaging, neurophysiology testing and pre-existing records, assessment will be reviewed on the problem list.  Plan:  Treatment plan and additional workup will be reviewed under Problem List.  1)Headache 2)Cervical neck pain  32y/o woman presenting for initial evaluation of episodes of cervical neck pain, headache, subjective muscle weakness and aching since recent delivery. Unclear etiology of her symptoms. With recent delivery would have concern for possible central process, such as dural  sinus thrombosis. It is very atypical that she had similar symptoms with prior delivery. Will check brain MRI, MRA MRV and cervical MRI. Followup once imaging completed.   Elspeth Cho, DO  [facility] [address] [place], [place] [address]  Phone [phone] Fax [phone]

## 2013-05-29 NOTE — Patient Instructions (Signed)
Overall you are doing fairly well but I do want to suggest a few things today:   Remember to drink plenty of fluid, eat healthy meals and do not skip any meals. Try to eat protein with a every meal and eat a healthy snack such as fruit or nuts in between meals. Try to keep a regular sleep-wake schedule and try to exercise daily, particularly in the form of walking, 20-30 minutes a day, if you can.   As far as your medications are concerned, I would like to suggest starting Lyrica 75mg  twice a day  As far as diagnostic testing:  1)MRI of the brain and cervical spine  I would like to see you back once the workup is completed, sooner if we need to. Please call us with any interim questions, concerns, problems, updates or refill requests.   My clinical assistant and will answer any of your questions and relay your messages to me and also relay most of my messages to you.   Our phone number is [phone]. We also have an after hours call service for urgent matters and there is a physician on-call for urgent questions. For any emergencies you know to call 911 or go to the nearest emergency room

## 2013-06-13 ENCOUNTER — Other Ambulatory Visit: Payer: Medicaid Other

## 2013-06-13 ENCOUNTER — Inpatient Hospital Stay
Admission: RE | Admit: 2013-06-13 | Discharge: 2013-06-13 | Disposition: A | Discharge status: Home / Self Care | Payer: Medicaid Other | Source: Ambulatory Visit | Attending: Neurology | Admitting: Neurology

## 2013-06-18 ENCOUNTER — Ambulatory Visit
Admission: RE | Admit: 2013-06-18 | Discharge: 2013-06-18 | Disposition: A | Discharge status: Home / Self Care | Payer: Medicaid Other | Source: Ambulatory Visit | Attending: Neurology | Admitting: Neurology

## 2013-06-18 DIAGNOSIS — R531 Weakness: Secondary | ICD-10-CM

## 2013-06-18 DIAGNOSIS — R51 Headache: Secondary | ICD-10-CM

## 2013-06-20 ENCOUNTER — Other Ambulatory Visit (HOSPITAL_COMMUNITY): Payer: Self-pay | Admitting: Urology

## 2013-06-20 ENCOUNTER — Telehealth: Payer: Self-pay | Admitting: Neurology

## 2013-06-20 DIAGNOSIS — N361 Urethral diverticulum: Secondary | ICD-10-CM

## 2013-06-20 NOTE — Telephone Encounter (Signed)
Called patient and made an appointment with Dr. Hosie PoissonSumner for 06-21-2013 @11 :30 am all her testing has been done.

## 2013-06-20 NOTE — Telephone Encounter (Signed)
Pt called and stating that the pain in her arm has progressed since her visit and she is in severe pain and can barely use her hand.  She states that the pain is constant and the medication dulls the pain but never relieves it completely.  She stated she almost dropped her infant it has become so bad.  Would like to get an appointment as soon as possible.  Please call.

## 2013-06-21 ENCOUNTER — Encounter: Payer: Self-pay | Admitting: Neurology

## 2013-06-21 ENCOUNTER — Ambulatory Visit (INDEPENDENT_AMBULATORY_CARE_PROVIDER_SITE_OTHER): Payer: Medicaid Other | Admitting: Neurology

## 2013-06-21 VITALS — BP 140/89 | HR 109 | Ht 62.0 in | Wt 255.0 lb

## 2013-06-21 DIAGNOSIS — M791 Myalgia, unspecified site: Secondary | ICD-10-CM

## 2013-06-21 DIAGNOSIS — IMO0001 Reserved for inherently not codable concepts without codable children: Secondary | ICD-10-CM

## 2013-06-21 NOTE — Progress Notes (Signed)
[facility]    Provider:  Dr Hosie PoissonSumner Referring Provider: Oliver [name], Melinda W, MD Primary Care Physician:  [name],Melinda W, MD  CC:  Pain after recent delivery  HPI:  [name] is a 33 y.o. female here as a follow up from Dr. Senaida Oresichardson for pain after recent vaginal delivery. Main concern is bilateral upper extremity pain, described as a sharp distal pain that spreads proximally, proximal to the elbow it becomes a more dull pain. Is a constant pain, worse when trying to do things/be active. Will wake her up overnight. Symptoms in lumbar region and legs has resolved. Currently taking Lyrica and Percocet for the pain. States she takes percocet 1-2 a day and tylenol as needed in between. She reports that the pain is severe enough that she almost dropped her infant due to the pain.   Initial visit 05/2012 Recent delivery on December 6, developed sharp pain in lumbar region that radiates up into her head, causes some stiffness, developed aching sensation in her arms, left > right. Can also start in the cervical region and then radiate downwards. Had very similar symptoms after delivering initial child in 2011, lasted for 2 weeks and then resolved. All of this started 3 to 4 days after delivery. Arm pain is constant, the spasm episodes come and go. Headache starts occipital, radiates frontal, described as pulsating pain. Headache can last hours. No N/V. Notes some blurry vision. No LOC.   Has no episodes similar to this not associated with pregnancy.   Review of Systems: Out of a complete 14 system review, the patient complains of only the following symptoms, and all other reviewed systems are negative. Positive for joint pain back pain aching muscles  History   Social History  . Marital Status: Married    Spouse Name: Melinda    Number of Children: 4  . Years of Education: college   Occupational History  . TEACHER    Social History Main Topics  . Smoking status:  Never Smoker   . Smokeless tobacco: Not on file  . Alcohol Use: No  . Drug Use: No  . Sexual Activity: Yes    Birth Control/ Protection: None   Other Topics Concern  . Not on file   Social History Narrative   Patient lives at home with her husband Production manager([name]) and 4 children   Patient is left handed   Education some college   Caffeine consumption if any 1 a day    Family History  Problem Relation Age of Onset  . Heart disease Mother   . Asthma Mother   . COPD Mother   . Stroke Father   . Seizures Father   . Heart disease Maternal Grandmother     Past Medical History  Diagnosis Date  . Asthma   . Migraine     Past Surgical History  Procedure Laterality Date  . Tympanostomy tube placement    . Hernia repair    . Tonsillectomy    . Adenoidectomy      Current Outpatient Prescriptions  Medication Sig Dispense Refill  . albuterol (PROVENTIL HFA;VENTOLIN HFA) 108 (90 BASE) MCG/ACT inhaler Inhale 2 puffs into the lungs every 6 (six) hours as needed. For shortness of breath       . ibuprofen (ADVIL,MOTRIN) 600 MG tablet Take 1 tablet (600 mg total) by mouth every 6 (six) hours as needed.  30 tablet  0  . oxyCODONE-acetaminophen (PERCOCET/ROXICET) 5-325 MG per tablet Take 1 tablet by mouth  every 6 (six) hours as needed for moderate pain.  30 tablet  0  . pregabalin (LYRICA) 75 MG capsule Take 1 capsule (75 mg total) by mouth 2 (two) times daily.  60 capsule  3  . Prenatal Vit-Fe Fumarate-FA (PRENATAL MULTIVITAMIN) TABS tablet Take 1 tablet by mouth daily at 12 noon.       No current facility-administered medications for this visit.    Allergies as of 06/21/2013 - Review Complete 06/21/2013  Allergen Reaction Noted  . Amoxicillin Hives   . Latex Other (See Comments)   . Penicillins Hives     Vitals: BP 140/89  Pulse 109  Ht 5\' 2"  (1.575 m)  Wt 255 lb (115.667 kg)  BMI 46.63 kg/m2  Breastfeeding? No Last Weight:  Wt Readings from Last 1 Encounters:    06/21/13 255 lb (115.667 kg)   Last Height:   Ht Readings from Last 1 Encounters:  06/21/13 5\' 2"  (1.575 m)     Physical exam: Exam: Gen: NAD, conversant Eyes: anicteric sclerae, moist conjunctivae HENT: Atraumatic, oropharynx clear Neck: Trachea midline; supple,  Lungs: CTA, no wheezing, rales, rhonic                          CV: RRR, no MRG Abdomen: Soft, non-tender;  Extremities: No peripheral edema  Skin: Normal temperature, no rash,  Psych: Appropriate affect, pleasant  Neuro: MS: AA&Ox3, appropriately interactive, normal affect   Speech: fluent w/o paraphasic error  Memory: good recent and remote recall  CN: PERRL, EOMI no nystagmus, no ptosis, sensation intact to LT V1-V3 bilat, face symmetric, no weakness, hearing grossly intact, palate elevates symmetrically, shoulder shrug 5/5 bilat,  tongue protrudes midline, no fasiculations noted.  Motor: normal bulk and tone Strength: 5/5  In all extremities with giveaway weakness noted in bilateral UE (patient reports severe pain with muscle activation)  Coord: rapid alternating and point-to-point (FNF, HTS) movements intact.  Reflexes: symmetrical, bilat downgoing toes  Sens: LT intact in all extremities + Phalen's sign, negative Tinel's  Gait: posture, stance, stride and arm-swing normal. Tandem gait intact. Able to walk on heels and toes. Romberg absent.   Assessment:  After physical and neurologic examination, review of laboratory studies, imaging, neurophysiology testing and pre-existing records, assessment will be reviewed on the problem list.  Plan:  Treatment plan and additional workup will be reviewed under Problem List.  1)Upper extremity pain 2)Cervical neck pain  32y/o woman presenting for follow up  evaluation of episodes of cervical neck pain, headache, subjective muscle weakness and aching since recent vaginal delivery. Since last visit she has had a MRI/A/V of brain and MRI C spine all of which  were unremarkable. Headache and lumbar/LE pain has resolved, currently now reports severe bilateral UE distal>proximal pain. Unclear etiology of her symptoms. Will check EMG/NCS and lab work for CK, ANA. Increase Lyrica to 125mg  BID. Based on giveaway weakness and overall history/exam, question if there is some functional overlay to her exam. Follow up once EMG/NCS completed.   Elspeth Cho, DO  [facility] [address] [place], [place] [address]  Phone [phone] Fax [phone]

## 2013-06-21 NOTE — Patient Instructions (Signed)
Overall you are doing fairly well but I do want to suggest a few things today:   As far as your medications are concerned, I would like to suggest the following: 1)Please increase the lyrica to 125mg  twice a day (you will take a 75mg  and a 50mg  capsule twice a day)  As far as diagnostic testing:  1)Please schedule an EMG/NCS upon checking out 2)Please have some blood work done upon checking out  Please also call us for any test results so we can go over those with you on the phone.  My clinical assistant and will answer any of your questions and relay your messages to me and also relay most of my messages to you.   Our phone number is [phone]. We also have an after hours call service for urgent matters and there is a physician on-call for urgent questions. For any emergencies you know to call 911 or go to the nearest emergency room

## 2013-06-22 ENCOUNTER — Encounter: Payer: Self-pay | Admitting: Neurology

## 2013-06-22 ENCOUNTER — Telehealth: Payer: Self-pay | Admitting: *Deleted

## 2013-06-22 ENCOUNTER — Encounter: Payer: Medicaid Other | Admitting: Neurology

## 2013-06-22 NOTE — Telephone Encounter (Signed)
Message copied by Ardeth SportsmanHODES, Wanette Robison L on Thu Jun 22, 2013  4:47 PM ------      Message from: Ramond MarrowSUMNER, PETER J      Created: Tue Jun 20, 2013  8:26 AM       Please let her know her MRI was normal. Thankss ------

## 2013-06-22 NOTE — Telephone Encounter (Signed)
Spoke to patient and she is aware of normal MRI results.

## 2013-06-23 ENCOUNTER — Ambulatory Visit (HOSPITAL_COMMUNITY)
Admission: RE | Admit: 2013-06-23 | Discharge: 2013-06-23 | Disposition: A | Discharge status: Home / Self Care | Payer: Medicaid Other | Source: Ambulatory Visit | Attending: Urology | Admitting: Urology

## 2013-06-23 ENCOUNTER — Encounter: Payer: Self-pay | Admitting: Neurology

## 2013-06-23 DIAGNOSIS — N361 Urethral diverticulum: Secondary | ICD-10-CM

## 2013-06-26 ENCOUNTER — Ambulatory Visit (INDEPENDENT_AMBULATORY_CARE_PROVIDER_SITE_OTHER): Payer: Medicaid Other | Admitting: Diagnostic Neuroimaging

## 2013-06-26 ENCOUNTER — Encounter (INDEPENDENT_AMBULATORY_CARE_PROVIDER_SITE_OTHER): Payer: Self-pay

## 2013-06-26 DIAGNOSIS — Z0289 Encounter for other administrative examinations: Secondary | ICD-10-CM

## 2013-06-26 DIAGNOSIS — M79609 Pain in unspecified limb: Secondary | ICD-10-CM

## 2013-06-26 NOTE — Procedures (Signed)
   [facility]  NCS (NERVE CONDUCTION STUDY) WITH EMG (ELECTROMYOGRAPHY) REPORT   STUDY DATE: 06/26/13 PATIENT NAME: Melinda ClaymanLatanya J Platt DOB: Jan 29, 1981 MRN: [id number]  ORDERING CLINICIAN: Elspeth ChoPeter Sumner, DO   TECHNOLOGIST: Gearldine ShownLorraine Jones ELECTROMYOGRAPHER: Glenford BayleyVikram R. Lucky Alverson, MD  CLINICAL INFORMATION: 33 year old female with pain in left greater than right arm.  FINDINGS: NERVE CONDUCTION STUDY: Bilateral median and bilateral ulnar motor responses have normal distal latencies, amplitudes, conduction velocities and F-wave latencies. Bilateral median and ulnar sensory responses are normal.  NEEDLE ELECTROMYOGRAPHY: Needle examination of selected muscles of the left upper extremity (deltoid, biceps, triceps, flexor carpi radialis, first dorsal interosseous) and left C5-6 and C7-T1 paraspinal muscles are normal. No abnormal spontaneous activity at rest and normal motor unit recruitment on exertion.  IMPRESSION:  Normal study. No electrodiagnostic evidence of large fiber neuropathy or cervical radiculopathy.   INTERPRETING PHYSICIAN:  Suanne MarkerVIKRAM R. Hend Mccarrell, MD Certified in Neurology, Neurophysiology and Neuroimaging  [facility] [address], [address] [place], [place] [address] [phone]

## 2013-06-29 ENCOUNTER — Ambulatory Visit (HOSPITAL_COMMUNITY): Admission: RE | Admit: 2013-06-29 | Payer: Medicaid Other | Source: Ambulatory Visit

## 2013-06-29 ENCOUNTER — Ambulatory Visit: Payer: Medicaid Other | Admitting: Neurology

## 2013-07-05 ENCOUNTER — Encounter (INDEPENDENT_AMBULATORY_CARE_PROVIDER_SITE_OTHER): Payer: Self-pay

## 2013-07-05 ENCOUNTER — Other Ambulatory Visit (INDEPENDENT_AMBULATORY_CARE_PROVIDER_SITE_OTHER): Payer: Self-pay

## 2013-07-05 ENCOUNTER — Encounter: Payer: Self-pay | Admitting: Neurology

## 2013-07-05 ENCOUNTER — Ambulatory Visit (INDEPENDENT_AMBULATORY_CARE_PROVIDER_SITE_OTHER): Payer: Medicaid Other | Admitting: Neurology

## 2013-07-05 VITALS — BP 143/94 | HR 90 | Ht 62.5 in | Wt 264.0 lb

## 2013-07-05 DIAGNOSIS — M791 Myalgia, unspecified site: Secondary | ICD-10-CM

## 2013-07-05 DIAGNOSIS — IMO0001 Reserved for inherently not codable concepts without codable children: Secondary | ICD-10-CM

## 2013-07-05 DIAGNOSIS — Z0289 Encounter for other administrative examinations: Secondary | ICD-10-CM

## 2013-07-05 MED ORDER — DULOXETINE HCL 30 MG PO CPEP: 30.0000 mg | ORAL_CAPSULE | Freq: Every day | ORAL | Status: DC

## 2013-07-05 NOTE — Progress Notes (Signed)
[facility]    Provider:  Dr Hosie Poisson Referring Provider: Oliver Pila, MD Primary Care Physician:  Oliver Pila, MD  CC:  Pain after recent delivery  HPI:  [name] is a 33 y.o. female here as a follow up from Dr. Senaida Ores for pain after recent vaginal delivery. At last visit her Lyrica was increased to 125mg  BID and she had an EMG/NCS which was normal. Continues to note pain/difficulty in her wrists, feels there is some aching in her bones. Notes some weight gain from use of Lyrica. She is not breastfeeding.   No other acute concerns at this time.    Initial visit 05/2012 Recent delivery on December 6, developed sharp pain in lumbar region that radiates up into her head, causes some stiffness, developed aching sensation in her arms, left > right. Can also start in the cervical region and then radiate downwards. Had very similar symptoms after delivering initial child in 2011, lasted for 2 weeks and then resolved. All of this started 3 to 4 days after delivery. Arm pain is constant, the spasm episodes come and go. Headache starts occipital, radiates frontal, described as pulsating pain. Headache can last hours. No N/V. Notes some blurry vision. No LOC.   Has no episodes similar to this not associated with pregnancy.   Review of Systems: Out of a complete 14 system review, the patient complains of only the following symptoms, and all other reviewed systems are negative. Positive for joint pain back pain aching muscles  History   Social History  . Marital Status: Married    Spouse Name: Dallas    Number of Children: 4  . Years of Education: college   Occupational History  . TEACHER    Social History Main Topics  . Smoking status: Never Smoker   . Smokeless tobacco: Not on file  . Alcohol Use: No  . Drug Use: No  . Sexual Activity: Yes    Birth Control/ Protection: None   Other Topics Concern  . Not on file   Social History Narrative    Patient lives at home with her husband Production manager) and 4 children   Patient is left handed   Education some college   Caffeine consumption if any 1 a day    Family History  Problem Relation Age of Onset  . Heart disease Mother   . Asthma Mother   . COPD Mother   . Stroke Father   . Seizures Father   . Heart disease Maternal Grandmother     Past Medical History  Diagnosis Date  . Asthma   . Migraine     Past Surgical History  Procedure Laterality Date  . Tympanostomy tube placement    . Hernia repair    . Tonsillectomy    . Adenoidectomy      Current Outpatient Prescriptions  Medication Sig Dispense Refill  . albuterol (PROVENTIL HFA;VENTOLIN HFA) 108 (90 BASE) MCG/ACT inhaler Inhale 2 puffs into the lungs every 6 (six) hours as needed. For shortness of breath       . ibuprofen (ADVIL,MOTRIN) 600 MG tablet Take 1 tablet (600 mg total) by mouth every 6 (six) hours as needed.  30 tablet  0  . oxyCODONE-acetaminophen (PERCOCET/ROXICET) 5-325 MG per tablet Take 1 tablet by mouth every 6 (six) hours as needed for moderate pain.  30 tablet  0  . pregabalin (LYRICA) 75 MG capsule Take 1 capsule (75 mg total) by mouth 2 (two) times daily.  60 capsule  3  . Prenatal Vit-Fe Fumarate-FA (PRENATAL MULTIVITAMIN) TABS tablet Take 1 tablet by mouth daily at 12 noon.      . DULoxetine (CYMBALTA) 30 MG capsule Take 1 capsule (30 mg total) by mouth daily.  30 capsule  1   No current facility-administered medications for this visit.    Allergies as of 07/05/2013 - Review Complete 07/05/2013  Allergen Reaction Noted  . Amoxicillin Hives   . Latex Other (See Comments)   . Penicillins Hives     Vitals: BP 143/94  Pulse 90  Ht 5' 2.5" (1.588 m)  Wt 264 lb (119.75 kg)  BMI 47.49 kg/m2 Last Weight:  Wt Readings from Last 1 Encounters:  07/05/13 264 lb (119.75 kg)   Last Height:   Ht Readings from Last 1 Encounters:  07/05/13 5' 2.5" (1.588 m)     Physical  exam: Exam: Gen: NAD, conversant Eyes: anicteric sclerae, moist conjunctivae HENT: Atraumatic, oropharynx clear Neck: Trachea midline; supple,  Lungs: CTA, no wheezing, rales, rhonic                          CV: RRR, no MRG Abdomen: Soft, non-tender;  Extremities: No peripheral edema  Skin: Normal temperature, no rash,  Psych: Appropriate affect, pleasant  Neuro: MS: AA&Ox3, appropriately interactive, normal affect   Speech: fluent w/o paraphasic error  Memory: good recent and remote recall  CN: PERRL, EOMI no nystagmus, no ptosis, sensation intact to LT V1-V3 bilat, face symmetric, no weakness, hearing grossly intact, palate elevates symmetrically, shoulder shrug 5/5 bilat,  tongue protrudes midline, no fasiculations noted.  Motor: normal bulk and tone Strength: 5/5  In all extremities with giveaway weakness noted in bilateral UE (patient reports severe pain with muscle activation)  Coord: rapid alternating and point-to-point (FNF, HTS) movements intact.  Reflexes: symmetrical, bilat downgoing toes  Sens: LT intact in all extremities + Phalen's sign, negative Tinel's  Gait: posture, stance, stride and arm-swing normal. Tandem gait intact. Able to walk on heels and toes. Romberg absent.   Assessment:  After physical and neurologic examination, review of laboratory studies, imaging, neurophysiology testing and pre-existing records, assessment will be reviewed on the problem list.  Plan:  Treatment plan and additional workup will be reviewed under Problem List.  1)Upper extremity pain 2)Cervical neck pain: resolved  32y/o woman presenting for follow up  evaluation of episodes of subjective muscle weakness and aching since recent vaginal delivery. She has had a negative MRI/A/V of the brain, negative MRI C spine and normal EMG/NCS. CK and ANA is pending. Unclear etiology of symptoms. Would have concern for autoimmune disorder vs fibromyalgia.  Based on giveaway weakness and  overall history/exam, question if there is some functional overlay to her exam. Will decrease Lyrica to 75mg  BID with goal of tapering off. Will start Cymbalta 30mg  daily, can titrate up as tolerated. Will refer to Rheumatology for further input. Follow up after evaluation.     Elspeth ChoPeter Jillisa Harris, DO  [facility] [address] [place], [place] [address]  Phone [phone] Fax [phone]

## 2013-07-05 NOTE — Patient Instructions (Signed)
Overall you are doing fairly well but I do want to suggest a few things today:   Remember to drink plenty of fluid, eat healthy meals and do not skip any meals. Try to eat protein with a every meal and eat a healthy snack such as fruit or nuts in between meals. Try to keep a regular sleep-wake schedule and try to exercise daily, particularly in the form of walking, 20-30 minutes a day, if you can.   As far as your medications are concerned, I would like to suggest the following: 1)Decrease the Lyrica to 75mg  twice a day 2)Add Cymbalta 30mg  daily  Please follow up with rheumatology for further workup  I would like to see you back after you see rheumatology, sooner if we need to. Please call us with any interim questions, concerns, problems, updates or refill requests.   My clinical assistant and will answer any of your questions and relay your messages to me and also relay most of my messages to you.   Our phone number is [phone]. We also have an after hours call service for urgent matters and there is a physician on-call for urgent questions. For any emergencies you know to call 911 or go to the nearest emergency room

## 2013-07-06 LAB — CK: CK TOTAL: 127 U/L (ref 24–173)

## 2013-07-06 LAB — ANA: ANA: NEGATIVE

## 2013-07-06 NOTE — Progress Notes (Signed)
Quick Note:  Called patient to inform her of recent lab results, and for her to follow up with Rheumatology, patient stated that she does not have a Rheumatologist, and asked where you referring her to one? Please advise ______

## 2013-07-07 NOTE — Progress Notes (Signed)
Quick Note:  Called patient left message to inform her that a referral was placed for a Rheumatologist by Dr. Hosie PoissonSumner, and she will be contacted with that appointment, in questions or concerns to give us a call back ______

## 2013-07-10 ENCOUNTER — Ambulatory Visit (HOSPITAL_COMMUNITY)
Admission: RE | Admit: 2013-07-10 | Discharge: 2013-07-10 | Disposition: A | Discharge status: Home / Self Care | Payer: Medicaid Other | Source: Ambulatory Visit | Attending: Urology | Admitting: Urology

## 2013-07-10 DIAGNOSIS — R188 Other ascites: Secondary | ICD-10-CM | POA: Insufficient documentation

## 2013-07-10 DIAGNOSIS — Z0389 Encounter for observation for other suspected diseases and conditions ruled out: Secondary | ICD-10-CM | POA: Insufficient documentation

## 2013-07-10 MED ORDER — GADOBENATE DIMEGLUMINE 529 MG/ML IV SOLN
20.0000 mL | Freq: Once | INTRAVENOUS | Status: AC | PRN
  Administered 2013-07-10: 20 mL via INTRAVENOUS

## 2013-09-11 ENCOUNTER — Telehealth: Payer: Self-pay | Admitting: *Deleted

## 2013-09-11 ENCOUNTER — Other Ambulatory Visit: Payer: Self-pay

## 2013-09-11 MED ORDER — DULOXETINE HCL 30 MG PO CPEP: 30.0000 mg | ORAL_CAPSULE | Freq: Every day | ORAL | Status: DC

## 2013-09-11 MED ORDER — PREGABALIN 75 MG PO CAPS: 75.0000 mg | ORAL_CAPSULE | Freq: Two times a day (BID) | ORAL | Status: DC

## 2013-09-11 NOTE — Telephone Encounter (Signed)
Patient is requesting refills on both meds.  Says she has not been able to see rheumatologist yet.  Thank you.

## 2013-09-11 NOTE — Telephone Encounter (Signed)
Rx signed and faxed.

## 2013-09-11 NOTE — Telephone Encounter (Signed)
Patient wanting to know if she could pick up samples for pregabalin (LYRICA) 75 MG capsule and DULoxetine (CYMBALTA) 30 MG capsule.  Please call patient and advise.  thanks

## 2013-09-11 NOTE — Telephone Encounter (Signed)
Unfortunately, Cymbalta is no longer sampled, as it went generic in January.  I called the patient back.  She said she would like refills for both meds sent to [facility].  Says she has not been able to see rheumatologist yet.

## 2013-09-26 ENCOUNTER — Encounter (HOSPITAL_COMMUNITY): Payer: Self-pay | Admitting: *Deleted

## 2013-09-26 ENCOUNTER — Inpatient Hospital Stay (HOSPITAL_COMMUNITY)
Admission: AD | Admit: 2013-09-26 | Discharge: 2013-09-26 | Disposition: A | Discharge status: Home / Self Care | Payer: Medicaid Other | Source: Ambulatory Visit | Attending: Obstetrics and Gynecology | Admitting: Obstetrics and Gynecology

## 2013-09-26 DIAGNOSIS — Z3202 Encounter for pregnancy test, result negative: Secondary | ICD-10-CM | POA: Insufficient documentation

## 2013-09-26 DIAGNOSIS — Z880 Allergy status to penicillin: Secondary | ICD-10-CM | POA: Insufficient documentation

## 2013-09-26 DIAGNOSIS — N899 Noninflammatory disorder of vagina, unspecified: Secondary | ICD-10-CM | POA: Insufficient documentation

## 2013-09-26 DIAGNOSIS — R109 Unspecified abdominal pain: Secondary | ICD-10-CM | POA: Insufficient documentation

## 2013-09-26 HISTORY — DX: Fibromyalgia: M79.7

## 2013-09-26 LAB — URINALYSIS, ROUTINE W REFLEX MICROSCOPIC
Bilirubin Urine: NEGATIVE
Glucose, UA: NEGATIVE mg/dL
Ketones, ur: NEGATIVE mg/dL
NITRITE: NEGATIVE
PH: 6 (ref 5.0–8.0)
Protein, ur: NEGATIVE mg/dL
SPECIFIC GRAVITY, URINE: 1.025 (ref 1.005–1.030)
UROBILINOGEN UA: 0.2 mg/dL (ref 0.0–1.0)

## 2013-09-26 LAB — URINE MICROSCOPIC-ADD ON

## 2013-09-26 LAB — WET PREP, GENITAL
Clue Cells Wet Prep HPF POC: NONE SEEN
TRICH WET PREP: NONE SEEN
Yeast Wet Prep HPF POC: NONE SEEN

## 2013-09-26 LAB — POCT PREGNANCY, URINE: PREG TEST UR: NEGATIVE

## 2013-09-26 MED ORDER — FLUCONAZOLE 150 MG PO TABS
150.0000 mg | ORAL_TABLET | Freq: Once | ORAL | Status: AC
  Administered 2013-09-26: 150 mg via ORAL
  Filled 2013-09-26: qty 1

## 2013-09-26 MED ORDER — IBUPROFEN 600 MG PO TABS
600.0000 mg | ORAL_TABLET | Freq: Once | ORAL | Status: AC
  Administered 2013-09-26: 600 mg via ORAL
  Filled 2013-09-26: qty 1

## 2013-09-26 NOTE — Discharge Instructions (Signed)
F/U with primary care provider or OBGYN physician of choice.

## 2013-09-26 NOTE — MAU Provider Note (Signed)
History     CSN: [id number]  Arrival date and time: 09/26/13 1627   First Provider Initiated Contact with Patient 09/26/13 1809      No chief complaint on file.  HPI   Past Medical History  Diagnosis Date  . Asthma   . Migraine   . Fibromyalgia     Past Surgical History  Procedure Laterality Date  . Tympanostomy tube placement    . Hernia repair    . Tonsillectomy    . Adenoidectomy      Family History  Problem Relation Age of Onset  . Heart disease Mother   . Asthma Mother   . COPD Mother   . Stroke Father   . Seizures Father   . Heart disease Maternal Grandmother     History  Substance Use Topics  . Smoking status: Never Smoker   . Smokeless tobacco: Never Used  . Alcohol Use: No    Allergies:  Allergies  Allergen Reactions  . Amoxicillin Hives  . Latex Other (See Comments)    Burns and blisters skin  . Penicillins Hives    Prescriptions prior to admission  Medication Sig Dispense Refill  . acetaminophen (TYLENOL) 325 MG tablet Take 650 mg by mouth every 6 (six) hours as needed for moderate pain.      . DULoxetine (CYMBALTA) 30 MG capsule Take 1 capsule (30 mg total) by mouth daily.  30 capsule  3  . pregabalin (LYRICA) 75 MG capsule Take 1 capsule (75 mg total) by mouth 2 (two) times daily.  60 capsule  3  . albuterol (PROVENTIL HFA;VENTOLIN HFA) 108 (90 BASE) MCG/ACT inhaler Inhale 2 puffs into the lungs every 6 (six) hours as needed. For shortness of breath         ROS Physical Exam   Blood pressure 141/105, pulse 103, temperature 99.3 F (37.4 C), temperature source Oral, resp. rate 20, height 5' 0.5" (1.537 m), weight 122.108 kg (269 lb 3.2 oz), SpO2 99.00%, not currently breastfeeding.  Physical Exam  MAU Course  Procedures Results for orders placed during the hospital encounter of 09/26/13 (from the past 24 hour(s))  URINALYSIS, ROUTINE W REFLEX MICROSCOPIC     Status: Abnormal   Collection Time    09/26/13  4:45 PM      Result  Value Ref Range   Color, Urine YELLOW  YELLOW   APPearance CLEAR  CLEAR   Specific Gravity, Urine 1.025  1.005 - 1.030   pH 6.0  5.0 - 8.0   Glucose, UA NEGATIVE  NEGATIVE mg/dL   Hgb urine dipstick TRACE (*) NEGATIVE   Bilirubin Urine NEGATIVE  NEGATIVE   Ketones, ur NEGATIVE  NEGATIVE mg/dL   Protein, ur NEGATIVE  NEGATIVE mg/dL   Urobilinogen, UA 0.2  0.0 - 1.0 mg/dL   Nitrite NEGATIVE  NEGATIVE   Leukocytes, UA MODERATE (*) NEGATIVE  URINE MICROSCOPIC-ADD ON     Status: Abnormal   Collection Time    09/26/13  4:45 PM      Result Value Ref Range   Squamous Epithelial / LPF FEW (*) RARE   WBC, UA 3-6  <3 WBC/hpf   RBC / HPF 0-2  <3 RBC/hpf  POCT PREGNANCY, URINE     Status: None   Collection Time    09/26/13  5:20 PM      Result Value Ref Range   Preg Test, Ur NEGATIVE  NEGATIVE      Assessment and Plan    Allayne ButcherSusan P  Chase PicketLineberry 09/26/2013, 6:37 PM

## 2013-09-26 NOTE — MAU Note (Signed)
Patient states she had a lump bulging out of the vagina that she noticed yesterday but today is large and firm. States she is having abdominal pain now.

## 2013-09-26 NOTE — MAU Note (Signed)
States she was diagnosed with a cyst on her bladder that will need to be removed. Currently has hives on her body that itch X 3-4 days. Taking benadryl.

## 2013-09-27 LAB — GC/CHLAMYDIA PROBE AMP
CT Probe RNA: NEGATIVE
GC PROBE AMP APTIMA: NEGATIVE

## 2013-09-30 ENCOUNTER — Emergency Department (HOSPITAL_COMMUNITY)
Admission: EM | Admit: 2013-09-30 | Discharge: 2013-09-30 | Disposition: A | Discharge status: Home / Self Care | Payer: Medicaid Other | Attending: Emergency Medicine | Admitting: Emergency Medicine

## 2013-09-30 ENCOUNTER — Encounter (HOSPITAL_COMMUNITY): Payer: Self-pay | Admitting: Emergency Medicine

## 2013-09-30 DIAGNOSIS — J45909 Unspecified asthma, uncomplicated: Secondary | ICD-10-CM | POA: Insufficient documentation

## 2013-09-30 DIAGNOSIS — Z880 Allergy status to penicillin: Secondary | ICD-10-CM | POA: Insufficient documentation

## 2013-09-30 DIAGNOSIS — G43909 Migraine, unspecified, not intractable, without status migrainosus: Secondary | ICD-10-CM | POA: Insufficient documentation

## 2013-09-30 DIAGNOSIS — N361 Urethral diverticulum: Secondary | ICD-10-CM | POA: Insufficient documentation

## 2013-09-30 DIAGNOSIS — Z79899 Other long term (current) drug therapy: Secondary | ICD-10-CM | POA: Insufficient documentation

## 2013-09-30 DIAGNOSIS — Z792 Long term (current) use of antibiotics: Secondary | ICD-10-CM | POA: Insufficient documentation

## 2013-09-30 DIAGNOSIS — E663 Overweight: Secondary | ICD-10-CM | POA: Insufficient documentation

## 2013-09-30 DIAGNOSIS — Z91040 Latex allergy status: Secondary | ICD-10-CM | POA: Insufficient documentation

## 2013-09-30 MED ORDER — OXYCODONE-ACETAMINOPHEN 5-325 MG PO TABS
1.0000 | ORAL_TABLET | Freq: Once | ORAL | Status: AC
  Administered 2013-09-30: 1 via ORAL
  Filled 2013-09-30: qty 1

## 2013-09-30 MED ORDER — OXYCODONE-ACETAMINOPHEN 5-325 MG PO TABS: 1.0000 | ORAL_TABLET | ORAL | Status: DC | PRN

## 2013-09-30 NOTE — ED Notes (Signed)
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 Patient states the porlaps bladder is worst she is in pain and unable to have bowel movement. Patient states she has been having chills with periods of begin hot.  Patient reports taking tylenol for fever a couple of hours ago along [facility]

## 2013-09-30 NOTE — Discharge Instructions (Signed)
Avoid lifting, and squatting.  If you feel the bladder prolapse, lie down.  See your urologist, as soon as possible for further evaluation.

## 2013-09-30 NOTE — ED Provider Notes (Signed)
CSN: [id number]     Arrival date & time 09/30/13  1845 History   First MD Initiated Contact with Patient 09/30/13 1904     Chief Complaint  Patient presents with  . Pelvic Pain     (Consider location/radiation/quality/duration/timing/severity/associated sxs/prior Treatment) Patient is a 33 y.o. female presenting with pelvic pain. The history is provided by the patient.  Pelvic Pain   She complains of pain in the vagina from a bulge that occurs when she stands up. It has been present for 2 months, but is getting bigger. She was told that she had a "bladder prolapse." She denies fever, chills, nausea, vomiting, weakness, or dizziness. She has been followed by urology, but they have not yet scheduled a surgical repair because of recent pregnancy, and an insurance issue. She denies dysuria, urinary frequency, cough, shortness of breath, fever, or chills. She has mild, low back pain. She occasionally notices blood on toilet tissue, after wiping. She's not had a period, since her child was born 5 months ago. She has had an Essure procedure. There are no other known modifying factors.  Past Medical History  Diagnosis Date  . Asthma   . Migraine   . Fibromyalgia    Past Surgical History  Procedure Laterality Date  . Tympanostomy tube placement    . Hernia repair    . Tonsillectomy    . Adenoidectomy     Family History  Problem Relation Age of Onset  . Heart disease Mother   . Asthma Mother   . COPD Mother   . Stroke Father   . Seizures Father   . Heart disease Maternal Grandmother    History  Substance Use Topics  . Smoking status: Never Smoker   . Smokeless tobacco: Never Used  . Alcohol Use: No   OB History   Grav Para Term Preterm Abortions TAB SAB Ect Mult Living   5 4 4  1   1  4      Review of Systems  Genitourinary: Positive for pelvic pain.  All other systems reviewed and are negative.     Allergies  Amoxicillin; Latex; and Penicillins  Home Medications    Prior to Admission medications   Medication Sig Start Date End Date Taking? Authorizing Provider  acetaminophen (TYLENOL) 325 MG tablet Take 650 mg by mouth every 6 (six) hours as needed for moderate pain.   Yes Historical Provider, MD  albuterol (PROVENTIL HFA;VENTOLIN HFA) 108 (90 BASE) MCG/ACT inhaler Inhale 2 puffs into the lungs every 6 (six) hours as needed. For shortness of breath  04/24/11  Yes Adlih Moreno-Coll, MD  ciprofloxacin (CIPRO) 250 MG tablet Take 250 mg by mouth 2 (two) times daily. For 7 days. 09/30/13  Yes Historical Provider, MD  DULoxetine (CYMBALTA) 30 MG capsule Take 1 capsule (30 mg total) by mouth daily. 09/11/13  Yes Omelia BlackwaterPeter Justin Sumner, DO  pregabalin (LYRICA) 75 MG capsule Take 1 capsule (75 mg total) by mouth 2 (two) times daily. 09/11/13  Yes Omelia BlackwaterPeter Justin Sumner, DO  oxyCODONE-acetaminophen (PERCOCET) 5-325 MG per tablet Take 1 tablet by mouth every 4 (four) hours as needed for severe pain. 09/30/13   Flint MelterElliott L Delsy Etzkorn, MD   BP 145/96  Pulse 119  Temp(Src) 98.8 F (37.1 C) (Oral)  Resp 22  SpO2 99% Physical Exam  Nursing note and vitals reviewed. Constitutional: She is oriented to person, place, and time. She appears well-developed and well-nourished.  Overweight  HENT:  Head: Normocephalic and atraumatic.  Eyes: Conjunctivae  and EOM are normal. Pupils are equal, round, and reactive to light.  Neck: Normal range of motion and phonation normal. Neck supple.  Cardiovascular: Normal rate, regular rhythm and intact distal pulses.   Pulmonary/Chest: Effort normal and breath sounds normal. She exhibits no tenderness.  Abdominal: Soft. She exhibits no distension. There is no tenderness. There is no guarding.  Genitourinary:  Perineal exam- small amount of prolapse of tissue from the superior vaginal wall, near the urethral meatus. The mucosal tissue appears normal and is without bleeding. There is no vaginal discharge, or apparent vaginal bleeding. Bimanual  examination was deferred. There is no apparent bladder prolapse, when examined while in the lithotomy position.  Musculoskeletal: Normal range of motion.  Normal range of motion, back, arms, and legs  Neurological: She is alert and oriented to person, place, and time. She exhibits normal muscle tone.  Skin: Skin is warm and dry.  Psychiatric: She has a normal mood and affect. Her behavior is normal. Judgment and thought content normal.    ED Course  Procedures (including critical care time)  Treated with Percocet for pain in the ED.   Findings assessment patient, and husband-she recently had comprehensive pelvic examination at the  [facility]. GC and Chlamydia testing was negative, and wet prep did not show yeast or clue cells, on 09/26/2013 . 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 There was no large, and/or persistent, bladder prolapse found on examination today.    Nursing Notes Reviewed/ Care Coordinated Applicable Imaging Reviewed Interpretation of Laboratory Data incorporated into ED treatment  The patient appears reasonably screened and/or stabilized for discharge and I doubt any other medical condition or other [facility] requiring further screening, evaluation, or treatment in the ED at this time prior to discharge.  Plan: Home Medications- Percocet; Home Treatments- rest, no lifting; return here if the recommended treatment, does not improve the symptoms; Recommended follow up- Urology f/u asap   Flint MelterElliott L Julion Gatt, MD 09/30/13 2032

## 2013-10-09 ENCOUNTER — Emergency Department (HOSPITAL_COMMUNITY)
Admission: EM | Admit: 2013-10-09 | Discharge: 2013-10-10 | Disposition: A | Discharge status: Home / Self Care | Payer: Medicaid Other | Attending: Emergency Medicine | Admitting: Emergency Medicine

## 2013-10-09 ENCOUNTER — Encounter (HOSPITAL_COMMUNITY): Payer: Self-pay | Admitting: Emergency Medicine

## 2013-10-09 DIAGNOSIS — N760 Acute vaginitis: Secondary | ICD-10-CM | POA: Insufficient documentation

## 2013-10-09 DIAGNOSIS — N361 Urethral diverticulum: Secondary | ICD-10-CM | POA: Insufficient documentation

## 2013-10-09 DIAGNOSIS — R102 Pelvic and perineal pain: Secondary | ICD-10-CM

## 2013-10-09 DIAGNOSIS — L0291 Cutaneous abscess, unspecified: Secondary | ICD-10-CM

## 2013-10-09 DIAGNOSIS — Z792 Long term (current) use of antibiotics: Secondary | ICD-10-CM | POA: Insufficient documentation

## 2013-10-09 DIAGNOSIS — J45909 Unspecified asthma, uncomplicated: Secondary | ICD-10-CM | POA: Insufficient documentation

## 2013-10-09 DIAGNOSIS — Z91040 Latex allergy status: Secondary | ICD-10-CM | POA: Insufficient documentation

## 2013-10-09 DIAGNOSIS — Z3202 Encounter for pregnancy test, result negative: Secondary | ICD-10-CM | POA: Insufficient documentation

## 2013-10-09 DIAGNOSIS — Z79899 Other long term (current) drug therapy: Secondary | ICD-10-CM | POA: Insufficient documentation

## 2013-10-09 DIAGNOSIS — Z880 Allergy status to penicillin: Secondary | ICD-10-CM | POA: Insufficient documentation

## 2013-10-09 DIAGNOSIS — G43909 Migraine, unspecified, not intractable, without status migrainosus: Secondary | ICD-10-CM | POA: Insufficient documentation

## 2013-10-09 NOTE — ED Notes (Signed)
Patient sees urology and was told and treated for a prolapsed bladder and cyst. Patient has some purulent drainage come from "between her legs". She states that it was foul smelling but she feels better.

## 2013-10-09 NOTE — ED Notes (Signed)
Pelvic set up

## 2013-10-09 NOTE — ED Notes (Signed)
Pt states that she has a prolapsed bladder and a urethral cyst, pt states that he pain has increased, she has taken her home Percocet and Ibuprofen without relief, attempted to call her triage nurse with no response. Pt states that she is unable to take the pain. Pt a&o x4, ambulatory to triage, skin warm and dry.

## 2013-10-10 LAB — BASIC METABOLIC PANEL
BUN: 13 mg/dL (ref 6–23)
CALCIUM: 9.4 mg/dL (ref 8.4–10.5)
CO2: 25 mEq/L (ref 19–32)
Chloride: 103 mEq/L (ref 96–112)
Creatinine, Ser: 0.92 mg/dL (ref 0.50–1.10)
GFR, EST NON AFRICAN AMERICAN: 82 mL/min — AB (ref >90)
Glucose, Bld: 111 mg/dL — ABNORMAL HIGH (ref 70–99)
Potassium: 4.1 mEq/L (ref 3.7–5.3)
SODIUM: 140 meq/L (ref 137–147)

## 2013-10-10 LAB — URINALYSIS, ROUTINE W REFLEX MICROSCOPIC
Bilirubin Urine: NEGATIVE
Glucose, UA: NEGATIVE mg/dL
Ketones, ur: NEGATIVE mg/dL
NITRITE: NEGATIVE
PH: 6 (ref 5.0–8.0)
Protein, ur: 30 mg/dL — AB
SPECIFIC GRAVITY, URINE: 1.026 (ref 1.005–1.030)
Urobilinogen, UA: 1 mg/dL (ref 0.0–1.0)

## 2013-10-10 LAB — CBC WITH DIFFERENTIAL/PLATELET
BASOS ABS: 0 10*3/uL (ref 0.0–0.1)
Basophils Relative: 0 % (ref 0–1)
EOS ABS: 0.2 10*3/uL (ref 0.0–0.7)
EOS PCT: 1 % (ref 0–5)
HCT: 38 % (ref 36.0–46.0)
Hemoglobin: 12.6 g/dL (ref 12.0–15.0)
LYMPHS PCT: 15 % (ref 12–46)
Lymphs Abs: 1.9 10*3/uL (ref 0.7–4.0)
MCH: 26.4 pg (ref 26.0–34.0)
MCHC: 33.2 g/dL (ref 30.0–36.0)
MCV: 79.7 fL (ref 78.0–100.0)
Monocytes Absolute: 0.4 10*3/uL (ref 0.1–1.0)
Monocytes Relative: 3 % (ref 3–12)
NEUTROS PCT: 81 % — AB (ref 43–77)
Neutro Abs: 10.4 10*3/uL — ABNORMAL HIGH (ref 1.7–7.7)
PLATELETS: 337 10*3/uL (ref 150–400)
RBC: 4.77 MIL/uL (ref 3.87–5.11)
RDW: 12.8 % (ref 11.5–15.5)
WBC: 12.9 10*3/uL — AB (ref 4.0–10.5)

## 2013-10-10 LAB — URINE MICROSCOPIC-ADD ON

## 2013-10-10 LAB — PREGNANCY, URINE: Preg Test, Ur: NEGATIVE

## 2013-10-10 MED ORDER — OXYCODONE-ACETAMINOPHEN 5-325 MG PO TABS: 1.0000 | ORAL_TABLET | Freq: Four times a day (QID) | ORAL | Status: DC | PRN

## 2013-10-10 MED ORDER — CIPROFLOXACIN HCL 500 MG PO TABS: 500.0000 mg | ORAL_TABLET | Freq: Two times a day (BID) | ORAL | Status: DC

## 2013-10-10 MED ORDER — OXYCODONE-ACETAMINOPHEN 5-325 MG PO TABS
2.0000 | ORAL_TABLET | Freq: Once | ORAL | Status: AC
  Administered 2013-10-10: 2 via ORAL
  Filled 2013-10-10: qty 2

## 2013-10-10 NOTE — Discharge Instructions (Signed)
Discontinue Keflex and start taking ciprofloxacin as prescribed. Take Percocet as needed for pain control. Call your urologist today after 8 AM to schedule followup appointment this week. Return if symptoms worsen such as if you develop fever, chills, abdominal pain, vomiting, or any of the symptoms listed below.  Abscess An abscess (boil or furuncle) is an infected area on or under the skin. This area is filled with yellowish-white fluid (pus) and other material (debris). HOME CARE   Only take medicines as told by your doctor.  If you were given antibiotic medicine, take it as directed. Finish the medicine even if you start to feel better.  If gauze is used, follow your doctor's directions for changing the gauze.  To avoid spreading the infection:  Keep your abscess covered with a bandage.  Wash your hands well.  Do not share personal care items, towels, or whirlpools with others.  Avoid skin contact with others.  Keep your skin and clothes clean around the abscess.  Keep all doctor visits as told. GET HELP RIGHT AWAY IF:   You have more pain, puffiness (swelling), or redness in the wound site.  You have more fluid or blood coming from the wound site.  You have muscle aches, chills, or you feel sick.  You have a fever. MAKE SURE YOU:   Understand these instructions.  Will watch your condition.  Will get help right away if you are not doing well or get worse. Document Released: 10/21/2007 Document Revised: 11/03/2011 Document Reviewed: 07/17/2011 [facility] Patient Information 2014 [place], [place].

## 2013-10-11 LAB — URINE CULTURE: Colony Count: 75000

## 2013-10-12 NOTE — ED Provider Notes (Signed)
CSN: [id number]     Arrival date & time 10/09/13  2158 History   First MD Initiated Contact with Patient 10/10/13 0100     Chief Complaint  Patient presents with  . Prolapse Bladder     (Consider location/radiation/quality/duration/timing/severity/associated sxs/prior Treatment) HPI Comments: Patient is a 33 year old female with a history of urethral diverticulum, asthma, and fibromyalgia who presents to the emergency department for vaginal pain. Patient states the pain has increased over the past week. She states she has taken Percocet at home as well as ibuprofen with little effect. Patient states the pain is worse with walking and when seated as well as with palpation to her vaginal area. She was seen for similar symptoms on 09/30/2013 after which time she followed up, per telephone, with Dr. Sherron Monday of urology who put the patient on prophylactic Keflex. Patient endorses full compliance with this regimen.  Patient states that upon arrival to the ED she felt as though she were urinating on herself. She states she went to the bathroom and noticed that she had pus and blood draining from her vaginal canal. Patient states that since this drainage she has noticed significant improvement in her pain. She denies associated fever, abdominal pain, dysuria, hematuria, nausea or vomiting, diarrhea, pus or drainage from her rectum, numbness/tingling, and weakness.  The history is provided by the patient. No language interpreter was used.    Past Medical History  Diagnosis Date  . Asthma   . Migraine   . Fibromyalgia    Past Surgical History  Procedure Laterality Date  . Tympanostomy tube placement    . Hernia repair    . Tonsillectomy    . Adenoidectomy     Family History  Problem Relation Age of Onset  . Heart disease Mother   . Asthma Mother   . COPD Mother   . Stroke Father   . Seizures Father   . Heart disease Maternal Grandmother    History  Substance Use Topics  . Smoking  status: Never Smoker   . Smokeless tobacco: Never Used  . Alcohol Use: No   OB History   Grav Para Term Preterm Abortions TAB SAB Ect Mult Living   5 4 4  1   1  4      Review of Systems  Constitutional: Negative for fever.  Respiratory: Negative for shortness of breath.   Cardiovascular: Negative for chest pain.  Gastrointestinal: Negative for nausea and vomiting.  Genitourinary: Positive for vaginal discharge (Purulent), vaginal pain and pelvic pain. Negative for dysuria and hematuria.  Neurological: Negative for weakness and numbness.  All other systems reviewed and are negative.     Allergies  Amoxicillin; Latex; and Penicillins  Home Medications   Prior to Admission medications   Medication Sig Start Date End Date Taking? Authorizing Provider  acetaminophen (TYLENOL) 325 MG tablet Take 650 mg by mouth every 6 (six) hours as needed for moderate pain.   Yes Historical Provider, MD  albuterol (PROVENTIL HFA;VENTOLIN HFA) 108 (90 BASE) MCG/ACT inhaler Inhale 2 puffs into the lungs every 6 (six) hours as needed. For shortness of breath  04/24/11  Yes Adlih Moreno-Coll, MD  DULoxetine (CYMBALTA) 30 MG capsule Take 1 capsule (30 mg total) by mouth daily. 09/11/13  Yes Omelia Blackwater, DO  pregabalin (LYRICA) 75 MG capsule Take 1 capsule (75 mg total) by mouth 2 (two) times daily. 09/11/13  Yes Omelia Blackwater, DO  ciprofloxacin (CIPRO) 500 MG tablet Take 1 tablet (500 mg total)  by mouth 2 (two) times daily. 10/10/13   Antony MaduraKelly Vienna Folden, PA-C  oxyCODONE-acetaminophen (PERCOCET) 5-325 MG per tablet Take 1-2 tablets by mouth every 6 (six) hours as needed for severe pain. 10/10/13   Antony MaduraKelly Alvia Jablonski, PA-C   BP 144/82  Pulse 94  Temp(Src) 98.5 F (36.9 C) (Oral)  Resp 18  SpO2 99%  Physical Exam  Nursing note and vitals reviewed. Constitutional: She is oriented to person, place, and time. She appears well-developed and well-nourished. No distress.  HENT:  Head: Normocephalic and  atraumatic.  Eyes: Conjunctivae and EOM are normal. No scleral icterus.  Neck: Normal range of motion.  Cardiovascular: Normal rate, regular rhythm and normal heart sounds.   Pulmonary/Chest: Effort normal and breath sounds normal. No respiratory distress. She has no wheezes. She has no rales.  Abdominal: Soft. She exhibits no mass. There is tenderness (mild suprapubic). There is no rebound and no guarding.  Soft obese abdomen with mild tenderness in the suprapubic region. No peritoneal signs. No masses. Exam limited to body habitus.  Genitourinary: Rectum normal. Rectal exam shows no external hemorrhoid, no internal hemorrhoid, no mass and no tenderness. There is no rash, tenderness, lesion or injury on the right labia. There is no rash, tenderness, lesion or injury on the left labia. There is tenderness around the vagina. No signs of injury around the vagina. Vaginal discharge (Copious purulent bloody discharge appreciated from vaginal canal. No appreciated source of drainage.) found.  Patient with large cystic appearing mass visualized in vaginal vault upon spreading of labia manora and majora. Mass able to be visualized at least 3cm x 4cm. Copious amount of foul smelling, purulent/bloody discharge also appreciated from vault with no appreciated source of drainage. Rectal exam without TTP. No purulent drainage from rectum or evidence of tracking to rectum. No induration to rectal vault or TTP out of proportion to exam. Bimanual exam deferred secondary to discomfort.  Musculoskeletal: Normal range of motion.  Neurological: She is alert and oriented to person, place, and time.  GCS 15. Patient moves extremities without ataxia.  Skin: Skin is warm and dry. No rash noted. She is not diaphoretic. No erythema. No pallor.  Psychiatric: She has a normal mood and affect. Her behavior is normal.    ED Course  Procedures (including critical care time) Labs Review Labs Reviewed  URINALYSIS, ROUTINE W  REFLEX MICROSCOPIC - Abnormal; Notable for the following:    APPearance CLOUDY (*)    Hgb urine dipstick LARGE (*)    Protein, ur 30 (*)    Leukocytes, UA LARGE (*)    All other components within normal limits  CBC WITH DIFFERENTIAL - Abnormal; Notable for the following:    WBC 12.9 (*)    Neutrophils Relative % 81 (*)    Neutro Abs 10.4 (*)    All other components within normal limits  BASIC METABOLIC PANEL - Abnormal; Notable for the following:    Glucose, Bld 111 (*)    GFR calc non Af Amer 82 (*)    All other components within normal limits  URINE CULTURE  PREGNANCY, URINE  URINE MICROSCOPIC-ADD ON    Imaging Review No results found.   EKG Interpretation None      MDM   Final diagnoses:  Abscess  Urethral diverticulum  Pelvic pain    33 year old female presents for worsening pelvic pain and pain in her vaginal canal. Symptoms worsening since last seen on 09/30/2013. Patient has been taking prophylactic Keflex since this time. Pain not  improved with Percocet or ibuprofen prior to arrival. Since arrival in the ED, patient has been draining pus and blood from her vaginal canal. Bimanual exam deferred secondary to discomfort from large mass in vaginal canal. On exam, it appears as though patient's urethral diverticulum has increased in size. MR imaging reviewed from 06/2013 which showed, at that time, only a diverticulum to the size of 10 x 7 x 8 mm. Mass appreciated to be at least 3cm x 4cm today. No appreciated site of origin of purulent drainage. Based on my physical exam, I can only assume that patient has had an abscess formation either associated with her urethral diverticulum or independent to this.  I consulted with Dr. Brunilda Payor of [facility] regarding patient's symptoms. I have discussed pursuit of imaging including U/S, CT, or repeat MR, with Dr. Brunilda Payor for further evaluation of symptoms. Dr. Brunilda Payor does not believe symptoms warrant imaging today. He has advised the  patient be started on Ciprofloxacin and to discontinue Keflex. He has also advised patient contact Dr. Sherron Monday in the AM to schedule follow up. Plan discussed with patient who verbalizes understanding. Percocet prescribed for pain control as needed and return precautions provided. Patient agreeable to plan with no unaddressed concerns.   Filed Vitals:   10/09/13 2204 10/10/13 0155 10/10/13 0255  BP: 126/97 115/72 144/82  Pulse: 113 108 94  Temp: 98.3 F (36.8 C) 98.5 F (36.9 C) 98.5 F (36.9 C)  TempSrc: Oral Oral Oral  Resp: 22 20 18   SpO2: 100% 100% 99%        Antony Madura, PA-C 10/12/13 1442

## 2013-10-13 NOTE — ED Provider Notes (Signed)
Medical screening examination/treatment/procedure(s) were performed by non-physician practitioner and as supervising physician I was immediately available for consultation/collaboration.   EKG Interpretation None       Derwood Kaplan, MD 10/13/13 [phone]

## 2013-10-16 ENCOUNTER — Telehealth: Payer: Self-pay | Admitting: Neurology

## 2013-10-16 NOTE — Telephone Encounter (Signed)
Patient calling to state that Dr. Hosie Poisson said he would refer her to a rheumatologist but she still hasn't heard back yet. Patient states her insurance is now back in effect and would like to get scheduled with the rheumatologist. Please return call and advise.

## 2013-10-16 NOTE — Telephone Encounter (Signed)
Called patient and informed that referral was faxed to Dr Shawnee Knapp office, gave her his office number, she verbalized understanding

## 2013-10-17 ENCOUNTER — Telehealth: Payer: Self-pay | Admitting: Neurology

## 2013-10-17 NOTE — Telephone Encounter (Signed)
  Dr Dierdre Forth does not accept [place] [facility](medicaid), will refax to Dr Zenovia Jordan office [facility])

## 2013-10-17 NOTE — Telephone Encounter (Signed)
Dr. Mills Koller office regarding referral sent on 6/1.  They don't accept [facility].

## 2013-10-18 NOTE — Telephone Encounter (Signed)
Faxed to Dr Marylene Land Hawkes([facility]), received confirmation of fax

## 2013-12-06 ENCOUNTER — Other Ambulatory Visit: Payer: Self-pay | Admitting: Urology

## 2014-01-01 ENCOUNTER — Encounter (HOSPITAL_COMMUNITY): Payer: Self-pay | Admitting: Pharmacy Technician

## 2014-01-02 IMAGING — US US OB COMP LESS 14 WK
1 series · 14 of 28 positions shown · non-contrast
Comparison: Pelvic ultrasound 05/14/2012.

CLINICAL DATA: Crampy pain.  6 weeks 4 days pregnant by last
menstrual period.

OBSTETRIC <14 WK US AND TRANSVAGINAL OB US
TECHNIQUE: Both transabdominal and transvaginal ultrasound
examinations were performed for complete evaluation of the
gestation as well as the maternal uterus, adnexal regions, and
pelvic cul-de-sac.  Transvaginal technique was performed to assess
early pregnancy.

[Series 1: us ob comp less 14 wks · 14 of 40 slices shown]
[im 2/40]
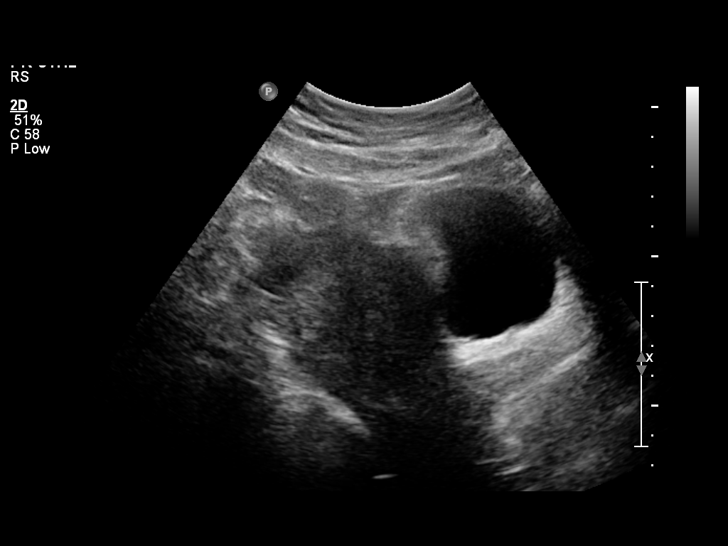
[im 5/40]
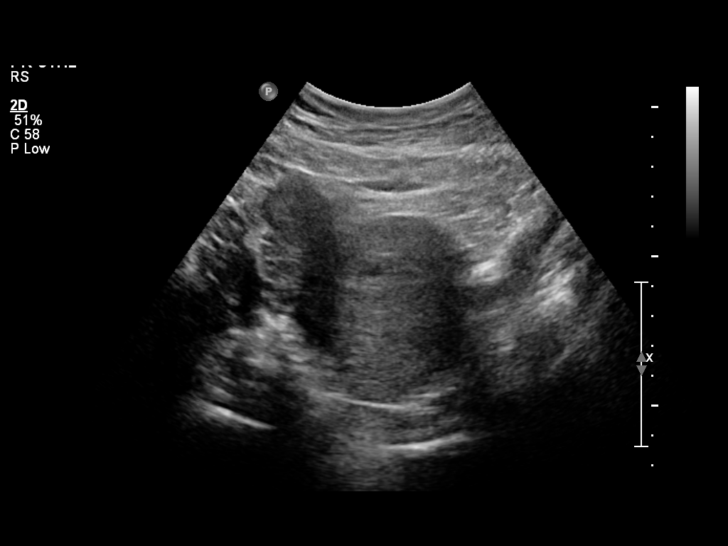
[im 8/40]
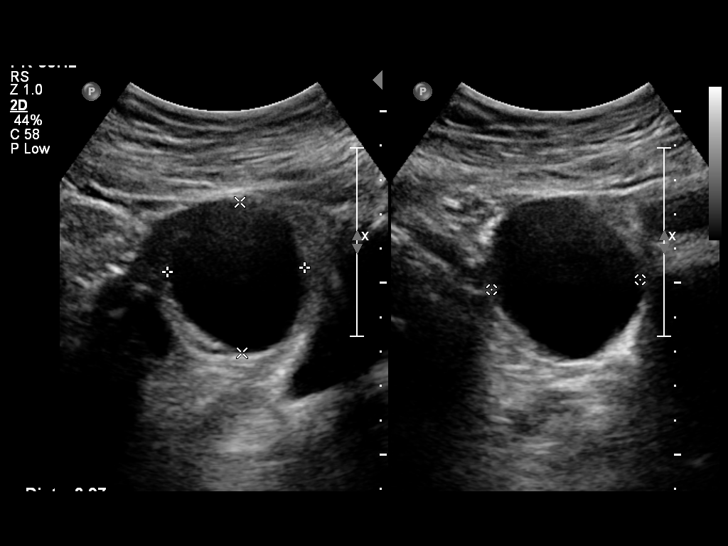
[im 11/40]
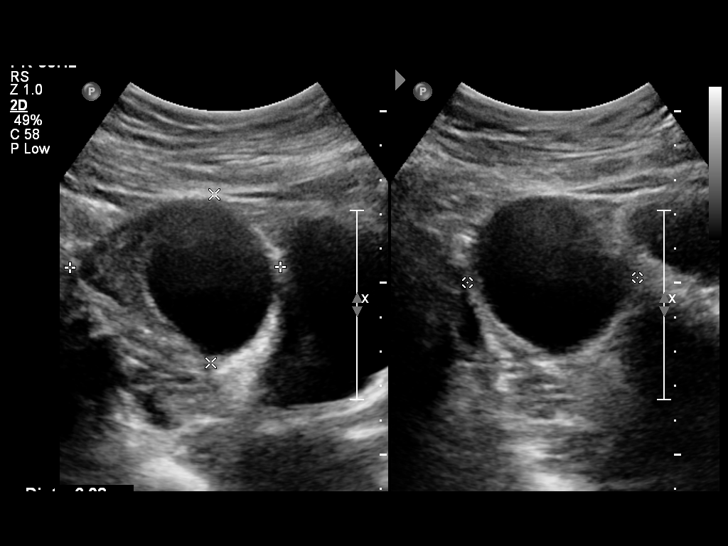
[im 14/40]
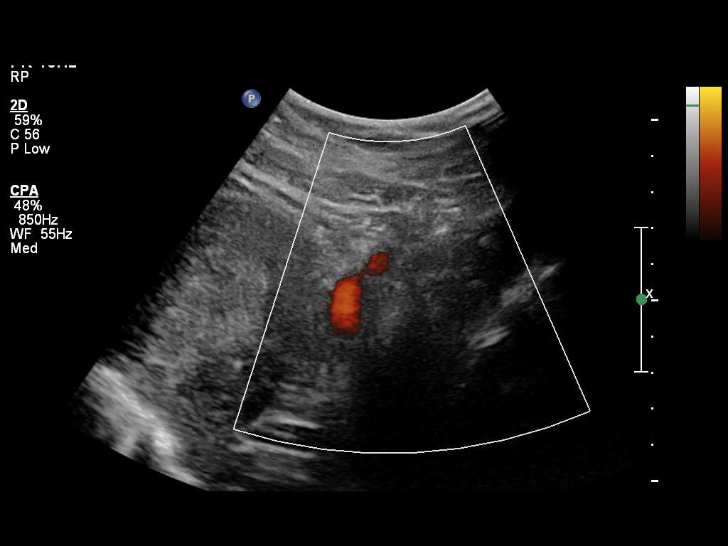
[im 16/40]
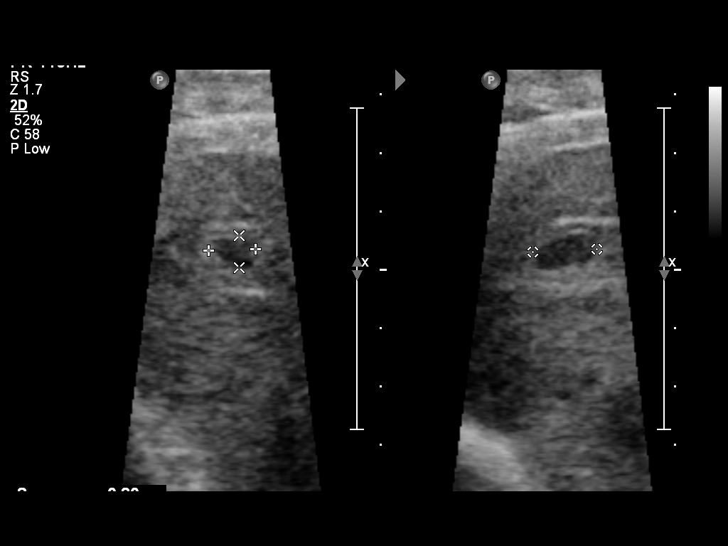
[im 19/40]
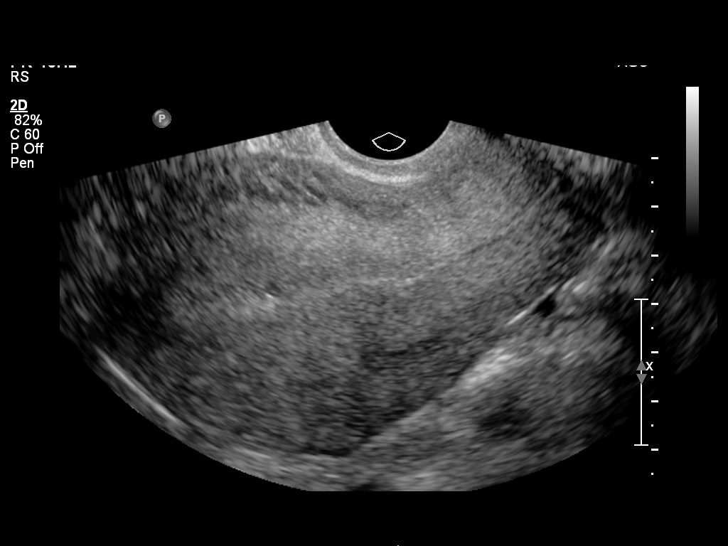
[im 22/40]
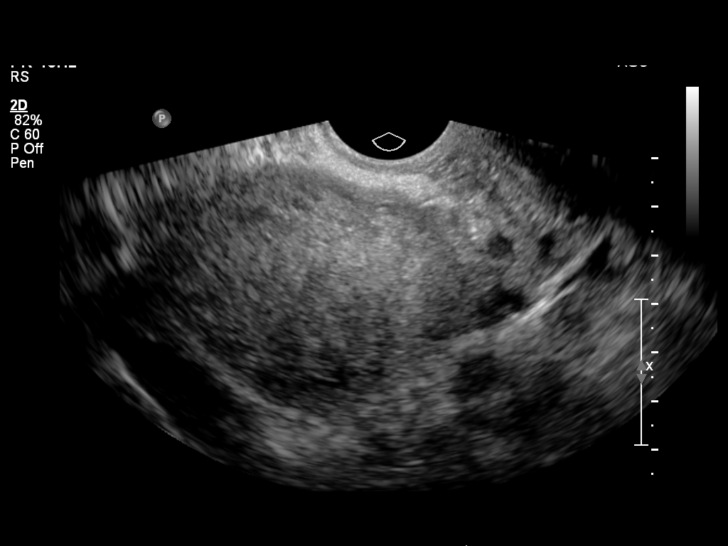
[im 25/40]
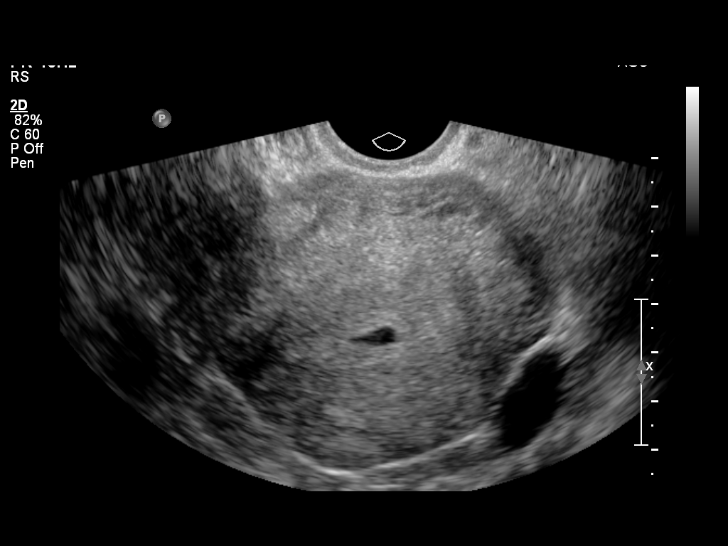
[im 28/40]
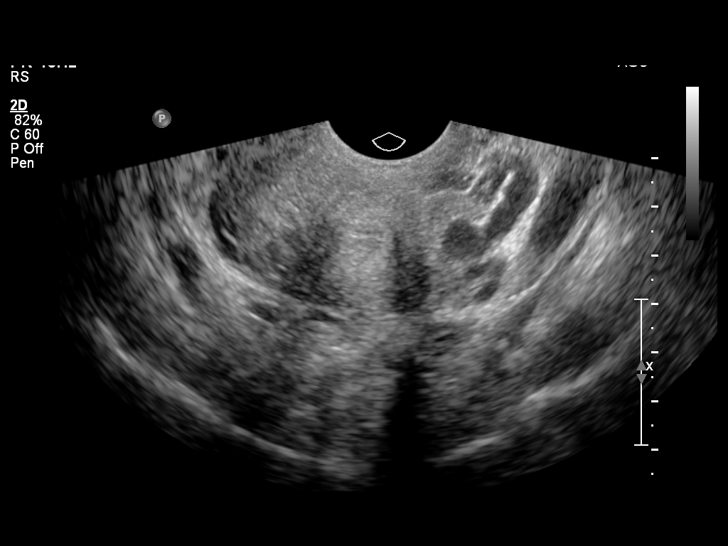
[im 31/40]
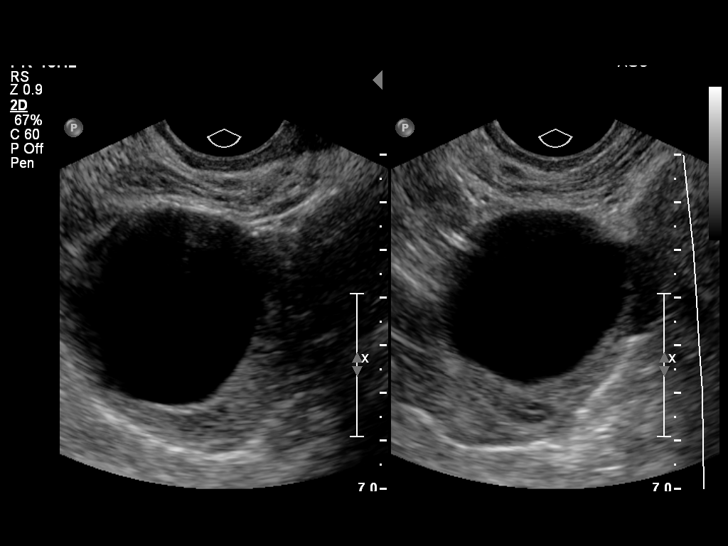
[im 34/40]
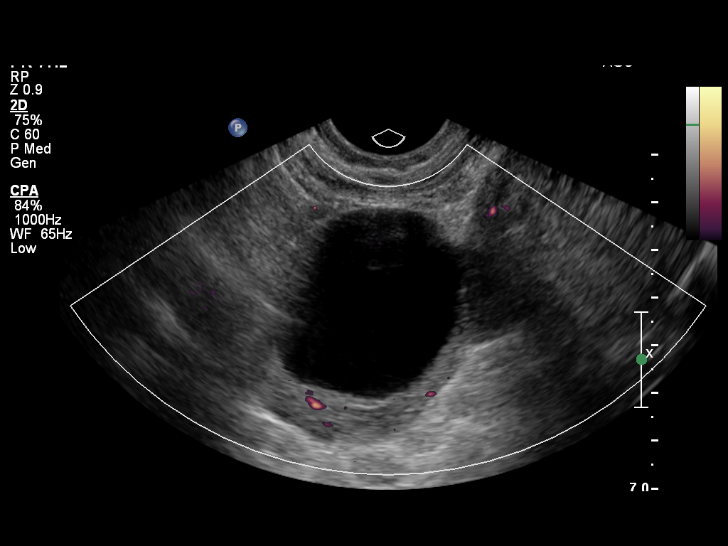
[im 37/40]
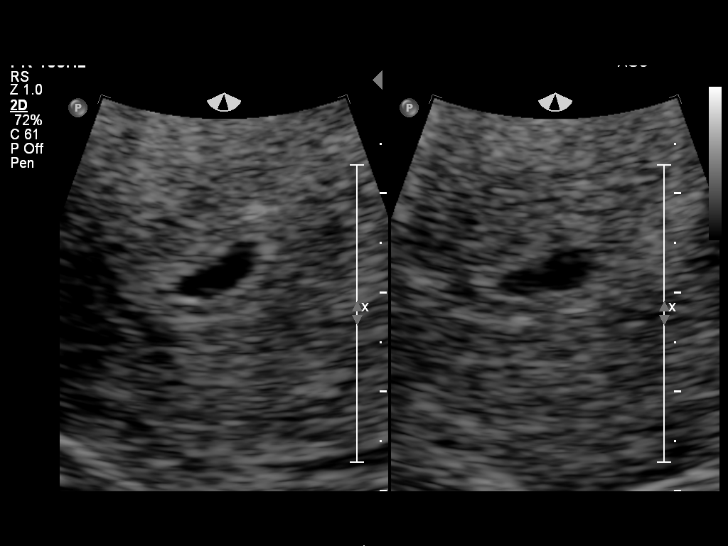
[im 40/40]
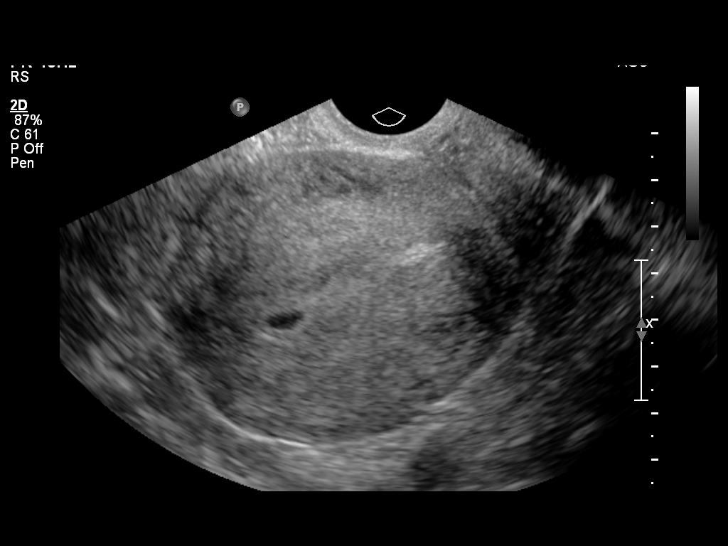

[14 of 28 positions shown; findings below may reference images not displayed]

Intrauterine gestational sac:  Present
Yolk sac: Absent
Embryo: Absent
Cardiac Activity: Not applicable

MSD: 7 mm  5 w 3 d
US EDC: 05/03/2013

Maternal uterus/adnexae:
No evidence of subchorionic hemorrhage.

4.2 cm simple appearing cyst in the right ovary.

Left ovary normal in appearance.

No significant free fluid.
IMPRESSION: 1.  Intrauterine fluid collection which likely represents an early
gestational sac.  This measures 7 mm, corresponding to 5 weeks 3
days.
2.  Right ovarian simple appearing cyst of 4.2 cm.

## 2014-01-03 NOTE — Patient Instructions (Signed)
[name]  01/03/2014   Your procedure is scheduled on:  01/09/14    Report to [facility].  Follow the Signs to Short Stay Center at  0530      am  Call this number if you have problems the morning of surgery: [phone]   Remember:   Do not eat food or drink liquids after midnight.   Take these medicines the morning of surgery with A SIP OF WATER:    Do not wear jewelry, make-up or nail polish.  Do not wear lotions, powders, or perfumes. , deodorant    Do not shave 48 hours prior to surgery.   Do not bring valuables to the hospital.  Contacts, dentures or bridgework may not be worn into surgery.  Leave suitcase in the car. After surgery it may be brought to your room.  For patients admitted to the hospital, checkout time is 11:00 AM the day of  discharge.          Please read over the following fact sheets that you were given:[facility] - Preparing for Surgery Before surgery, you can play an important role.  Because skin is not sterile, your skin needs to be as free of germs as possible.  You can reduce the number of germs on your skin by washing with CHG (chlorahexidine gluconate) soap before surgery.  CHG is an antiseptic cleaner which kills germs and bonds with the skin to continue killing germs even after washing. Please DO NOT use if you have an allergy to CHG or antibacterial soaps.  If your skin becomes reddened/irritated stop using the CHG and inform your nurse when you arrive at Short Stay. Do not shave (including legs and underarms) for at least 48 hours prior to the first CHG shower.  You may shave your face/neck. Please follow these instructions carefully:  1.  Shower with CHG Soap the night before surgery and the  morning of Surgery.  2.  If you choose to wash your hair, wash your hair first as usual with your  normal  shampoo.  3.  After you shampoo, rinse your hair and body thoroughly to remove the  shampoo.                           4.  Use CHG as  you would any other liquid soap.  You can apply chg directly  to the skin and wash                       Gently with a scrungie or clean washcloth.  5.  Apply the CHG Soap to your body ONLY FROM THE NECK DOWN.   Do not use on face/ open                           Wound or open sores. Avoid contact with eyes, ears mouth and genitals (private parts).                       Wash face,  Genitals (private parts) with your normal soap.             6.  Wash thoroughly, paying special attention to the area where your surgery  will be performed.  7.  Thoroughly rinse your body with warm water from the neck down.  8.  DO NOT shower/wash with your  normal soap after using and rinsing off  the CHG Soap.                9.  Pat yourself dry with a clean towel.            10.  Wear clean pajamas.            11.  Place clean sheets on your bed the night of your first shower and do not  sleep with pets. Day of Surgery : Do not apply any lotions/deodorants the morning of surgery.  Please wear clean clothes to the hospital/surgery center.  FAILURE TO FOLLOW THESE INSTRUCTIONS MAY RESULT IN THE CANCELLATION OF YOUR SURGERY PATIENT SIGNATURE_________________________________  NURSE SIGNATURE__________________________________  ________________________________________________________________________  WHAT IS A BLOOD TRANSFUSION? Blood Transfusion Information  A transfusion is the replacement of blood or some of its parts. Blood is made up of multiple cells which provide different functions.  Red blood cells carry oxygen and are used for blood loss replacement.  White blood cells fight against infection.  Platelets control bleeding.  Plasma helps clot blood.  Other blood products are available for specialized needs, such as hemophilia or other clotting disorders. BEFORE THE TRANSFUSION  Who gives blood for transfusions?   Healthy volunteers who are fully evaluated to make sure their blood is safe. This is  blood bank blood. Transfusion therapy is the safest it has ever been in the practice of medicine. Before blood is taken from a donor, a complete history is taken to make sure that person has no history of diseases nor engages in risky social behavior (examples are intravenous drug use or sexual activity with multiple partners). The donor's travel history is screened to minimize risk of transmitting infections, such as malaria. The donated blood is tested for signs of infectious diseases, such as HIV and hepatitis. The blood is then tested to be sure it is compatible with you in order to minimize the chance of a transfusion reaction. If you or a relative donates blood, this is often done in anticipation of surgery and is not appropriate for emergency situations. It takes many days to process the donated blood. RISKS AND COMPLICATIONS Although transfusion therapy is very safe and saves many lives, the main dangers of transfusion include:   Getting an infectious disease.  Developing a transfusion reaction. This is an allergic reaction to something in the blood you were given. Every precaution is taken to prevent this. The decision to have a blood transfusion has been considered carefully by your caregiver before blood is given. Blood is not given unless the benefits outweigh the risks. AFTER THE TRANSFUSION  Right after receiving a blood transfusion, you will usually feel much better and more energetic. This is especially true if your red blood cells have gotten low (anemic). The transfusion raises the level of the red blood cells which carry oxygen, and this usually causes an energy increase.  The nurse administering the transfusion will monitor you carefully for complications. HOME CARE INSTRUCTIONS  No special instructions are needed after a transfusion. You may find your energy is better. Speak with your caregiver about any limitations on activity for underlying diseases you may have. SEEK MEDICAL  CARE IF:   Your condition is not improving after your transfusion.  You develop redness or irritation at the intravenous (IV) site. SEEK IMMEDIATE MEDICAL CARE IF:  Any of the following symptoms occur over the next 12 hours:  Shaking chills.  You have a temperature by mouth  above 102 F (38.9 C), not controlled by medicine.  Chest, back, or muscle pain.  People around you feel you are not acting correctly or are confused.  Shortness of breath or difficulty breathing.  Dizziness and fainting.  You get a rash or develop hives.  You have a decrease in urine output.  Your urine turns a dark color or changes to pink, red, or brown. Any of the following symptoms occur over the next 10 days:  You have a temperature by mouth above 102 F (38.9 C), not controlled by medicine.  Shortness of breath.  Weakness after normal activity.  The white part of the eye turns yellow (jaundice).  You have a decrease in the amount of urine or are urinating less often.  Your urine turns a dark color or changes to pink, red, or brown. Document Released: 05/01/2000 Document Revised: 07/27/2011 Document Reviewed: 12/19/2007 ExitCare Patient Information 2014 [place].  _______________________________________________________________________, coughing and deep breathing exercises, leg exercises

## 2014-01-04 ENCOUNTER — Encounter (HOSPITAL_COMMUNITY)
Admission: RE | Admit: 2014-01-04 | Discharge: 2014-01-04 | Disposition: A | Discharge status: Home / Self Care | Payer: Medicaid Other | Source: Ambulatory Visit | Attending: Urology | Admitting: Urology

## 2014-01-04 ENCOUNTER — Encounter (HOSPITAL_COMMUNITY): Payer: Self-pay

## 2014-01-04 ENCOUNTER — Encounter (INDEPENDENT_AMBULATORY_CARE_PROVIDER_SITE_OTHER): Payer: Self-pay

## 2014-01-04 DIAGNOSIS — N361 Urethral diverticulum: Secondary | ICD-10-CM | POA: Insufficient documentation

## 2014-01-04 DIAGNOSIS — Z01818 Encounter for other preprocedural examination: Secondary | ICD-10-CM | POA: Insufficient documentation

## 2014-01-04 HISTORY — DX: Stress incontinence (female) (male): N39.3

## 2014-01-04 HISTORY — DX: Family history of other specified conditions: Z84.89

## 2014-01-04 HISTORY — DX: Anemia, unspecified: D64.9

## 2014-01-04 LAB — CBC
HCT: 40.6 % (ref 36.0–46.0)
Hemoglobin: 13.1 g/dL (ref 12.0–15.0)
MCH: 25.8 pg — ABNORMAL LOW (ref 26.0–34.0)
MCHC: 32.3 g/dL (ref 30.0–36.0)
MCV: 79.9 fL (ref 78.0–100.0)
Platelets: 249 10*3/uL (ref 150–400)
RBC: 5.08 MIL/uL (ref 3.87–5.11)
RDW: 14.1 % (ref 11.5–15.5)
WBC: 6.3 10*3/uL (ref 4.0–10.5)

## 2014-01-04 LAB — PROTIME-INR
INR: 1.06 (ref 0.00–1.49)
Prothrombin Time: 13.8 seconds (ref 11.6–15.2)

## 2014-01-04 LAB — BASIC METABOLIC PANEL
Anion gap: 10 (ref 5–15)
BUN: 9 mg/dL (ref 6–23)
CO2: 26 mEq/L (ref 19–32)
Calcium: 9.6 mg/dL (ref 8.4–10.5)
Chloride: 102 mEq/L (ref 96–112)
Creatinine, Ser: 0.95 mg/dL (ref 0.50–1.10)
GFR calc Af Amer: >90 mL/min (ref >90)
GFR calc non Af Amer: 78 mL/min — ABNORMAL LOW (ref >90)
Glucose, Bld: 91 mg/dL (ref 70–99)
Potassium: 4.1 mEq/L (ref 3.7–5.3)
Sodium: 138 mEq/L (ref 137–147)

## 2014-01-04 LAB — HCG, SERUM, QUALITATIVE: PREG SERUM: NEGATIVE

## 2014-01-04 LAB — APTT: APTT: 30 s (ref 24–37)

## 2014-01-04 LAB — ABO/RH: ABO/RH(D): AB POS

## 2014-01-08 MED ORDER — GENTAMICIN SULFATE 40 MG/ML IJ SOLN
400.0000 mg | INTRAVENOUS | Status: AC
  Administered 2014-01-09: 400 mg via INTRAVENOUS
  Filled 2014-01-08 (×2): qty 10

## 2014-01-08 NOTE — H&P (Signed)
History of Present Illness   Melinda [name] has a diverticulum initially treated with watchful waiting for personal reasons. In my opinion it likely spontaneously drained when she went to the emergency room and I evaluated her on June 9. She does leak without awareness but denies stress incontinence and wears up to approximately 4 pads a day with urge incontinence. When I examined her, she still had a suburethral swelling. I thought it was a little bit tender. There was no drainage. It appeared to be unchanged in size. I put her on Mobic and Macrodantin and she is here for a 6-week reassessment. Vicodin also given. She understands the discomfort in the right and left suprapubic and her back is likely not related to her diverticulum, though rarely could be referred pain. I have gone over a urethral diverticulectomy and reconstructive surgery in detail with her with sequelae and risk.    [name] had foul odor from the vagina on Saturday and Sunday. She felt singing all over. She said today she feels find with no discharge. She is still on Macrodantin. She is no longer on Mobic.   There are no other modifying factors or associated signs or symptoms. There are no other aggravating or relieving factors. The presentation was moderate in severity and is improved.   Urinalysis: Rare bacteria. Urine sent for culture.   [name] discharge was not as bad as it had been in the past. When I examined her I did not see spontaneous discharge or draining of a diverticulum. I believe the diverticulum was more tender than usual. One could argue it was a little bit inflamed.   She gets hives with penicillin.  All urine cultures have been negative.   I thought it was best to put her on full strength Septra to get tissue levels and potential other organisms. I will have her stop the Macrodantin. She understands that arguably surgery could be more difficult next week, but I think it is time that we proceed with her surgery  and she agreed. Additional potential risks were discussed. The prescription for 40 Vicodin was given.    Past Medical History Problems  1. History of asthma (V12.69) 2. History of gastroesophageal reflux (GERD) (V12.79)  Surgical History Problems  1. History of Hernia Repair 2. History of Tubal Ligation  Current Meds 1. Albuterol Sulfate Powder;  Therapy: (Recorded:30Jan2015) to Recorded 2. Cymbalta CPEP;  Therapy: (Recorded:09Jun2015) to Recorded 3. Hydrocodone-Acetaminophen 10-325 MG Oral Tablet; 1-2 tabs po q 6 hours prn;  Therapy: 09Jun2015 to (Last Rx:16Jul2015) Ordered 4. Lyrica CAPS;  Therapy: (Recorded:30Jan2015) to Recorded 5. Meloxicam 15 MG Oral Tablet; TAKE 1 TABLET DAILY;  Therapy: 09Jun2015 to (Evaluate:07Oct2015); Last Rx:09Jun2015 Ordered 6. Nitrofurantoin Macrocrystal 100 MG Oral Capsule; TAKE 1 CAPSULE Daily;  Therapy: 09Jun2015 to (Evaluate:10Jul2016); Last Rx:16Jul2015 Ordered 7. Percocet TABS;  Therapy: (Recorded:09Jun2015) to Recorded  Allergies Medication  1. Amoxicillin TABS 2. Penicillins Non-Medication  3. Latex  Family History Problems  1. Family history of heart failure (V17.49) : Mother  Social History Problems  1. Denied: History of Alcohol use 2. Caffeine use (V49.89)   one cup 3. Married 4. Non-smoker (V49.89) 5. Number of children   3 sons1 daughter 6. Occupation   Primary school teacher  Vitals Vital Signs [Data Includes: Last 1 Day]  Recorded: 18Aug2015 10:52AM  Blood Pressure: 143 / 87 Temperature: 98.3 F Heart Rate: 71  Results/Data     Assessment Assessed  1. 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 Start: SMZ-TMP DS 800-160 MG Oral Tablet; take 1 tablet bid for 7 days 2. Follow-up Keep Future Appt Office  Follow-up  Status: Complete  Done: 18Aug2015 Urethral diverticulum  3. Renew: Hydrocodone-Acetaminophen  10-325 MG Oral Tablet; 1-2 tabs po q 6 hours prn  Discussion/Summary  Surgery as planned After a thorough review of the management options for the patient's condition the patient  elected to proceed with surgical therapy as noted above. We have discussed the potential benefits and risks of the procedure, side effects of the proposed treatment, the likelihood of the patient achieving the goals of the procedure, and any potential problems that might occur during the procedure or recuperation. Informed consent has been obtained.

## 2014-01-09 ENCOUNTER — Observation Stay (HOSPITAL_COMMUNITY)
Admission: RE | Admit: 2014-01-09 | Discharge: 2014-01-10 | Disposition: A | Discharge status: Home / Self Care | Payer: Medicaid Other | Source: Ambulatory Visit | Attending: Urology | Admitting: Urology

## 2014-01-09 ENCOUNTER — Encounter (HOSPITAL_COMMUNITY)
Admission: RE | Disposition: A | Discharge status: Home / Self Care | Payer: Self-pay | Source: Ambulatory Visit | Attending: Urology

## 2014-01-09 ENCOUNTER — Encounter (HOSPITAL_COMMUNITY): Payer: Medicaid Other | Admitting: Anesthesiology

## 2014-01-09 ENCOUNTER — Encounter (HOSPITAL_COMMUNITY): Payer: Self-pay | Admitting: Anesthesiology

## 2014-01-09 ENCOUNTER — Ambulatory Visit (HOSPITAL_COMMUNITY): Payer: Medicaid Other | Admitting: Anesthesiology

## 2014-01-09 DIAGNOSIS — Z880 Allergy status to penicillin: Secondary | ICD-10-CM | POA: Diagnosis not present

## 2014-01-09 DIAGNOSIS — Z91040 Latex allergy status: Secondary | ICD-10-CM | POA: Diagnosis not present

## 2014-01-09 DIAGNOSIS — Z79899 Other long term (current) drug therapy: Secondary | ICD-10-CM | POA: Diagnosis not present

## 2014-01-09 DIAGNOSIS — N323 Diverticulum of bladder: Secondary | ICD-10-CM

## 2014-01-09 DIAGNOSIS — IMO0001 Reserved for inherently not codable concepts without codable children: Secondary | ICD-10-CM | POA: Insufficient documentation

## 2014-01-09 DIAGNOSIS — J45909 Unspecified asthma, uncomplicated: Secondary | ICD-10-CM | POA: Diagnosis not present

## 2014-01-09 DIAGNOSIS — K219 Gastro-esophageal reflux disease without esophagitis: Secondary | ICD-10-CM | POA: Diagnosis not present

## 2014-01-09 DIAGNOSIS — N361 Urethral diverticulum: Secondary | ICD-10-CM | POA: Diagnosis present

## 2014-01-09 DIAGNOSIS — N3020 Other chronic cystitis without hematuria: Secondary | ICD-10-CM | POA: Insufficient documentation

## 2014-01-09 HISTORY — PX: CYSTOSCOPY: SHX5120

## 2014-01-09 HISTORY — PX: URETHRAL DIVERTICULUM REPAIR: SHX5148

## 2014-01-09 LAB — TYPE AND SCREEN
ABO/RH(D): AB POS
Antibody Screen: NEGATIVE

## 2014-01-09 LAB — HEMOGLOBIN AND HEMATOCRIT, BLOOD
HEMATOCRIT: 35.4 % — AB (ref 36.0–46.0)
HEMOGLOBIN: 11.4 g/dL — AB (ref 12.0–15.0)

## 2014-01-09 SURGERY — CYSTOSCOPY
Anesthesia: General

## 2014-01-09 MED ORDER — ESTRADIOL 0.1 MG/GM VA CREA
TOPICAL_CREAM | VAGINAL | Status: AC
  Filled 2014-01-09: qty 42.5

## 2014-01-09 MED ORDER — MIDAZOLAM HCL 2 MG/2ML IJ SOLN
INTRAMUSCULAR | Status: AC
  Filled 2014-01-09: qty 2

## 2014-01-09 MED ORDER — ONDANSETRON HCL 4 MG/2ML IJ SOLN
INTRAMUSCULAR | Status: AC
  Filled 2014-01-09: qty 4

## 2014-01-09 MED ORDER — LACTATED RINGERS IV SOLN: INTRAVENOUS | Status: DC

## 2014-01-09 MED ORDER — FENTANYL CITRATE 0.05 MG/ML IJ SOLN
25.0000 ug | INTRAMUSCULAR | Status: DC | PRN
  Administered 2014-01-09: 50 ug via INTRAVENOUS

## 2014-01-09 MED ORDER — ACETAMINOPHEN 325 MG PO TABS: 650.0000 mg | ORAL_TABLET | ORAL | Status: DC | PRN

## 2014-01-09 MED ORDER — SODIUM CHLORIDE 0.9 % IJ SOLN
INTRAMUSCULAR | Status: AC
  Filled 2014-01-09: qty 10

## 2014-01-09 MED ORDER — PROMETHAZINE HCL 25 MG/ML IJ SOLN: 6.2500 mg | INTRAMUSCULAR | Status: DC | PRN

## 2014-01-09 MED ORDER — ONDANSETRON HCL 4 MG/2ML IJ SOLN
INTRAMUSCULAR | Status: DC | PRN
  Administered 2014-01-09: 4 mg via INTRAVENOUS

## 2014-01-09 MED ORDER — ONDANSETRON HCL 4 MG/2ML IJ SOLN
4.0000 mg | INTRAMUSCULAR | Status: DC | PRN
  Administered 2014-01-09: 4 mg via INTRAVENOUS
  Filled 2014-01-09: qty 2

## 2014-01-09 MED ORDER — DULOXETINE HCL 30 MG PO CPEP
30.0000 mg | ORAL_CAPSULE | Freq: Every day | ORAL | Status: DC
  Administered 2014-01-09: 30 mg via ORAL
  Filled 2014-01-09 (×3): qty 1

## 2014-01-09 MED ORDER — FENTANYL CITRATE 0.05 MG/ML IJ SOLN
INTRAMUSCULAR | Status: AC
  Filled 2014-01-09: qty 5

## 2014-01-09 MED ORDER — SODIUM CHLORIDE 0.9 % IR SOLN
Status: AC
  Filled 2014-01-09: qty 1

## 2014-01-09 MED ORDER — GLYCOPYRROLATE 0.2 MG/ML IJ SOLN
INTRAMUSCULAR | Status: DC | PRN
  Administered 2014-01-09: .6 mg via INTRAVENOUS

## 2014-01-09 MED ORDER — DEXTROSE-NACL 5-0.45 % IV SOLN
INTRAVENOUS | Status: DC
  Administered 2014-01-09 – 2014-01-10 (×3): via INTRAVENOUS

## 2014-01-09 MED ORDER — LIDOCAINE HCL (CARDIAC) 20 MG/ML IV SOLN
INTRAVENOUS | Status: AC
Start: 2014-01-09 — End: 2014-01-09
  Filled 2014-01-09: qty 5

## 2014-01-09 MED ORDER — LIDOCAINE-EPINEPHRINE (PF) 1 %-1:200000 IJ SOLN
INTRAMUSCULAR | Status: AC
  Filled 2014-01-09: qty 10

## 2014-01-09 MED ORDER — FENTANYL CITRATE 0.05 MG/ML IJ SOLN
INTRAMUSCULAR | Status: AC
  Filled 2014-01-09: qty 2

## 2014-01-09 MED ORDER — CISATRACURIUM BESYLATE (PF) 10 MG/5ML IV SOLN
INTRAVENOUS | Status: DC | PRN
  Administered 2014-01-09: 10 mg via INTRAVENOUS
  Administered 2014-01-09: 2 mg via INTRAVENOUS
  Administered 2014-01-09: 4 mg via INTRAVENOUS

## 2014-01-09 MED ORDER — PREGABALIN 75 MG PO CAPS
75.0000 mg | ORAL_CAPSULE | Freq: Two times a day (BID) | ORAL | Status: DC
  Administered 2014-01-09 – 2014-01-10 (×3): 75 mg via ORAL
  Filled 2014-01-09 (×3): qty 1

## 2014-01-09 MED ORDER — LACTATED RINGERS IV SOLN
INTRAVENOUS | Status: DC | PRN
  Administered 2014-01-09 (×3): via INTRAVENOUS

## 2014-01-09 MED ORDER — ALBUTEROL SULFATE (2.5 MG/3ML) 0.083% IN NEBU: 2.5000 mg | INHALATION_SOLUTION | Freq: Four times a day (QID) | RESPIRATORY_TRACT | Status: DC | PRN

## 2014-01-09 MED ORDER — LABETALOL HCL 5 MG/ML IV SOLN
INTRAVENOUS | Status: DC | PRN
  Administered 2014-01-09: 5 mg via INTRAVENOUS

## 2014-01-09 MED ORDER — MORPHINE SULFATE 2 MG/ML IJ SOLN
2.0000 mg | INTRAMUSCULAR | Status: DC | PRN
  Administered 2014-01-09 – 2014-01-10 (×4): 2 mg via INTRAVENOUS
  Filled 2014-01-09 (×5): qty 1

## 2014-01-09 MED ORDER — PROPOFOL 10 MG/ML IV BOLUS
INTRAVENOUS | Status: DC | PRN
  Administered 2014-01-09: 200 mg via INTRAVENOUS

## 2014-01-09 MED ORDER — ESTRADIOL 0.1 MG/GM VA CREA
TOPICAL_CREAM | VAGINAL | Status: DC | PRN
  Administered 2014-01-09: 2 via VAGINAL

## 2014-01-09 MED ORDER — EPHEDRINE SULFATE 50 MG/ML IJ SOLN
INTRAMUSCULAR | Status: AC
  Filled 2014-01-09: qty 1

## 2014-01-09 MED ORDER — HYDROMORPHONE HCL PF 2 MG/ML IJ SOLN
INTRAMUSCULAR | Status: AC
  Filled 2014-01-09: qty 1

## 2014-01-09 MED ORDER — CIPROFLOXACIN HCL 250 MG PO TABS: 250.0000 mg | ORAL_TABLET | Freq: Two times a day (BID) | ORAL | Status: DC

## 2014-01-09 MED ORDER — METHYLENE BLUE 1 % INJ SOLN
INTRAMUSCULAR | Status: DC | PRN
  Administered 2014-01-09: 10 mL

## 2014-01-09 MED ORDER — MEPERIDINE HCL 50 MG/ML IJ SOLN: 6.2500 mg | INTRAMUSCULAR | Status: DC | PRN

## 2014-01-09 MED ORDER — NEOSTIGMINE METHYLSULFATE 10 MG/10ML IV SOLN
INTRAVENOUS | Status: DC | PRN
  Administered 2014-01-09: 4 mg via INTRAVENOUS

## 2014-01-09 MED ORDER — METHYLENE BLUE 1 % INJ SOLN
INTRAMUSCULAR | Status: AC
  Filled 2014-01-09: qty 10

## 2014-01-09 MED ORDER — HYDROCODONE-ACETAMINOPHEN 10-325 MG PO TABS: 1.0000 | ORAL_TABLET | Freq: Four times a day (QID) | ORAL | Status: DC | PRN

## 2014-01-09 MED ORDER — STERILE WATER FOR IRRIGATION IR SOLN
Status: DC | PRN
  Administered 2014-01-09: 6000 mL

## 2014-01-09 MED ORDER — MENTHOL 3 MG MT LOZG
1.0000 | LOZENGE | OROMUCOSAL | Status: DC | PRN
  Administered 2014-01-09: 3 mg via ORAL
  Filled 2014-01-09: qty 9

## 2014-01-09 MED ORDER — PROPOFOL 10 MG/ML IV BOLUS
INTRAVENOUS | Status: AC
  Filled 2014-01-09: qty 20

## 2014-01-09 MED ORDER — DEXAMETHASONE SODIUM PHOSPHATE 10 MG/ML IJ SOLN
INTRAMUSCULAR | Status: DC | PRN
  Administered 2014-01-09: 10 mg via INTRAVENOUS

## 2014-01-09 MED ORDER — CISATRACURIUM BESYLATE 20 MG/10ML IV SOLN
INTRAVENOUS | Status: AC
  Filled 2014-01-09: qty 10

## 2014-01-09 MED ORDER — HYDROCODONE-ACETAMINOPHEN 10-325 MG PO TABS
1.0000 | ORAL_TABLET | Freq: Four times a day (QID) | ORAL | Status: DC | PRN
  Administered 2014-01-09 – 2014-01-10 (×2): 1 via ORAL
  Filled 2014-01-09 (×2): qty 1

## 2014-01-09 MED ORDER — FENTANYL CITRATE 0.05 MG/ML IJ SOLN
INTRAMUSCULAR | Status: DC | PRN
  Administered 2014-01-09: 50 ug via INTRAVENOUS
  Administered 2014-01-09: 150 ug via INTRAVENOUS
  Administered 2014-01-09: 50 ug via INTRAVENOUS

## 2014-01-09 MED ORDER — SUCCINYLCHOLINE CHLORIDE 20 MG/ML IJ SOLN
INTRAMUSCULAR | Status: DC | PRN
  Administered 2014-01-09: 100 mg via INTRAVENOUS

## 2014-01-09 MED ORDER — LABETALOL HCL 5 MG/ML IV SOLN
INTRAVENOUS | Status: AC
  Filled 2014-01-09: qty 4

## 2014-01-09 MED ORDER — LIDOCAINE HCL (CARDIAC) 20 MG/ML IV SOLN
INTRAVENOUS | Status: DC | PRN
  Administered 2014-01-09: 100 mg via INTRAVENOUS

## 2014-01-09 MED ORDER — HYDROMORPHONE HCL PF 1 MG/ML IJ SOLN
INTRAMUSCULAR | Status: DC | PRN
  Administered 2014-01-09 (×5): .4 mg via INTRAVENOUS

## 2014-01-09 MED ORDER — DEXAMETHASONE SODIUM PHOSPHATE 10 MG/ML IJ SOLN
INTRAMUSCULAR | Status: AC
  Filled 2014-01-09: qty 1

## 2014-01-09 MED ORDER — MIDAZOLAM HCL 5 MG/5ML IJ SOLN
INTRAMUSCULAR | Status: DC | PRN
  Administered 2014-01-09: 2 mg via INTRAVENOUS

## 2014-01-09 MED ORDER — SODIUM CHLORIDE 0.9 % IR SOLN: Freq: Once | Status: DC

## 2014-01-09 SURGICAL SUPPLY — 63 items
BAG URINE DRAINAGE (UROLOGICAL SUPPLIES) ×3 IMPLANT
BANDAGE COBAN STERILE 2 (GAUZE/BANDAGES/DRESSINGS) ×1 IMPLANT
BLADE HEX COATED 2.75 (ELECTRODE) ×3 IMPLANT
BLADE SURG 15 STRL LF DISP TIS (BLADE) ×2 IMPLANT
BLADE SURG 15 STRL SS (BLADE) ×3
BLADE SURG SZ10 CARB STEEL (BLADE) ×1 IMPLANT
BNDG GAUZE ELAST 4 BULKY (GAUZE/BANDAGES/DRESSINGS) ×1 IMPLANT
BRIEF STRETCH FOR OB PAD LRG (UNDERPADS AND DIAPERS) ×3 IMPLANT
CATH BIL BAL PROB 5FR (CATHETERS) IMPLANT
CATH EMB 4FR 40CM (CATHETERS) IMPLANT
CATH EMB 4FR 80CM (CATHETERS) ×2 IMPLANT
CATH EMB 5FR 80CM (CATHETERS) ×2 IMPLANT
CATH FOLEY 2WAY SLVR  5CC 14FR (CATHETERS)
CATH FOLEY 2WAY SLVR  5CC 18FR (CATHETERS)
CATH FOLEY 2WAY SLVR 5CC 14FR (CATHETERS) ×1 IMPLANT
CATH FOLEY 2WAY SLVR 5CC 18FR (CATHETERS) ×1 IMPLANT
CATH SILICONE 14FRX5CC (CATHETERS) ×2 IMPLANT
COVER SURGICAL LIGHT HANDLE (MISCELLANEOUS) ×3 IMPLANT
DRAIN PENROSE 18X1/4 LTX STRL (WOUND CARE) IMPLANT
DRAIN PENROSE LF 8X20.3CM SIL (WOUND CARE) ×2 IMPLANT
ELECT REM PT RETURN 9FT ADLT (ELECTROSURGICAL) ×3
ELECTRODE REM PT RTRN 9FT ADLT (ELECTROSURGICAL) ×1 IMPLANT
GAUZE PACKING 2X5 YD STRL (GAUZE/BANDAGES/DRESSINGS) IMPLANT
GAUZE SPONGE 4X4 16PLY XRAY LF (GAUZE/BANDAGES/DRESSINGS) ×14 IMPLANT
GLOVE BIO SURGEON STRL SZ 6.5 (GLOVE) ×1 IMPLANT
GLOVE BIO SURGEONS STRL SZ 6.5 (GLOVE)
GLOVE BIOGEL M STRL SZ7.5 (GLOVE) ×1 IMPLANT
GLOVE SURG SS PI 6.5 STRL IVOR (GLOVE) ×6 IMPLANT
GLOVE SURG SS PI 7.0 STRL IVOR (GLOVE) ×2 IMPLANT
GLOVE SURG SS PI 7.5 STRL IVOR (GLOVE) ×4 IMPLANT
GOWN STRL REUS W/TWL LRG LVL3 (GOWN DISPOSABLE) ×6 IMPLANT
GOWN STRL REUS W/TWL XL LVL3 (GOWN DISPOSABLE) ×3 IMPLANT
GUIDEWIRE STR DUAL SENSOR (WIRE) ×2 IMPLANT
HOLDER FOLEY CATH W/STRAP (MISCELLANEOUS) ×3 IMPLANT
KIT BASIN OR (CUSTOM PROCEDURE TRAY) ×3 IMPLANT
MANIFOLD NEPTUNE II (INSTRUMENTS) ×3 IMPLANT
NEEDLE HYPO 22GX1.5 SAFETY (NEEDLE) ×4 IMPLANT
PACK CYSTO (CUSTOM PROCEDURE TRAY) ×3 IMPLANT
PAK SCROTO (SET/KITS/TRAYS/PACK) ×1 IMPLANT
PENCIL BUTTON HOLSTER BLD 10FT (ELECTRODE) ×3 IMPLANT
PLUG CATH AND CAP STER (CATHETERS) ×3 IMPLANT
RETRACTOR STAY HOOK 5MM (MISCELLANEOUS) ×3 IMPLANT
SHEET LAVH (DRAPES) ×3 IMPLANT
SUT CHROMIC 3 0 SH 27 (SUTURE) IMPLANT
SUT SILK 2 0 30  PSL (SUTURE) ×2
SUT SILK 2 0 30 PSL (SUTURE) ×1 IMPLANT
SUT SILK 3 0 SH CR/8 (SUTURE) ×3 IMPLANT
SUT SILK 4 0 SH CR/8 (SUTURE) IMPLANT
SUT VIC AB 2-0 CT1 27 (SUTURE) ×6
SUT VIC AB 2-0 CT1 TAPERPNT 27 (SUTURE) ×2 IMPLANT
SUT VIC AB 2-0 SH 27 (SUTURE) ×18
SUT VIC AB 2-0 SH 27X BRD (SUTURE) ×6 IMPLANT
SUT VIC AB 3-0 SH 27 (SUTURE) ×12
SUT VIC AB 3-0 SH 27XBRD (SUTURE) IMPLANT
SUT VIC AB 4-0 RB1 27 (SUTURE) ×3
SUT VIC AB 4-0 RB1 27XBRD (SUTURE) ×6 IMPLANT
SUT VIC AB 4-0 SH 27 (SUTURE)
SUT VIC AB 4-0 SH 27XBRD (SUTURE) IMPLANT
SUT VICRYL 3-0 RB1 18 ABS (SUTURE) ×3 IMPLANT
SYRINGE 12CC LL (MISCELLANEOUS) ×6 IMPLANT
TOWEL OR 17X26 10 PK STRL BLUE (TOWEL DISPOSABLE) ×3 IMPLANT
TOWEL OR NON WOVEN STRL DISP B (DISPOSABLE) ×3 IMPLANT
YANKAUER SUCT BULB TIP 10FT TU (MISCELLANEOUS) ×3 IMPLANT

## 2014-01-09 NOTE — Op Note (Signed)
Preoperative diagnosis: Urethral diverticulum Postoperative diagnosis: Urethral diverticulum Surgery: Excision of urethral diverticulum and cystoscopy Surgeon: Dr. Lorin Picket Lauralie Blacksher Assistant: Pecola Leisure Resident: Harlene Salts  The patient has the above diagnoses and consented to the above procedure. Preoperative laboratory tests were normal. Preoperative antibiotics were given. Concerns preoperatively regarding inflammation and/or infection were noted. Leg position was excellent to minimize the risk of compartment syndrome and neuropathy and deep vein thrombosis. Usual retractors were utilized.  It was actually difficult to see the diverticulum. I thought I could see a faint outline of it. It was approximately at the mid urethra on both sides of the midline.  I cystoscoped the patient and I could see the opening of the diverticulum at 7:00 in the mid urethra. Unfortunately after about 20 minutes I could not cannulate it with a #4 or #3 Fogarty balloon. It would enter the opening but it would not curl in the diverticulum  I marked a widemouth inverted U-shaped vaginal wall incision with the apex just proximal to the urethral meatus approximately 1 cm. I very happy with the wide flap and thickness. I sharply mobilized the flap from the underlying pubocervical fascia. It took many minutes to do so. The tissue was very thickened and inflamed. There was a lot of bleeding almost like a scraped knee on the pavement. This was due to inflammation. There was no nice fibrous planes that one often sees of the pubocervical fascia. I took back the flap to the bladder neck palpably and visibly.  Hemostasis was obtained. Patient was stable throughout the case.  In the area of question I made a linear incision from right to left in the pubocervical fascia and developed the pubocervical fascia flaps cephalad and caudal. It was a very thick flap x2. I used 3-0 silk stay sutures to do this. I was concerned that  I could still not see the diverticulum. I thought I could see one small opening or layer of probable sac epithelium or wall. This turned out to be the sac. It was just to the right of the midline. I used the cystoscopy, sterile water in the bladder, and then indigo Carmine in the bladder and I could definitely see an opening. I still could not cannulate it with a sensor wire a Fogarty balloon. I finally opened it over a small right angle.  The small diverticulum was only about 1 cm in size on MRI and was opened in its entirety. I could trace the diverticulum and could see the Foley catheter in the mid urethra as previously noted. I re\re cystoscoped the patient and for the second time there was no injury or entry into the trigone or bladder or bladder neck and the opening in question was in the mid urethra to the patient's right side of the midline. I could see the tip of my finger through the opening now approximately 7 mm in size like a linear slit and this was visualized cystoscopically.  With appropriate traction on the opened diverticulum I could see a fishmouth opening in the urethra that I closed with thick tissue with running 3-0 Vicryl. I went beyond the apex well on both ends. I removed several millimeters of the diverticulum. I do not expect there to be much for pathology. I then did a vest over pants closure of pubocervical fascia tension-free with interrupted 2-0 Vicryl on the first flap and running 2-0 Vicryl on the second flap.  Irrigation was used throughout the case. I then trimmed the tip of  the anterior vaginal wall flap and closed from left to right in the midline and right to left midline. Hemostasis was excellent. Double vaginal pack per vaginal pressure hemostasis was applied. Leg position was excellent. Urine was clear. Urine output was good.  I was very pleased with the surgery. It was a very challenging case. The tissues were very thickened and inflamed. Having said that I was very  happy with the closure and excision and hopefully the patient will reach her treatment goal

## 2014-01-09 NOTE — Transfer of Care (Signed)
Immediate Anesthesia Transfer of Care Note  Patient: [name]  Procedure(s) Performed: Procedure(s): CYSTOSCOPY (N/A) Excision of URETHRAL DIVERTICULUM (N/A)  Patient Location: PACU  Anesthesia Type:General  Level of Consciousness: awake and oriented  Airway & Oxygen Therapy: Patient Spontanous Breathing and Patient connected to face mask oxygen  Post-op Assessment: Report given to PACU RN and Post -op Vital signs reviewed and stable  Post vital signs: Reviewed and stable  Complications: No apparent anesthesia complications

## 2014-01-09 NOTE — Anesthesia Preprocedure Evaluation (Addendum)
Anesthesia Evaluation  Patient identified by MRN, date of birth, ID band Patient awake    Reviewed: Allergy & Precautions, H&P , NPO status , Patient's Chart, lab work & pertinent test results  Airway Mallampati: II TM Distance: >3 FB Neck ROM: Full    Dental no notable dental hx.    Pulmonary asthma ,  breath sounds clear to auscultation  Pulmonary exam normal       Cardiovascular negative cardio ROS  Rhythm:Regular Rate:Normal     Neuro/Psych negative neurological ROS  negative psych ROS   GI/Hepatic negative GI ROS, Neg liver ROS,   Endo/Other  Morbid obesity  Renal/GU negative Renal ROS  negative genitourinary   Musculoskeletal  (+) Fibromyalgia -  Abdominal   Peds negative pediatric ROS (+)  Hematology negative hematology ROS (+)   Anesthesia Other Findings   Reproductive/Obstetrics negative OB ROS                         Anesthesia Physical Anesthesia Plan  ASA: III  Anesthesia Plan: General   Post-op Pain Management:    Induction: Intravenous  Airway Management Planned: LMA and Oral ETT  Additional Equipment:   Intra-op Plan:   Post-operative Plan: Extubation in OR  Informed Consent: I have reviewed the patients History and Physical, chart, labs and discussed the procedure including the risks, benefits and alternatives for the proposed anesthesia with the patient or authorized representative who has indicated his/her understanding and acceptance.   Dental advisory given  Plan Discussed with: CRNA  Anesthesia Plan Comments:        Anesthesia Quick Evaluation

## 2014-01-09 NOTE — Progress Notes (Signed)
Assumed care of pt. No change in initial am assessment at this time. Cont with plan of care 

## 2014-01-09 NOTE — Anesthesia Procedure Notes (Signed)
Procedure Name: Intubation Date/Time: 01/09/2014 7:38 AM Performed by: Leroy Libman L Patient Re-evaluated:Patient Re-evaluated prior to inductionOxygen Delivery Method: Circle system utilized Preoxygenation: Pre-oxygenation with 100% oxygen Intubation Type: IV induction Ventilation: Mask ventilation without difficulty and Oral airway inserted - appropriate to patient size Laryngoscope Size: Hyacinth Meeker and 2 Grade View: Grade I Tube type: Oral Tube size: 7.5 mm Number of attempts: 1 Airway Equipment and Method: Stylet Placement Confirmation: breath sounds checked- equal and bilateral,  positive ETCO2 and ETT inserted through vocal cords under direct vision Secured at: 20 cm Tube secured with: Tape Dental Injury: Teeth and Oropharynx as per pre-operative assessment

## 2014-01-09 NOTE — Interval H&P Note (Signed)
History and Physical Interval Note:  01/09/2014 7:12 AM  [name]  has presented today for surgery, with the diagnosis of URETHRAL DIVERTICULUM  The various methods of treatment have been discussed with the patient and family. After consideration of risks, benefits and other options for treatment, the patient has consented to  Procedure(s): CYSTOSCOPY (N/A) REPAIR URETHRAL DIVERTICULUM (N/A) as a surgical intervention .  The patient's history has been reviewed, patient examined, no change in status, stable for surgery.  I have reviewed the patient's chart and labs.  Questions were answered to the patient's satisfaction.     Shirell Struthers A

## 2014-01-09 NOTE — Anesthesia Postprocedure Evaluation (Signed)
  Anesthesia Post-op Note  Patient: [name]  Procedure(s) Performed: Procedure(s) (LRB): CYSTOSCOPY (N/A) Excision of URETHRAL DIVERTICULUM (N/A)  Patient Location: PACU  Anesthesia Type: General  Level of Consciousness: awake and alert   Airway and Oxygen Therapy: Patient Spontanous Breathing  Post-op Pain: mild  Post-op Assessment: Post-op Vital signs reviewed, Patient's Cardiovascular Status Stable, Respiratory Function Stable, Patent Airway and No signs of Nausea or vomiting  Last Vitals:  Filed Vitals:   01/09/14 1152  BP: 113/52  Pulse: 80  Temp: 36.3 C  Resp: 16    Post-op Vital Signs: stable   Complications: No apparent anesthesia complications

## 2014-01-10 ENCOUNTER — Encounter (HOSPITAL_COMMUNITY): Payer: Self-pay | Admitting: Urology

## 2014-01-10 DIAGNOSIS — N361 Urethral diverticulum: Secondary | ICD-10-CM | POA: Diagnosis not present

## 2014-01-10 LAB — HEMOGLOBIN AND HEMATOCRIT, BLOOD
HCT: 31.7 % — ABNORMAL LOW (ref 36.0–46.0)
Hemoglobin: 10.1 g/dL — ABNORMAL LOW (ref 12.0–15.0)

## 2014-01-10 LAB — BASIC METABOLIC PANEL
Anion gap: 13 (ref 5–15)
BUN: 7 mg/dL (ref 6–23)
CALCIUM: 8.7 mg/dL (ref 8.4–10.5)
CO2: 23 mEq/L (ref 19–32)
Chloride: 104 mEq/L (ref 96–112)
Creatinine, Ser: 0.74 mg/dL (ref 0.50–1.10)
Glucose, Bld: 174 mg/dL — ABNORMAL HIGH (ref 70–99)
Potassium: 4.5 mEq/L (ref 3.7–5.3)
Sodium: 140 mEq/L (ref 137–147)

## 2014-01-10 NOTE — Discharge Summary (Signed)
Date of admission: 01/09/2014  Date of discharge: 01/10/2014  Admission diagnosis: Urethral diveticulum  Discharge diagnosis: Urethral diverticulum  Secondary diagnoses: none  History and Physical: For full details, please see admission history and physical. Briefly, [name] is a 33 y.o. year old patient with the above diagnosis.   Hospital Course: Surgery went well with uneventful post op course  Laboratory values:  Recent Labs  01/09/14 1601 01/10/14 0428  HGB 11.4* 10.1*  HCT 35.4* 31.7*    Recent Labs  01/10/14 0428  CREATININE 0.74    Disposition: Home  Discharge instruction: The patient was instructed to be ambulatory but told to refrain from heavy lifting, strenuous activity, or driving. Discussed in detail  Discharge medications:    Medication List    STOP taking these medications       nitrofurantoin 100 MG capsule  Commonly known as:  MACRODANTIN     sulfamethoxazole-trimethoprim 800-160 MG per tablet  Commonly known as:  BACTRIM DS      TAKE these medications       albuterol 108 (90 BASE) MCG/ACT inhaler  Commonly known as:  PROVENTIL HFA;VENTOLIN HFA  Inhale 2 puffs into the lungs every 6 (six) hours as needed. For shortness of breath     ciprofloxacin 250 MG tablet  Commonly known as:  CIPRO  Take 1 tablet (250 mg total) by mouth 2 (two) times daily.     DULoxetine 30 MG capsule  Commonly known as:  CYMBALTA  Take 30 mg by mouth at bedtime.     HYDROcodone-acetaminophen 10-325 MG per tablet  Commonly known as:  NORCO  Take 1 tablet by mouth every 6 (six) hours as needed for moderate pain or severe pain.     pregabalin 75 MG capsule  Commonly known as:  LYRICA  Take 1 capsule (75 mg total) by mouth 2 (two) times daily.        Followup:      Follow-up Information   Follow up with Fallen Crisostomo A, MD. (office will call with date and time)    Specialty:  Urology   Contact information:   [address] [place]  [address] [phone]       Follow up with Alexandre Faries A, MD. (as scheduled)    Specialty:  Urology   Contact information:   [address] [place] [address] [phone]

## 2014-01-10 NOTE — Progress Notes (Signed)
Minimal pain Vitals and labs normal Instructions given

## 2014-01-10 NOTE — Discharge Instructions (Signed)
As discussed with Dr. Deneka Greenwalt.I have reviewed discharge instructions in detail with the patient. They will follow-up with me or their physician as scheduled. My nurse will also be calling the patients as per protocol.   °

## 2014-01-18 ENCOUNTER — Other Ambulatory Visit (HOSPITAL_COMMUNITY): Payer: Self-pay | Admitting: Urology

## 2014-01-18 DIAGNOSIS — N361 Urethral diverticulum: Secondary | ICD-10-CM

## 2014-01-29 ENCOUNTER — Ambulatory Visit (HOSPITAL_COMMUNITY)
Admission: RE | Admit: 2014-01-29 | Discharge: 2014-01-29 | Disposition: A | Discharge status: Home / Self Care | Payer: Medicaid Other | Source: Ambulatory Visit | Attending: Urology | Admitting: Urology

## 2014-01-29 ENCOUNTER — Other Ambulatory Visit (HOSPITAL_COMMUNITY): Payer: Self-pay | Admitting: Urology

## 2014-01-29 DIAGNOSIS — Z9889 Other specified postprocedural states: Secondary | ICD-10-CM | POA: Insufficient documentation

## 2014-01-29 DIAGNOSIS — N361 Urethral diverticulum: Secondary | ICD-10-CM

## 2014-01-29 MED ORDER — DIATRIZOATE MEGLUMINE 30 % UR SOLN
Freq: Once | URETHRAL | Status: AC | PRN
  Administered 2014-01-29: 200 mL

## 2014-02-12 ENCOUNTER — Other Ambulatory Visit (HOSPITAL_COMMUNITY): Payer: Self-pay | Admitting: Urology

## 2014-02-12 ENCOUNTER — Ambulatory Visit (HOSPITAL_COMMUNITY)
Admission: RE | Admit: 2014-02-12 | Discharge: 2014-02-12 | Disposition: A | Discharge status: Home / Self Care | Payer: Medicaid Other | Source: Ambulatory Visit | Attending: Urology | Admitting: Urology

## 2014-02-12 DIAGNOSIS — N361 Urethral diverticulum: Secondary | ICD-10-CM

## 2014-02-12 MED ORDER — DIATRIZOATE MEGLUMINE 30 % UR SOLN
Freq: Once | URETHRAL | Status: AC | PRN
  Administered 2014-02-12: 250 mL

## 2014-02-26 ENCOUNTER — Ambulatory Visit (HOSPITAL_COMMUNITY)
Admission: RE | Admit: 2014-02-26 | Discharge: 2014-02-26 | Disposition: A | Discharge status: Home / Self Care | Payer: Medicaid Other | Source: Ambulatory Visit | Attending: Urology | Admitting: Urology

## 2014-02-26 DIAGNOSIS — N361 Urethral diverticulum: Secondary | ICD-10-CM | POA: Diagnosis not present

## 2014-02-26 MED ORDER — DIATRIZOATE MEGLUMINE 30 % UR SOLN
Freq: Once | URETHRAL | Status: AC | PRN
  Administered 2014-02-26: 300 mL

## 2014-03-05 ENCOUNTER — Ambulatory Visit (HOSPITAL_COMMUNITY): Payer: Medicaid Other

## 2014-03-19 ENCOUNTER — Encounter (HOSPITAL_COMMUNITY): Payer: Self-pay | Admitting: Urology

## 2014-05-15 ENCOUNTER — Ambulatory Visit: Payer: Medicaid Other | Attending: Urology | Admitting: Physical Therapy

## 2015-03-19 HISTORY — PX: HEEL SPUR SURGERY: SHX665

## 2015-05-28 IMAGING — RF DG VCUG
14 of 20 series · 14 of 20 positions shown · non-contrast
Comparison: Pelvic MRI 07/10/2013

CLINICAL DATA: Repair of urethral diverticulum 01/09/2014.
Postoperative exam.

EXAM:
VOIDING CYSTOURETHROGRAM
TECHNIQUE: After catheterization of the urinary bladder following sterile
technique by nursing personnel, the bladder was filled with 200 ml
Cysto-hypaque 30% by drip infusion. Serial spot images were obtained
during bladder filling and voiding.
FLUOROSCOPY TIME:  2 min 41 seconds

[Series 1: run · 1 of 1 slices shown (1 of 14)]
[im 1/1]
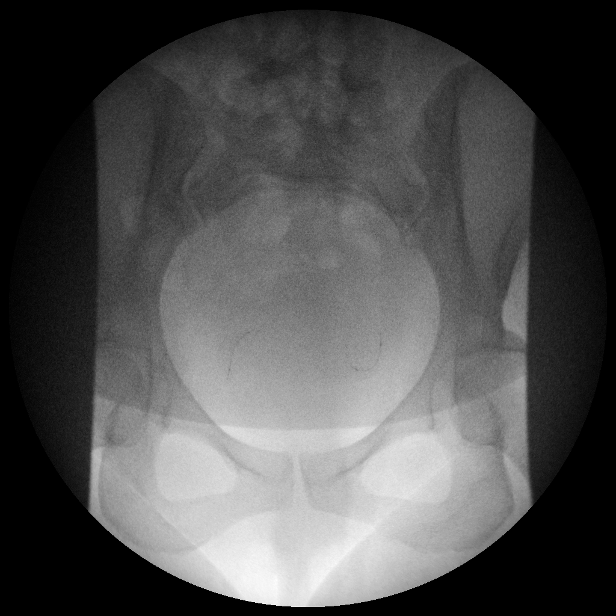

[Series 3: run · 1 of 1 slices shown (2 of 14)]
[im 1/1]
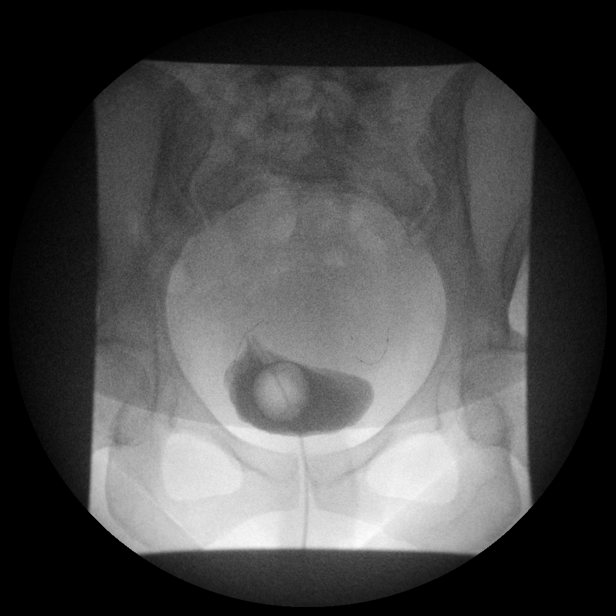

[Series 4: run · 1 of 1 slices shown (3 of 14)]
[im 1/1]
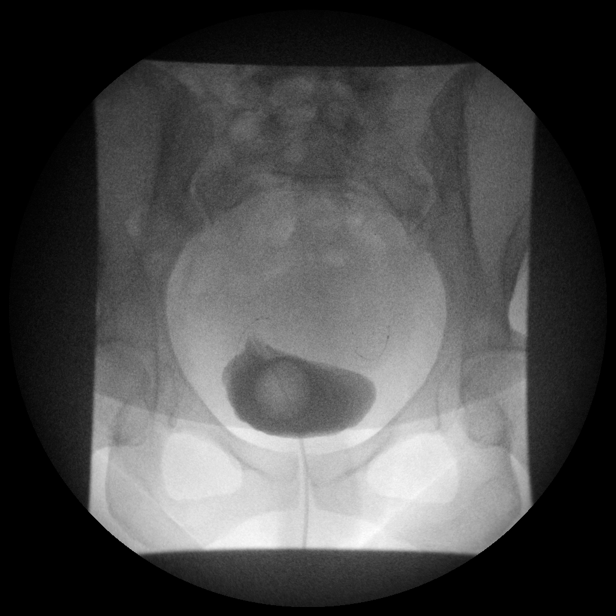

[Series 6: run · 1 of 1 slices shown (4 of 14)]
[im 1/1]
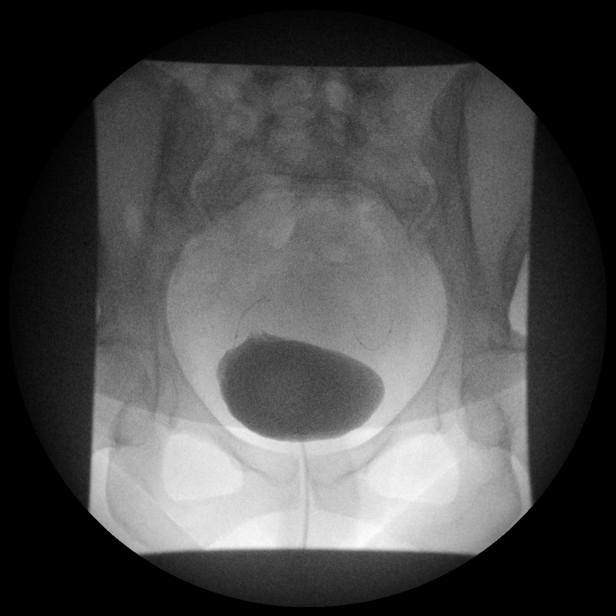

[Series 7: run · 1 of 1 slices shown (5 of 14)]
[im 1/1]
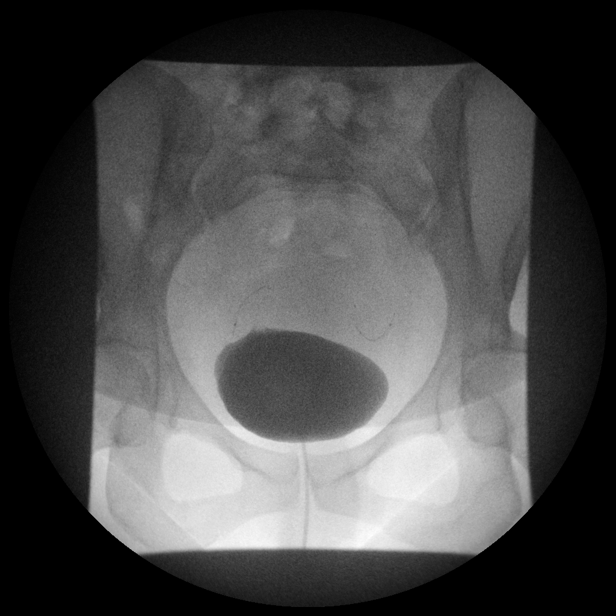

[Series 8: run · 1 of 1 slices shown (6 of 14)]
[im 1/1]
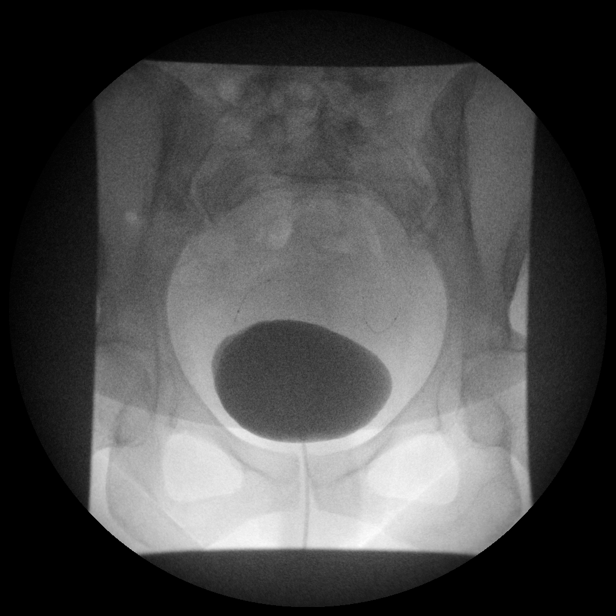

[Series 10: run · 1 of 1 slices shown (7 of 14)]
[im 1/1]
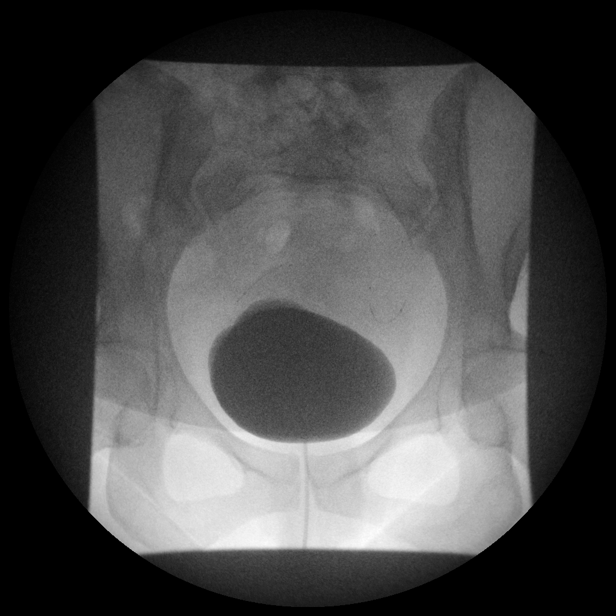

[Series 11: run · 1 of 1 slices shown (8 of 14)]
[im 1/1]
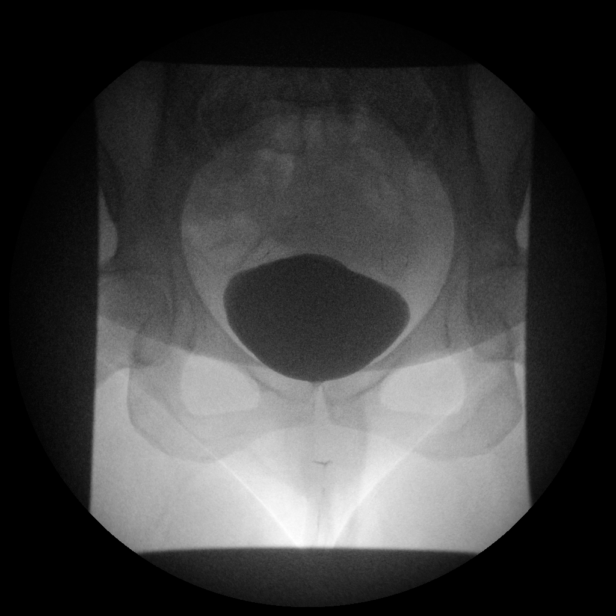

[Series 13: run · 1 of 1 slices shown (9 of 14)]
[im 1/1]
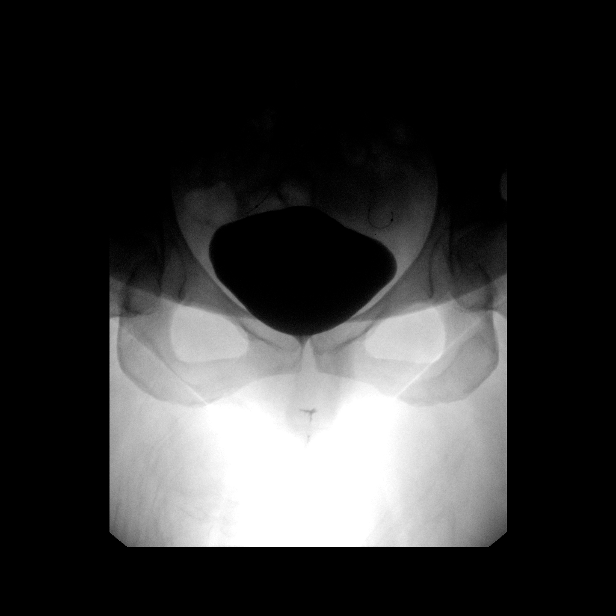

[Series 14: run · 1 of 1 slices shown (10 of 14)]
[im 1/1]
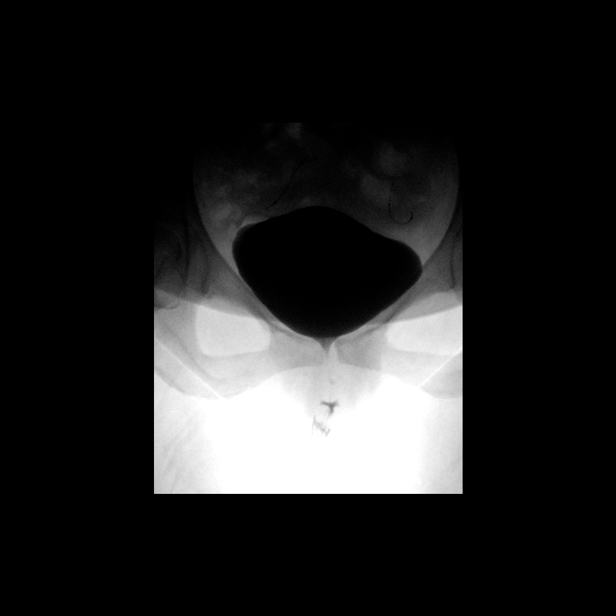

[Series 16: run · 1 of 1 slices shown (11 of 14)]
[im 1/1]
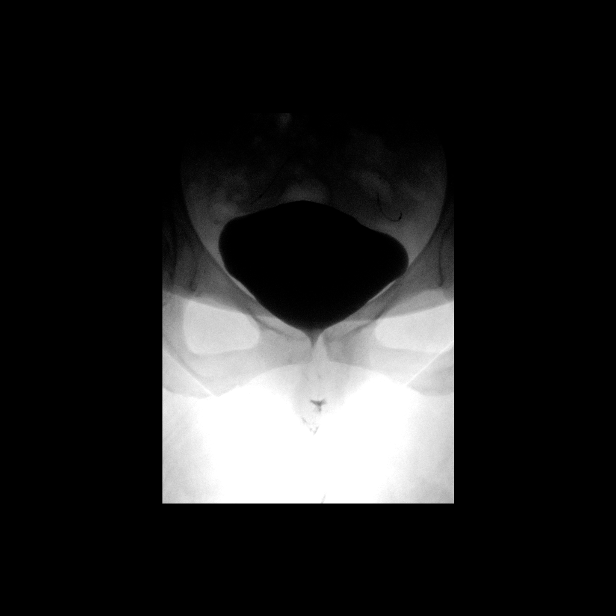

[Series 17: run · 1 of 1 slices shown (12 of 14)]
[im 1/1]
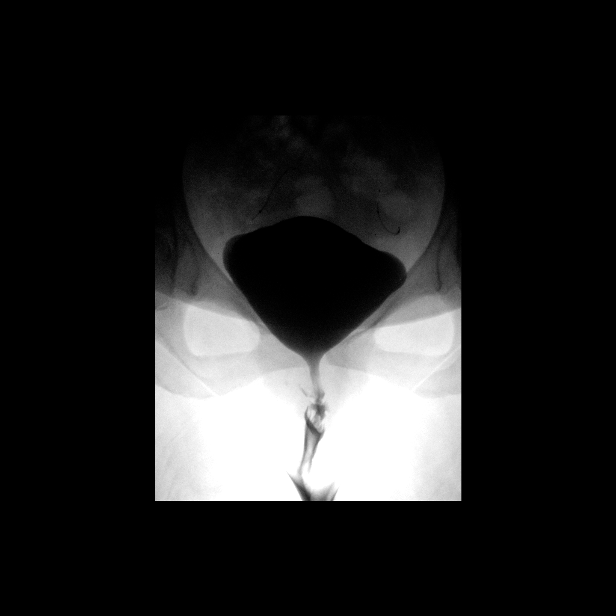

[Series 18: run · 1 of 1 slices shown (13 of 14)]
[im 1/1]
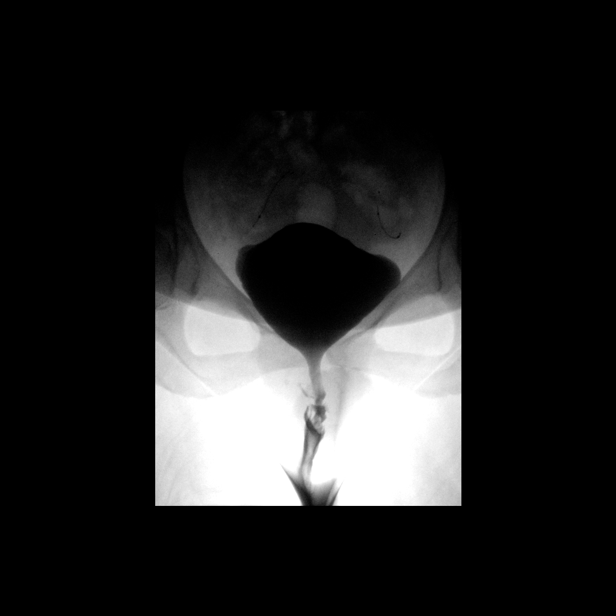

[Series 20: run · 1 of 1 slices shown (14 of 14)]
[im 1/1]
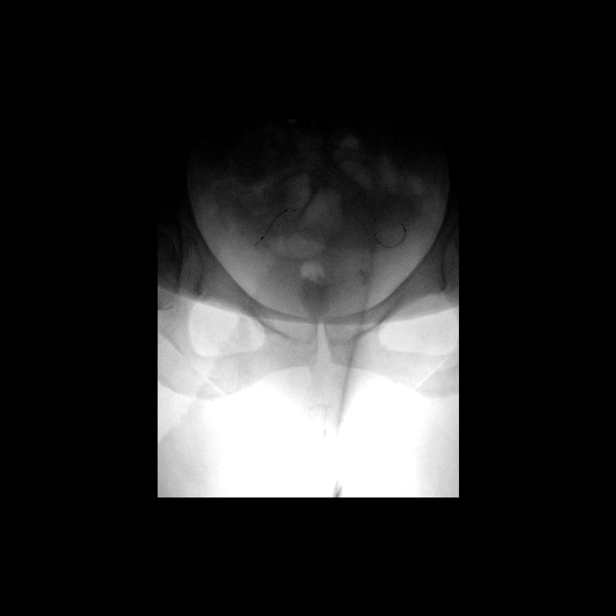

[14 of 20 positions shown; findings below may reference images not displayed]

FINDINGS: Essure implants are noted over the pelvis. The bladder distends
normally with gentle drip infusion of contrast. No bladder
diverticulum or outpouching is otherwise scratch but no abnormal
bladder contour is identified. At voiding, on several instances,
there is thin linear extension of contrast in the right distal
posterior lateral urethral region. No filling of a focal urethral
outpouching is seen to suggest a persistent diverticulum per se. The
patient was able to void to completion and immediate postvoid
imaging demonstrated resolution of the linear area of contrast
extravasation.
IMPRESSION: Abnormal right posterior lateral distal urethral extravasation of
contrast. Findings discussed with the patient and Dr. Alek by
Dr. Ramnath at the time of imaging on 01/29/2014.

## 2015-07-18 ENCOUNTER — Encounter (HOSPITAL_COMMUNITY): Payer: Self-pay | Admitting: Emergency Medicine

## 2015-07-18 ENCOUNTER — Inpatient Hospital Stay (HOSPITAL_COMMUNITY)
Admission: EM | Admit: 2015-07-18 | Discharge: 2015-07-21 | DRG: 194 | Disposition: A | Discharge status: Home / Self Care | Payer: Medicaid Other | Attending: Internal Medicine | Admitting: Internal Medicine

## 2015-07-18 ENCOUNTER — Emergency Department (HOSPITAL_COMMUNITY): Payer: Medicaid Other

## 2015-07-18 DIAGNOSIS — Z823 Family history of stroke: Secondary | ICD-10-CM

## 2015-07-18 DIAGNOSIS — Z825 Family history of asthma and other chronic lower respiratory diseases: Secondary | ICD-10-CM

## 2015-07-18 DIAGNOSIS — J45901 Unspecified asthma with (acute) exacerbation: Secondary | ICD-10-CM | POA: Diagnosis not present

## 2015-07-18 DIAGNOSIS — E876 Hypokalemia: Secondary | ICD-10-CM | POA: Diagnosis present

## 2015-07-18 DIAGNOSIS — J101 Influenza due to other identified influenza virus with other respiratory manifestations: Secondary | ICD-10-CM | POA: Diagnosis not present

## 2015-07-18 DIAGNOSIS — Z6841 Body Mass Index (BMI) 40.0 and over, adult: Secondary | ICD-10-CM | POA: Diagnosis not present

## 2015-07-18 DIAGNOSIS — Z8249 Family history of ischemic heart disease and other diseases of the circulatory system: Secondary | ICD-10-CM | POA: Diagnosis not present

## 2015-07-18 DIAGNOSIS — N393 Stress incontinence (female) (male): Secondary | ICD-10-CM | POA: Diagnosis present

## 2015-07-18 DIAGNOSIS — M797 Fibromyalgia: Secondary | ICD-10-CM | POA: Diagnosis present

## 2015-07-18 LAB — COMPREHENSIVE METABOLIC PANEL
ALT: 12 U/L — ABNORMAL LOW (ref 14–54)
AST: 16 U/L (ref 15–41)
Albumin: 4 g/dL (ref 3.5–5.0)
Alkaline Phosphatase: 82 U/L (ref 38–126)
Anion gap: 14 (ref 5–15)
BILIRUBIN TOTAL: 0.5 mg/dL (ref 0.3–1.2)
BUN: 10 mg/dL (ref 6–20)
CO2: 23 mmol/L (ref 22–32)
CREATININE: 0.92 mg/dL (ref 0.44–1.00)
Calcium: 9.1 mg/dL (ref 8.9–10.3)
Chloride: 104 mmol/L (ref 101–111)
GFR calc Af Amer: >60 mL/min (ref >60)
Glucose, Bld: 105 mg/dL — ABNORMAL HIGH (ref 65–99)
POTASSIUM: 3 mmol/L — AB (ref 3.5–5.1)
Sodium: 141 mmol/L (ref 135–145)
TOTAL PROTEIN: 7.8 g/dL (ref 6.5–8.1)

## 2015-07-18 LAB — CBC WITH DIFFERENTIAL/PLATELET
Basophils Absolute: 0 10*3/uL (ref 0.0–0.1)
Basophils Relative: 0 %
Eosinophils Absolute: 0 10*3/uL (ref 0.0–0.7)
Eosinophils Relative: 0 %
HEMATOCRIT: 39.5 % (ref 36.0–46.0)
Hemoglobin: 12.4 g/dL (ref 12.0–15.0)
LYMPHS ABS: 1.6 10*3/uL (ref 0.7–4.0)
LYMPHS PCT: 26 %
MCH: 25.5 pg — AB (ref 26.0–34.0)
MCHC: 31.4 g/dL (ref 30.0–36.0)
MCV: 81.3 fL (ref 78.0–100.0)
MONO ABS: 0.5 10*3/uL (ref 0.1–1.0)
MONOS PCT: 9 %
NEUTROS ABS: 4 10*3/uL (ref 1.7–7.7)
Neutrophils Relative %: 65 %
Platelets: 238 10*3/uL (ref 150–400)
RBC: 4.86 MIL/uL (ref 3.87–5.11)
RDW: 14.6 % (ref 11.5–15.5)
WBC: 6.1 10*3/uL (ref 4.0–10.5)

## 2015-07-18 MED ORDER — HYDROCODONE-ACETAMINOPHEN 5-325 MG PO TABS
1.0000 | ORAL_TABLET | ORAL | Status: DC | PRN
  Administered 2015-07-18 – 2015-07-20 (×4): 1 via ORAL
  Filled 2015-07-18 (×4): qty 1

## 2015-07-18 MED ORDER — DOXYCYCLINE HYCLATE 100 MG IV SOLR
100.0000 mg | Freq: Two times a day (BID) | INTRAVENOUS | Status: DC
  Administered 2015-07-18 – 2015-07-20 (×4): 100 mg via INTRAVENOUS
  Filled 2015-07-18 (×5): qty 100

## 2015-07-18 MED ORDER — GABAPENTIN 300 MG PO CAPS
300.0000 mg | ORAL_CAPSULE | Freq: Three times a day (TID) | ORAL | Status: DC
  Administered 2015-07-18 – 2015-07-21 (×8): 300 mg via ORAL
  Filled 2015-07-18 (×10): qty 1

## 2015-07-18 MED ORDER — IPRATROPIUM-ALBUTEROL 0.5-2.5 (3) MG/3ML IN SOLN
3.0000 mL | Freq: Once | RESPIRATORY_TRACT | Status: AC
  Administered 2015-07-18: 3 mL via RESPIRATORY_TRACT
  Filled 2015-07-18: qty 3

## 2015-07-18 MED ORDER — POTASSIUM CHLORIDE 20 MEQ/15ML (10%) PO SOLN
40.0000 meq | Freq: Once | ORAL | Status: AC
  Administered 2015-07-18: 40 meq via ORAL
  Filled 2015-07-18: qty 30

## 2015-07-18 MED ORDER — OSELTAMIVIR PHOSPHATE 75 MG PO CAPS
75.0000 mg | ORAL_CAPSULE | Freq: Two times a day (BID) | ORAL | Status: DC
  Administered 2015-07-18 – 2015-07-21 (×6): 75 mg via ORAL
  Filled 2015-07-18 (×7): qty 1

## 2015-07-18 MED ORDER — LEVOFLOXACIN 500 MG PO TABS
500.0000 mg | ORAL_TABLET | Freq: Once | ORAL | Status: AC
  Administered 2015-07-18: 500 mg via ORAL
  Filled 2015-07-18: qty 1

## 2015-07-18 MED ORDER — ALBUTEROL SULFATE (2.5 MG/3ML) 0.083% IN NEBU
5.0000 mg | INHALATION_SOLUTION | Freq: Once | RESPIRATORY_TRACT | Status: AC
  Administered 2015-07-18: 5 mg via RESPIRATORY_TRACT
  Filled 2015-07-18: qty 6

## 2015-07-18 MED ORDER — LORAZEPAM 2 MG/ML IJ SOLN
0.5000 mg | Freq: Once | INTRAMUSCULAR | Status: AC
  Administered 2015-07-18: 0.5 mg via INTRAMUSCULAR
  Filled 2015-07-18: qty 1

## 2015-07-18 MED ORDER — SENNOSIDES-DOCUSATE SODIUM 8.6-50 MG PO TABS: 1.0000 | ORAL_TABLET | Freq: Every evening | ORAL | Status: DC | PRN

## 2015-07-18 MED ORDER — METHYLPREDNISOLONE SODIUM SUCC 125 MG IJ SOLR
80.0000 mg | Freq: Four times a day (QID) | INTRAMUSCULAR | Status: DC
  Administered 2015-07-18 – 2015-07-20 (×7): 80 mg via INTRAVENOUS
  Filled 2015-07-18 (×10): qty 1.28

## 2015-07-18 MED ORDER — ONDANSETRON HCL 4 MG PO TABS: 4.0000 mg | ORAL_TABLET | Freq: Four times a day (QID) | ORAL | Status: DC | PRN

## 2015-07-18 MED ORDER — PANTOPRAZOLE SODIUM 40 MG PO TBEC
40.0000 mg | DELAYED_RELEASE_TABLET | Freq: Every day | ORAL | Status: DC
  Administered 2015-07-18 – 2015-07-20 (×3): 40 mg via ORAL
  Filled 2015-07-18 (×4): qty 1

## 2015-07-18 MED ORDER — MONTELUKAST SODIUM 10 MG PO TABS
10.0000 mg | ORAL_TABLET | Freq: Every day | ORAL | Status: DC
  Administered 2015-07-18 – 2015-07-20 (×3): 10 mg via ORAL
  Filled 2015-07-18 (×4): qty 1

## 2015-07-18 MED ORDER — ENOXAPARIN SODIUM 40 MG/0.4ML ~~LOC~~ SOLN
40.0000 mg | SUBCUTANEOUS | Status: DC
  Administered 2015-07-18: 40 mg via SUBCUTANEOUS
  Filled 2015-07-18 (×3): qty 0.4

## 2015-07-18 MED ORDER — ONDANSETRON HCL 4 MG/2ML IJ SOLN
4.0000 mg | Freq: Four times a day (QID) | INTRAMUSCULAR | Status: DC | PRN
  Administered 2015-07-20: 4 mg via INTRAVENOUS
  Filled 2015-07-18: qty 2

## 2015-07-18 MED ORDER — ACETAMINOPHEN 650 MG RE SUPP: 650.0000 mg | Freq: Four times a day (QID) | RECTAL | Status: DC | PRN

## 2015-07-18 MED ORDER — LORAZEPAM 2 MG/ML IJ SOLN: 0.5000 mg | Freq: Once | INTRAMUSCULAR | Status: DC

## 2015-07-18 MED ORDER — POTASSIUM CHLORIDE CRYS ER 20 MEQ PO TBCR
40.0000 meq | EXTENDED_RELEASE_TABLET | Freq: Once | ORAL | Status: AC
  Administered 2015-07-18: 40 meq via ORAL
  Filled 2015-07-18: qty 2

## 2015-07-18 MED ORDER — SODIUM CHLORIDE 0.9 % IV SOLN
INTRAVENOUS | Status: DC
  Administered 2015-07-18 – 2015-07-19 (×2): via INTRAVENOUS

## 2015-07-18 MED ORDER — GUAIFENESIN-CODEINE 100-10 MG/5ML PO SOLN
10.0000 mL | Freq: Four times a day (QID) | ORAL | Status: DC | PRN
  Administered 2015-07-19 – 2015-07-20 (×4): 10 mL via ORAL
  Filled 2015-07-18 (×4): qty 10

## 2015-07-18 MED ORDER — ALBUTEROL SULFATE (2.5 MG/3ML) 0.083% IN NEBU: 2.5000 mg | INHALATION_SOLUTION | RESPIRATORY_TRACT | Status: DC | PRN

## 2015-07-18 MED ORDER — ACETAMINOPHEN 325 MG PO TABS: 650.0000 mg | ORAL_TABLET | Freq: Four times a day (QID) | ORAL | Status: DC | PRN

## 2015-07-18 MED ORDER — MORPHINE SULFATE (PF) 2 MG/ML IV SOLN
2.0000 mg | INTRAVENOUS | Status: DC | PRN
  Administered 2015-07-18 – 2015-07-21 (×8): 2 mg via INTRAVENOUS
  Filled 2015-07-18 (×8): qty 1

## 2015-07-18 MED ORDER — TIZANIDINE HCL 4 MG PO TABS
4.0000 mg | ORAL_TABLET | Freq: Two times a day (BID) | ORAL | Status: DC
  Administered 2015-07-18 – 2015-07-21 (×6): 4 mg via ORAL
  Filled 2015-07-18 (×7): qty 1

## 2015-07-18 MED ORDER — IPRATROPIUM BROMIDE 0.02 % IN SOLN
0.5000 mg | Freq: Once | RESPIRATORY_TRACT | Status: AC
  Administered 2015-07-18: 0.5 mg via RESPIRATORY_TRACT
  Filled 2015-07-18: qty 2.5

## 2015-07-18 MED ORDER — ALUM & MAG HYDROXIDE-SIMETH 200-200-20 MG/5ML PO SUSP: 30.0000 mL | Freq: Four times a day (QID) | ORAL | Status: DC | PRN

## 2015-07-18 MED ORDER — ALBUTEROL SULFATE (2.5 MG/3ML) 0.083% IN NEBU
2.5000 mg | INHALATION_SOLUTION | Freq: Once | RESPIRATORY_TRACT | Status: AC
  Administered 2015-07-18: 2.5 mg via RESPIRATORY_TRACT
  Filled 2015-07-18: qty 3

## 2015-07-18 MED ORDER — ZOLPIDEM TARTRATE 5 MG PO TABS: 5.0000 mg | ORAL_TABLET | Freq: Every evening | ORAL | Status: DC | PRN

## 2015-07-18 NOTE — H&P (Addendum)
Patient Demographics  [name], is a 35 y.o. female  MRN: [id number]   DOB - 02/28/1981  Admit Date - 07/18/2015  Outpatient Primary MD for the patient is Oliver Pila, MD   Assessment [name]  is a pleasant 35 y.o. female with morbid obesity, moderate persistent BA , who comes in with worsening sob associated with subjective fever, generalized  Muscle aches and cough productive of brownish colored sputum for the past 1 week, not responding to outpatient interventions including prednisone,, she has sick family members including her son/husband hence concern for the flu. She therefore has acute exacerbation of BA with possible CAP. CXR is unrevealing, labs show wbc 6.100, potassium 3. Clinical exam is significant for wheezing, low grade temp of 99.86F and tachycardia. She has been given systemic steroids/bronchodilators, Levaquin in the ED and referred for admission. Of note is that she started Tamiflu course yesterday(prescribed at the urgent care center). She will be admitted to med surg for further management including UA/UC/Urine legionella antigen/sputum culture, Doxycycline/Tamiflu, systemic steroids/bronchodilators/O2 supplementation as needed. Patient is full code.   Plan Asthma exacerbation, ?Influenza  Admit med surg  Sputum culture/Urine legionella  Tamiflu/Doxycycline  Solumedrol/Albuterol/O2 as needed/Singulair Acute hypokalemia  Replenish electrolytes as needed.   Morbid obesity with body mass index of 45.0-49.9 in adult [facility])  Life style changes.   DVT/GI Prophylaxis  Lovenox  Protonix Family Communication: Admission, patients condition and plan of care including tests being ordered have been discussed with the patient and husband who indicate understanding and agree with the plan and Code Status. Code Status   Full Code Likely DC  Home  Condition GUARDED    Time spent in minutes : 50  Chief Complaint Chief Complaint  Patient  presents with  . Shortness of Breath     HPI Patient says February 1st she had sinus infection treated with Zpack. This got better, but Thursday/Friday last week she started wheezing, coughing, and tried to manage at home, but had to go to urgent care sartuday, as she was not getting better, wheezing. She did not improve despite prednisone. She had Fever of 105F this morning, and took advil. Her PCP prescribed Tamiflu which she took yesterday. Son/sister had the flu. She has had Flu like symptoms including muscle aches and leg pains.   Review of Systems   As in the HPI above.   Past Medical History  Diagnosis Date  . Asthma   . Fibromyalgia   . Family history of anesthesia complication     mother stopped breathing and survived   . Migraine     tension induced   . Stress incontinence   . Anemia     with pregnancy      Past Surgical History  Procedure Laterality Date  . Tympanostomy tube placement    . Hernia repair    . Tonsillectomy    . Adenoidectomy    . Cystoscopy N/A 01/09/2014    Procedure: CYSTOSCOPY;  Surgeon: Martina Sinner, MD;  Location: [facility];  Service: Urology;  Laterality: N/A;  . Urethral diverticulum repair N/A 01/09/2014    Procedure: Excision of URETHRAL DIVERTICULUM;  Surgeon: Martina Sinner, MD;  Location: [facility];  Service: Urology;  Laterality: N/A;  . Heel spur surgery  nov. 1, 2016    tendon surgery also    Social History Social History  Substance Use Topics  . Smoking status: Never Smoker   . Smokeless tobacco: Never Used  . Alcohol Use:  No    Family History Family History  Problem Relation Age of Onset  . Heart disease Mother   . Asthma Mother   . COPD Mother   . Stroke Father   . Seizures Father   . Heart disease Maternal Grandmother     Prior to Admission medications   Medication Sig Start Date End Date Taking? Authorizing Provider  acetaminophen-codeine (TYLENOL #3) 300-30 MG tablet Take 1 tablet by mouth at bedtime as needed  for moderate pain.   Yes Historical Provider, MD  albuterol (PROVENTIL HFA;VENTOLIN HFA) 108 (90 BASE) MCG/ACT inhaler Inhale 2 puffs into the lungs every 4 (four) hours as needed for wheezing or shortness of breath. For shortness of breath 04/24/11  Yes Adlih Moreno-Coll, MD  Doxylamine-DM (SAFE TUSSIN PM) 3.125-7.5 MG/5ML LIQD Take 10 mLs by mouth 2 (two) times daily as needed (for cough).   Yes Historical Provider, MD  Fluticasone-Salmeterol (ADVAIR) 250-50 MCG/DOSE AEPB Inhale 1 puff into the lungs daily.   Yes Historical Provider, MD  gabapentin (NEURONTIN) 300 MG capsule Take 300 mg by mouth 3 (three) times daily.   Yes Historical Provider, MD  oseltamivir (TAMIFLU) 75 MG capsule Take 75 mg by mouth 2 (two) times daily. Started 03/01 for 5 days   Yes Historical Provider, MD  predniSONE (DELTASONE) 20 MG tablet Take 20 mg by mouth 2 (two) times daily with a meal. Started 02/28 for 5 days   Yes Historical Provider, MD  tiZANidine (ZANAFLEX) 4 MG tablet Take 4 mg by mouth 2 (two) times daily.   Yes Historical Provider, MD  pregabalin (LYRICA) 75 MG capsule Take 1 capsule (75 mg total) by mouth 2 (two) times daily. Patient not taking: Reported on 07/18/2015 09/11/13   Ramond Marrow, DO    Allergies  Allergen Reactions  . Amoxicillin Hives    Has patient had a PCN reaction causing immediate rash, facial/tongue/throat swelling, SOB or lightheadedness with hypotension: Yes Has patient had a PCN reaction causing severe rash involving mucus membranes or skin necrosis: Yes Has patient had a PCN reaction that required hospitalization No Has patient had a PCN reaction occurring within the last 10 years: No If all of the above answers are "NO", then may proceed with Cephalosporin use.   . Latex Other (See Comments)    Burns and blisters skin  . Penicillins Hives    Has patient had a PCN reaction causing immediate rash, facial/tongue/throat swelling, SOB or lightheadedness with hypotension: Yes Has  patient had a PCN reaction causing severe rash involving mucus membranes or skin necrosis: Yes Has patient had a PCN reaction that required hospitalization No Has patient had a PCN reaction occurring within the last 10 years: No If all of the above answers are "NO", then may proceed with Cephalosporin use.     Physical Exam  Vitals  Blood pressure 127/86, pulse 107, temperature 99.9 F (37.7 C), temperature source Oral, resp. rate 24, last menstrual period 06/18/2015, SpO2 96 %.   General:  Comfortable at rest.  Cardiovascular: S1-S2 normal. No murmurs. Pulse regular.  Respiratory: Good air entry bilaterally. No rhonchi or rales but wheezing.  Abdomen: Soft and nontender. Normal bowel sounds. No organomegaly.  Musculoskeletal: No pedal edema   Neurological: Intact  Data Review CBC  Recent Labs Lab 07/18/15 1047  WBC 6.1  HGB 12.4  HCT 39.5  PLT 238  MCV 81.3  MCH 25.5*  MCHC 31.4  RDW 14.6  LYMPHSABS 1.6  MONOABS 0.5  EOSABS 0.0  BASOSABS 0.0   ------------------------------------------------------------------------------------------------------------------  Chemistries   Recent Labs Lab 07/18/15 1047  NA 141  K 3.0*  CL 104  CO2 23  GLUCOSE 105*  BUN 10  CREATININE 0.92  CALCIUM 9.1  AST 16  ALT 12*  ALKPHOS 82  BILITOT 0.5   ------------------------------------------------------------------------------------------------------------------ CrCl cannot be calculated (Unknown ideal weight.). ------------------------------------------------------------------------------------------------------------------ No results for input(s): TSH, T4TOTAL, T3FREE, THYROIDAB in the last 72 hours.  Invalid input(s): FREET3   Coagulation profile No results for input(s): INR, PROTIME in the last 168 hours. ------------------------------------------------------------------------------------------------------------------- No results for input(s): DDIMER in the  last 72 hours. -------------------------------------------------------------------------------------------------------------------  Cardiac Enzymes No results for input(s): CKMB, TROPONINI, MYOGLOBIN in the last 168 hours.  Invalid input(s): CK ------------------------------------------------------------------------------------------------------------------ Invalid input(s): POCBNP   ---------------------------------------------------------------------------------------------------------------  Urinalysis    Component Value Date/Time   COLORURINE YELLOW 10/10/2013 0125   APPEARANCEUR CLOUDY* 10/10/2013 0125   LABSPEC 1.026 10/10/2013 0125   PHURINE 6.0 10/10/2013 0125   GLUCOSEU NEGATIVE 10/10/2013 0125   HGBUR LARGE* 10/10/2013 0125   BILIRUBINUR NEGATIVE 10/10/2013 0125   KETONESUR NEGATIVE 10/10/2013 0125   PROTEINUR 30* 10/10/2013 0125   UROBILINOGEN 1.0 10/10/2013 0125   NITRITE NEGATIVE 10/10/2013 0125   LEUKOCYTESUR LARGE* 10/10/2013 0125    ----------------------------------------------------------------------------------------------------------------  Imaging results  Dg Chest 2 View  07/18/2015  CLINICAL DATA:  Worsening cough, congestion, shortness of breath since Saturday, nonsmoker. Patient claims a history of asthma. EXAM: CHEST  2 VIEW COMPARISON:  Chest x-ray dated 04/29/2011. FINDINGS: Cardiomediastinal silhouette is normal in size and configuration. Lungs are clear. Lung volumes are normal. No evidence of pneumonia. No pleural effusion. No pneumothorax. Osseous and soft tissue structures about the chest are unremarkable. IMPRESSION: No active cardiopulmonary disease. Electronically Signed   By: Bary Richard M.D.   On: 07/18/2015 09:51        Arbor Cohen M.D on 07/18/2015 at 6:46 PM  Between 7am to 7pm - Pager - [phone]  After 7pm go to [email or web address] - password [email or web address]  And look for the night coverage person covering me after hours  [facility] Office  [phone]

## 2015-07-18 NOTE — ED Notes (Signed)
Pt with hx of asthma, c/o SOB, wheezing, productive cough with thick brown mucus, intermittent cramp in esophagus. Pt tested positive for strep throat and flu prescribed Zithromax, prednisone. Pt SOB, gets SOB talking. No wheezing. Lower lung fields decreased. Pt has been having urinary incontinence, states she will see urologist for this.

## 2015-07-18 NOTE — ED Provider Notes (Signed)
CSN: [id number]     Arrival date & time 07/18/15  0906 History   First MD Initiated Contact with Patient 07/18/15 [phone]     Chief Complaint  Patient presents with  . Shortness of Breath     (Consider location/radiation/quality/duration/timing/severity/associated sxs/prior Treatment) Patient is a 35 y.o. female presenting with shortness of breath. The history is provided by the patient (Patient complains of cough and shortness of breath. She has a history of asthma. She was put on steroids Saturday and was not getting any better).  Shortness of Breath Severity:  Moderate Onset quality:  Sudden Timing:  Constant Progression:  Worsening Chronicity:  Recurrent Context: activity   Associated symptoms: wheezing   Associated symptoms: no abdominal pain, no chest pain, no cough, no headaches and no rash     Past Medical History  Diagnosis Date  . Asthma   . Fibromyalgia   . Family history of anesthesia complication     mother stopped breathing and survived   . Migraine     tension induced   . Stress incontinence   . Anemia     with pregnancy   Past Surgical History  Procedure Laterality Date  . Tympanostomy tube placement    . Hernia repair    . Tonsillectomy    . Adenoidectomy    . Cystoscopy N/A 01/09/2014    Procedure: CYSTOSCOPY;  Surgeon: Martina Sinner, MD;  Location: [facility];  Service: Urology;  Laterality: N/A;  . Urethral diverticulum repair N/A 01/09/2014    Procedure: Excision of URETHRAL DIVERTICULUM;  Surgeon: Martina Sinner, MD;  Location: [facility];  Service: Urology;  Laterality: N/A;  . Heel spur surgery  nov. 1, 2016    tendon surgery also   Family History  Problem Relation Age of Onset  . Heart disease Mother   . Asthma Mother   . COPD Mother   . Stroke Father   . Seizures Father   . Heart disease Maternal Grandmother    Social History  Substance Use Topics  . Smoking status: Never Smoker   . Smokeless tobacco: Never Used  . Alcohol Use: No    OB History    Gravida Para Term Preterm AB TAB SAB Ectopic Multiple Living   Review of Systems  Constitutional: Negative for appetite change and fatigue.  HENT: Negative for congestion, ear discharge and sinus pressure.   Eyes: Negative for discharge.  Respiratory: Positive for shortness of breath and wheezing. Negative for cough.   Cardiovascular: Negative for chest pain.  Gastrointestinal: Negative for abdominal pain and diarrhea.  Genitourinary: Negative for frequency and hematuria.  Musculoskeletal: Negative for back pain.  Skin: Negative for rash.  Neurological: Negative for seizures and headaches.  Psychiatric/Behavioral: Negative for hallucinations.      Allergies  Amoxicillin; Latex; and Penicillins  Home Medications   Prior to Admission medications   Medication Sig Start Date End Date Taking? Authorizing Provider  acetaminophen-codeine (TYLENOL #3) 300-30 MG tablet Take 1 tablet by mouth at bedtime as needed for moderate pain.   Yes Historical Provider, MD  albuterol (PROVENTIL HFA;VENTOLIN HFA) 108 (90 BASE) MCG/ACT inhaler Inhale 2 puffs into the lungs every 4 (four) hours as needed for wheezing or shortness of breath. For shortness of breath 04/24/11  Yes Adlih Moreno-Coll, MD  Doxylamine-DM (SAFE TUSSIN PM) 3.125-7.5 MG/5ML LIQD Take 10 mLs by mouth 2 (two) times daily as needed (for cough).  Yes Historical Provider, MD  Fluticasone-Salmeterol (ADVAIR) 250-50 MCG/DOSE AEPB Inhale 1 puff into the lungs daily.   Yes Historical Provider, MD  gabapentin (NEURONTIN) 300 MG capsule Take 300 mg by mouth 3 (three) times daily.   Yes Historical Provider, MD  oseltamivir (TAMIFLU) 75 MG capsule Take 75 mg by mouth 2 (two) times daily. Started 03/01 for 5 days   Yes Historical Provider, MD  predniSONE (DELTASONE) 20 MG tablet Take 20 mg by mouth 2 (two) times daily with a meal. Started 02/28 for 5 days   Yes Historical Provider, MD  tiZANidine (ZANAFLEX) 4  MG tablet Take 4 mg by mouth 2 (two) times daily.   Yes Historical Provider, MD  pregabalin (LYRICA) 75 MG capsule Take 1 capsule (75 mg total) by mouth 2 (two) times daily. Patient not taking: Reported on 07/18/2015 09/11/13   Ramond Marrow, DO   BP 124/75 mmHg  Pulse 118  Temp(Src) 99.9 F (37.7 C) (Oral)  Resp 24  SpO2 100%  LMP 06/18/2015 Physical Exam  Constitutional: She is oriented to person, place, and time. She appears well-developed.  HENT:  Head: Normocephalic.  Eyes: Conjunctivae and EOM are normal. No scleral icterus.  Neck: Neck supple. No thyromegaly present.  Cardiovascular: Normal rate and regular rhythm.  Exam reveals no gallop and no friction rub.   No murmur heard. Pulmonary/Chest: No stridor. She has wheezes. She has no rales. She exhibits no tenderness.  Abdominal: She exhibits no distension. There is no tenderness. There is no rebound.  Musculoskeletal: Normal range of motion. She exhibits no edema.  Lymphadenopathy:    She has no cervical adenopathy.  Neurological: She is oriented to person, place, and time. She exhibits normal muscle tone. Coordination normal.  Skin: No rash noted. No erythema.  Psychiatric: She has a normal mood and affect. Her behavior is normal.    ED Course  Procedures (including critical care time) Labs Review Labs Reviewed  CBC WITH DIFFERENTIAL/PLATELET - Abnormal; Notable for the following:    MCH 25.5 (*)    All other components within normal limits  COMPREHENSIVE METABOLIC PANEL - Abnormal; Notable for the following:    Potassium 3.0 (*)    Glucose, Bld 105 (*)    ALT 12 (*)    All other components within normal limits    Imaging Review Dg Chest 2 View  07/18/2015  CLINICAL DATA:  Worsening cough, congestion, shortness of breath since Saturday, nonsmoker. Patient claims a history of asthma. EXAM: CHEST  2 VIEW COMPARISON:  Chest x-ray dated 04/29/2011. FINDINGS: Cardiomediastinal silhouette is normal in size and  configuration. Lungs are clear. Lung volumes are normal. No evidence of pneumonia. No pleural effusion. No pneumothorax. Osseous and soft tissue structures about the chest are unremarkable. IMPRESSION: No active cardiopulmonary disease. Electronically Signed   By: Bary Richard M.D.   On: 07/18/2015 09:51   I have personally reviewed and evaluated these images and lab results as part of my medical decision-making.   EKG Interpretation None      MDM   Final diagnoses:  Asthma exacerbation   Patient with asthma exacerbation. Improving some of the emergency department but not significant the better. She will be admitted to the hospital for asthma exacerbation    Bethann Berkshire, MD 07/18/15 [phone]

## 2015-07-18 NOTE — ED Notes (Signed)
Report given to 6E. 

## 2015-07-19 DIAGNOSIS — J101 Influenza due to other identified influenza virus with other respiratory manifestations: Principal | ICD-10-CM

## 2015-07-19 LAB — CBC
HEMATOCRIT: 40.3 % (ref 36.0–46.0)
HEMOGLOBIN: 12.6 g/dL (ref 12.0–15.0)
MCH: 25.7 pg — ABNORMAL LOW (ref 26.0–34.0)
MCHC: 31.3 g/dL (ref 30.0–36.0)
MCV: 82.1 fL (ref 78.0–100.0)
Platelets: 244 10*3/uL (ref 150–400)
RBC: 4.91 MIL/uL (ref 3.87–5.11)
RDW: 14.7 % (ref 11.5–15.5)
WBC: 4.2 10*3/uL (ref 4.0–10.5)

## 2015-07-19 LAB — COMPREHENSIVE METABOLIC PANEL
ALT: 11 U/L — ABNORMAL LOW (ref 14–54)
ANION GAP: 8 (ref 5–15)
AST: 13 U/L — ABNORMAL LOW (ref 15–41)
Albumin: 3.6 g/dL (ref 3.5–5.0)
Alkaline Phosphatase: 75 U/L (ref 38–126)
BILIRUBIN TOTAL: 0.5 mg/dL (ref 0.3–1.2)
BUN: 9 mg/dL (ref 6–20)
CALCIUM: 8.8 mg/dL — AB (ref 8.9–10.3)
CO2: 24 mmol/L (ref 22–32)
Chloride: 107 mmol/L (ref 101–111)
Creatinine, Ser: 0.7 mg/dL (ref 0.44–1.00)
GFR calc Af Amer: >60 mL/min (ref >60)
Glucose, Bld: 145 mg/dL — ABNORMAL HIGH (ref 65–99)
POTASSIUM: 4.6 mmol/L (ref 3.5–5.1)
Sodium: 139 mmol/L (ref 135–145)
TOTAL PROTEIN: 7.6 g/dL (ref 6.5–8.1)

## 2015-07-19 LAB — URINALYSIS, ROUTINE W REFLEX MICROSCOPIC
BILIRUBIN URINE: NEGATIVE
Glucose, UA: NEGATIVE mg/dL
Hgb urine dipstick: NEGATIVE
Ketones, ur: NEGATIVE mg/dL
Leukocytes, UA: NEGATIVE
NITRITE: NEGATIVE
PROTEIN: NEGATIVE mg/dL
SPECIFIC GRAVITY, URINE: 1.016 (ref 1.005–1.030)
pH: 6.5 (ref 5.0–8.0)

## 2015-07-19 LAB — TSH: TSH: 0.758 u[IU]/mL (ref 0.350–4.500)

## 2015-07-19 MED ORDER — LIP MEDEX EX OINT
TOPICAL_OINTMENT | CUTANEOUS | Status: AC
  Administered 2015-07-19: 15:00:00
  Filled 2015-07-19: qty 7

## 2015-07-19 NOTE — Progress Notes (Signed)
[facility] PROGRESS NOTE  Gustavo LahLatanya J Virtue ZOX:[id number] DOB: [birth year] DOA: 07/18/2015 PCP: Oliver PilaICHARDSON,KATHY W, MD  Summary 07/18/15: [name] is a pleasant 35 y.o. female with morbid obesity, moderate persistent BA , who comes in with worsening sob associated with subjective fever, generalized Muscle aches and cough productive of brownish colored sputum for the past 1 week, not responding to outpatient interventions including prednisone,, she has sick family members including her son/husband hence concern for the flu. She therefore has acute exacerbation of BA with possible CAP. CXR is unrevealing, labs show wbc 6.100, potassium 3. Clinical exam is significant for wheezing, low grade temp of 99.48F and tachycardia. She has been given systemic steroids/bronchodilators, Levaquin in the ED and referred for admission. Of note is that she started Tamiflu course yesterday(prescribed at the urgent care center). She will be admitted to med surg for further management including UA/UC/Urine legionella antigen/sputum culture, Doxycycline/Tamiflu, systemic steroids/bronchodilators/O2 supplementation as needed. Patient is full code. 07/19/15: [facility] Her cough has dried up. She is still easily dyspneic with minimal exertion. Will continue doxycycline/Tamiflu/IV fluids/systemic steroids/bronchodilators/oxygen supplementation as needed. She will likely DC home this weekend depending on clinical improvement.   Plan Asthma exacerbation, Influenza type A  Sputum culture /Urine legionella antigen pending  Day 2 Tamiflu/Doxycycline  Solumedrol/Albuterol/O2 as needed/Singulair Acute hypokalemia  Replenish electrolytes as needed. Morbid obesity with body mass index of 45.0-49.9 in adult [facility](HCC)  Life style changes.  DVT/GI Prophylaxis  Lovenox  Protonix Family Communication: Admission, patients condition and plan of care including tests being  ordered have been discussed with the patient and husband who indicate understanding and agree with the plan and Code Status. Code Status   Full Code Likely DC  ?Home this weekend  Consultants:  None  Procedures:  None  Antibiotics:  Doxycycline 07/18/15>>  Tamiflu>>  HPI/Subjective: Feels better but is still easily dyspneic with minimal exertion. Cough has dried up.  Objective: Filed Vitals:   07/19/15 0255 07/19/15 0619  BP: 119/70 125/80  Pulse: 73 90  Temp: 98 F (36.7 C) 98.1 F (36.7 C)  Resp: 20 19    Intake/Output Summary (Last 24 hours) at 07/19/15 1044 Last data filed at 07/19/15 0639  Gross per 24 hour  Intake    480 ml  Output   1100 ml  Net   -620 ml   Filed Weights   07/18/15 2001  Weight: 124.8 kg (275 lb 2.2 oz)    Exam:   General:  Comfortable at rest.  Cardiovascular: S1-S2 normal. No murmurs. Pulse regular.  Respiratory: Good air entry bilaterally. No rhonchi or rales.  Abdomen: Soft and nontender. Normal bowel sounds. No organomegaly.  Musculoskeletal: No pedal edema   Neurological: Intact  Data Reviewed: Basic Metabolic Panel:  Recent Labs Lab 07/18/15 1047 07/19/15 0419  NA 141 139  K 3.0* 4.6  CL 104 107  CO2 23 24  GLUCOSE 105* 145*  BUN 10 9  CREATININE 0.92 0.70  CALCIUM 9.1 8.8*   Liver Function Tests:  Recent Labs Lab 07/18/15 1047 07/19/15 0419  AST 16 13*  ALT 12* 11*  ALKPHOS 82 75  BILITOT 0.5 0.5  PROT 7.8 7.6  ALBUMIN 4.0 3.6   No results for input(s): LIPASE, AMYLASE in the last 168 hours. No results for input(s): AMMONIA in the last 168 hours. CBC:  Recent Labs Lab 07/18/15 1047 07/19/15 0419  WBC 6.1 4.2  NEUTROABS 4.0  --   HGB  12.4 12.6  HCT 39.5 40.3  MCV 81.3 82.1  PLT 238 244   Cardiac Enzymes: No results for input(s): CKTOTAL, CKMB, CKMBINDEX, TROPONINI in the last 168 hours. BNP (last 3 results) No results for input(s): BNP in the last 8760 hours.  ProBNP (last 3  results) No results for input(s): PROBNP in the last 8760 hours.  CBG: No results for input(s): GLUCAP in the last 168 hours.  No results found for this or any previous visit (from the past 240 hour(s)).   Studies: Dg Chest 2 View  07/18/2015  CLINICAL DATA:  Worsening cough, congestion, shortness of breath since Saturday, nonsmoker. Patient claims a history of asthma. EXAM: CHEST  2 VIEW COMPARISON:  Chest x-ray dated 04/29/2011. FINDINGS: Cardiomediastinal silhouette is normal in size and configuration. Lungs are clear. Lung volumes are normal. No evidence of pneumonia. No pleural effusion. No pneumothorax. Osseous and soft tissue structures about the chest are unremarkable. IMPRESSION: No active cardiopulmonary disease. Electronically Signed   By: Bary Richard M.D.   On: 07/18/2015 09:51    Scheduled Meds: . doxycycline (VIBRAMYCIN) IV  100 mg Intravenous Q12H  . enoxaparin (LOVENOX) injection  40 mg Subcutaneous Q24H  . gabapentin  300 mg Oral TID  . methylPREDNISolone (SOLU-MEDROL) injection  80 mg Intravenous Q6H  . montelukast  10 mg Oral QHS  . oseltamivir  75 mg Oral BID  . pantoprazole  40 mg Oral QHS  . tiZANidine  4 mg Oral BID   Continuous Infusions: . sodium chloride 50 mL/hr at 07/18/15 2238     Time spent: 25 minutes    Larine Fielding  [facility] Pager [phone]. If 7PM-7AM, please contact night-coverage at [email or web address], password [facility] 07/19/2015, 10:44 AM  LOS: 1 day

## 2015-07-20 LAB — BASIC METABOLIC PANEL
Anion gap: 9 (ref 5–15)
BUN: 12 mg/dL (ref 6–20)
CHLORIDE: 103 mmol/L (ref 101–111)
CO2: 23 mmol/L (ref 22–32)
CREATININE: 0.74 mg/dL (ref 0.44–1.00)
Calcium: 8.9 mg/dL (ref 8.9–10.3)
Glucose, Bld: 144 mg/dL — ABNORMAL HIGH (ref 65–99)
Potassium: 4.7 mmol/L (ref 3.5–5.1)
SODIUM: 135 mmol/L (ref 135–145)

## 2015-07-20 LAB — EXPECTORATED SPUTUM ASSESSMENT W GRAM STAIN, RFLX TO RESP C

## 2015-07-20 LAB — CBC
HCT: 41 % (ref 36.0–46.0)
HEMOGLOBIN: 12.8 g/dL (ref 12.0–15.0)
MCH: 25.6 pg — AB (ref 26.0–34.0)
MCHC: 31.2 g/dL (ref 30.0–36.0)
MCV: 82 fL (ref 78.0–100.0)
PLATELETS: 274 10*3/uL (ref 150–400)
RBC: 5 MIL/uL (ref 3.87–5.11)
RDW: 14.8 % (ref 11.5–15.5)
WBC: 11.4 10*3/uL — ABNORMAL HIGH (ref 4.0–10.5)

## 2015-07-20 LAB — EXPECTORATED SPUTUM ASSESSMENT W REFEX TO RESP CULTURE

## 2015-07-20 LAB — URINE CULTURE

## 2015-07-20 MED ORDER — LEVOFLOXACIN 750 MG PO TABS
750.0000 mg | ORAL_TABLET | Freq: Every day | ORAL | Status: DC
  Administered 2015-07-20 – 2015-07-21 (×2): 750 mg via ORAL
  Filled 2015-07-20 (×2): qty 1

## 2015-07-20 MED ORDER — PREDNISONE 20 MG PO TABS
40.0000 mg | ORAL_TABLET | Freq: Every day | ORAL | Status: DC
  Administered 2015-07-20 – 2015-07-21 (×2): 40 mg via ORAL
  Filled 2015-07-20 (×3): qty 2

## 2015-07-20 MED ORDER — ENOXAPARIN SODIUM 60 MG/0.6ML ~~LOC~~ SOLN
60.0000 mg | SUBCUTANEOUS | Status: DC
  Administered 2015-07-20 – 2015-07-21 (×2): 60 mg via SUBCUTANEOUS
  Filled 2015-07-20 (×2): qty 0.6

## 2015-07-20 NOTE — Progress Notes (Signed)
Patient c/o pain around PIV insertion site.  No erythema, swelling or infiltration noted.  IV team consulted for ne PIV start.

## 2015-07-20 NOTE — Progress Notes (Signed)
[facility] PROGRESS NOTE  [name] [id number] DOB: January 02, 1981 DOA: 07/18/2015 PCP: [name]  Summary 07/18/15: [name] is a pleasant 35 y.o. female with morbid obesity, moderate persistent BA , who comes in with worsening sob associated with subjective fever, generalized Muscle aches and cough productive of brownish colored sputum for the past 1 week, not responding to outpatient interventions including prednisone,, she has sick family members including her son/husband hence concern for the flu. She therefore has acute exacerbation of BA with possible CAP. CXR is unrevealing, labs show wbc 6.100, potassium 3. Clinical exam is significant for wheezing, low grade temp of 99.3F and tachycardia. She has been given systemic steroids/bronchodilators, Levaquin in the ED and referred for admission. Of note is that she started Tamiflu course yesterday(prescribed at the urgent care center). She will be admitted to med surg for further management including UA/UC/Urine legionella antigen/sputum culture, Doxycycline/Tamiflu, systemic steroids/bronchodilators/O2 supplementation as needed. Patient is full code. 07/19/15: [facility] Her cough has dried up. She is still easily dyspneic with minimal exertion. Will continue doxycycline/Tamiflu/IV fluids/systemic steroids/bronchodilators/oxygen supplementation as needed. She will likely DC home this weekend depending on clinical improvement. 07/20/15: Feeling better, but easily dyspneic with minimal exertion. Will transition antibiotics and steroids to oral, hopefully DC tomorrow if she continues to do well.  Plan Asthma exacerbation, Influenza type A  Follow Sputum culture /Urine legionella antigen pending  Day 3 Tamiflu  Discontinue Doxycycline  Levaquin oral  Discontinue Solumedrol, start prednisone  Continue Albuterol/O2 as needed/Singulair Acute hypokalemia  Replenish  electrolytes as needed. Morbid obesity with body mass index of 45.0-49.9 in adult [facility](HCC)  Life style changes.  DVT/GI Prophylaxis  Lovenox  Protonix Family Communication: Admission, patients condition and plan of care including tests being ordered have been discussed with the patient and husband who indicate understanding and agree with the plan and Code Status. Code Status   Full Code Likely DC  ?Home tomorrow  Consultants:  None  Procedures:  None  Antibiotics:  Doxycycline 07/18/15>>07/20/15  Tamiflu 07/18/15>>   Levaquin 07/20/15>>  HPI/Subjective:  feels better, some dyspnea with exertion.  Objective: Filed Vitals:   07/19/15 1440 07/20/15 0427  BP: 117/67 150/88  Pulse: 66 71  Temp: 98.2 F (36.8 C) 97.8 F (36.6 C)  Resp: 16 17    Intake/Output Summary (Last 24 hours) at 07/20/15 1030 Last data filed at 07/20/15 0900  Gross per 24 hour  Intake   2210 ml  Output    400 ml  Net   1810 ml   Filed Weights   07/18/15 2001  Weight: 124.8 kg (275 lb 2.2 oz)    Exam:   General:  Comfortable at rest.  Cardiovascular: S1-S2 normal. No murmurs. Pulse regular.  Respiratory: Good air entry bilaterally. No rhonchi or rales.  Abdomen: Soft and nontender. Normal bowel sounds. No organomegaly.  Musculoskeletal: No pedal edema   Neurological: Intact  Data Reviewed: Basic Metabolic Panel:  Recent Labs Lab 07/18/15 1047 07/19/15 0419 07/20/15 0502  NA 141 139 135  K 3.0* 4.6 4.7  CL 104 107 103  CO2 23 24 23   GLUCOSE 105* 145* 144*  BUN 10 9 12   CREATININE 0.92 0.70 0.74  CALCIUM 9.1 8.8* 8.9   Liver Function Tests:  Recent Labs Lab 07/18/15 1047 07/19/15 0419  AST 16 13*  ALT 12* 11*  ALKPHOS 82 75  BILITOT 0.5 0.5  PROT 7.8 7.6  ALBUMIN 4.0 3.6  No results for input(s): LIPASE, AMYLASE in the last 168 hours. No results for input(s): AMMONIA in the last 168 hours. CBC:  Recent Labs Lab 07/18/15 1047 07/19/15 0419  07/20/15 0502  WBC 6.1 4.2 11.4*  NEUTROABS 4.0  --   --   HGB 12.4 12.6 12.8  HCT 39.5 40.3 41.0  MCV 81.3 82.1 82.0  PLT 238 244 274   Cardiac Enzymes: No results for input(s): CKTOTAL, CKMB, CKMBINDEX, TROPONINI in the last 168 hours. BNP (last 3 results) No results for input(s): BNP in the last 8760 hours.  ProBNP (last 3 results) No results for input(s): PROBNP in the last 8760 hours.  CBG: No results for input(s): GLUCAP in the last 168 hours.  No results found for this or any previous visit (from the past 240 hour(s)).   Studies: No results found.  Scheduled Meds: . enoxaparin (LOVENOX) injection  60 mg Subcutaneous Q24H  . gabapentin  300 mg Oral TID  . levofloxacin  750 mg Oral Daily  . montelukast  10 mg Oral QHS  . oseltamivir  75 mg Oral BID  . pantoprazole  40 mg Oral QHS  . predniSONE  40 mg Oral Q breakfast  . tiZANidine  4 mg Oral BID   Continuous Infusions:    Time spent: 25 minutes    [name]  [facility] Pager [phone]. If 7PM-7AM, please contact night-coverage at [email or web address], password [facility] 07/20/2015, 10:30 AM  LOS: 2 days

## 2015-07-21 LAB — BASIC METABOLIC PANEL
Anion gap: 7 (ref 5–15)
BUN: 12 mg/dL (ref 6–20)
CHLORIDE: 102 mmol/L (ref 101–111)
CO2: 29 mmol/L (ref 22–32)
CREATININE: 0.83 mg/dL (ref 0.44–1.00)
Calcium: 8.5 mg/dL — ABNORMAL LOW (ref 8.9–10.3)
GFR calc Af Amer: >60 mL/min (ref >60)
GFR calc non Af Amer: >60 mL/min (ref >60)
Glucose, Bld: 140 mg/dL — ABNORMAL HIGH (ref 65–99)
Potassium: 4.3 mmol/L (ref 3.5–5.1)
SODIUM: 138 mmol/L (ref 135–145)

## 2015-07-21 LAB — CBC
HEMATOCRIT: 39 % (ref 36.0–46.0)
HEMOGLOBIN: 12.5 g/dL (ref 12.0–15.0)
MCH: 26.1 pg (ref 26.0–34.0)
MCHC: 32.1 g/dL (ref 30.0–36.0)
MCV: 81.4 fL (ref 78.0–100.0)
Platelets: 257 10*3/uL (ref 150–400)
RBC: 4.79 MIL/uL (ref 3.87–5.11)
RDW: 14.5 % (ref 11.5–15.5)
WBC: 13.6 10*3/uL — ABNORMAL HIGH (ref 4.0–10.5)

## 2015-07-21 MED ORDER — ACETAMINOPHEN-CODEINE #3 300-30 MG PO TABS: 1.0000 | ORAL_TABLET | Freq: Three times a day (TID) | ORAL | Status: DC | PRN

## 2015-07-21 MED ORDER — SENNOSIDES-DOCUSATE SODIUM 8.6-50 MG PO TABS: 1.0000 | ORAL_TABLET | Freq: Every evening | ORAL | Status: DC | PRN

## 2015-07-21 MED ORDER — GUAIFENESIN-CODEINE 100-10 MG/5ML PO SOLN: 10.0000 mL | Freq: Four times a day (QID) | ORAL | Status: DC | PRN

## 2015-07-21 MED ORDER — LEVOFLOXACIN 750 MG PO TABS: 750.0000 mg | ORAL_TABLET | Freq: Every day | ORAL | Status: DC

## 2015-07-21 MED ORDER — MONTELUKAST SODIUM 10 MG PO TABS: 10.0000 mg | ORAL_TABLET | Freq: Every day | ORAL | Status: DC

## 2015-07-21 NOTE — Discharge Summary (Signed)
[name], is a 35 y.o. female  DOB 04-Jul-1980  MRN [id number].  Admission date:  07/18/2015  Admitting Physician  Conley CanalSimbiso Kiearra Oyervides, MD  Discharge Date:  07/21/2015   Primary MD  Oliver PilaICHARDSON,KATHY W, MD  Recommendations for primary care physician for things to follow:  Streamline Psychoactive meds as possible.  Admission Diagnosis   Asthma exacerbation [J45.901]   Discharge Diagnosis  Asthma exacerbation [J45.901]   Active Problems:   Influenza A   Asthma exacerbation   Morbid obesity with body mass index of 45.0-49.9 in adult [facility](HCC)   Acute hypokalemia      Summary Melinda HumbleLatanya [name] is a pleasant 35 year old female with morbid obesity BMI 45-49.9/moderate persistent bronchial asthma/fibromyalgia who came in with cough/shortness of breath/subjective fever and generalized muscle aches resulting from Influenza type A which caused acute exacerbation of bronchial asthma with possibility of superimposed bacterial infection. She was not responding to outpatient treatment therefore admitted for intravenous antibiotics/systemic steroids/bronchodilators/oxygen supplementation as needed. She feels better and will therefore be discharged home to follow with her PCP in the next 1-2 weeks. She will complete 4 more days of Levaquin and 2 more days of Tamiflu. Please refer to the daily progress notes below for details of patient's hospital stay;  Hospital Course  07/18/15: [name] is a pleasant 35 y.o. female with morbid obesity, moderate persistent BA , who comes in with worsening sob associated with subjective fever, generalized Muscle aches and cough productive of brownish colored sputum for the past 1 week, not responding to outpatient interventions including prednisone,, she has sick family members including her  son/husband hence concern for the flu. She therefore has acute exacerbation of BA with possible CAP. CXR is unrevealing, labs show wbc 6.100, potassium 3. Clinical exam is significant for wheezing, low grade temp of 99.64F and tachycardia. She has been given systemic steroids/bronchodilators, Levaquin in the ED and referred for admission. Of note is that she started Tamiflu course yesterday(prescribed at the urgent care center). She will be admitted to med surg for further management including UA/UC/Urine legionella antigen/sputum culture, Doxycycline/Tamiflu, systemic steroids/bronchodilators/O2 supplementation as needed. Patient is full code. 07/19/15: [facility] Her cough has dried up. She is still easily dyspneic with minimal exertion. Will continue doxycycline/Tamiflu/IV fluids/systemic steroids/bronchodilators/oxygen supplementation as needed. She will likely DC home this weekend depending on clinical improvement. 07/20/15: Feeling better, but easily dyspneic with minimal exertion. Will transition antibiotics and steroids to oral, hopefully DC tomorrow if she continues to do well. 07/21/15: Complains of generalized muscle aches especially legs. She is otherwise better. We'll therefore discharge today to follow with her PCP. She should complete 4 more days of Levaquin, and course of Tamiflu.  Discharge Condition Stable.  Consults obtained  None  Follow UP  PCP   Discharge Instructions  and  Discharge Medications  Discharge Instructions    Diet - low sodium heart healthy    Complete by:  As directed      Increase activity slowly  Complete by:  As directed             Medication List    STOP taking these medications        pregabalin 75 MG capsule  Commonly known as:  LYRICA      TAKE these medications        acetaminophen-codeine 300-30 MG tablet  Commonly known as:  TYLENOL #3  Take 1 tablet by mouth every 8 (eight) hours as needed for  moderate pain.     albuterol 108 (90 Base) MCG/ACT inhaler  Commonly known as:  PROVENTIL HFA;VENTOLIN HFA  Inhale 2 puffs into the lungs every 4 (four) hours as needed for wheezing or shortness of breath. For shortness of breath     Fluticasone-Salmeterol 250-50 MCG/DOSE Aepb  Commonly known as:  ADVAIR  Inhale 1 puff into the lungs daily.     gabapentin 300 MG capsule  Commonly known as:  NEURONTIN  Take 300 mg by mouth 3 (three) times daily.     guaiFENesin-codeine 100-10 MG/5ML syrup  Take 10 mLs by mouth every 6 (six) hours as needed for cough.     levofloxacin 750 MG tablet  Commonly known as:  LEVAQUIN  Take 1 tablet (750 mg total) by mouth daily.     montelukast 10 MG tablet  Commonly known as:  SINGULAIR  Take 1 tablet (10 mg total) by mouth at bedtime.     oseltamivir 75 MG capsule  Commonly known as:  TAMIFLU  Take 75 mg by mouth 2 (two) times daily. Started 03/01 for 5 days     predniSONE 20 MG tablet  Commonly known as:  DELTASONE  Take 20 mg by mouth 2 (two) times daily with a meal. Started 02/28 for 5 days     SAFE TUSSIN PM 3.125-7.5 MG/5ML Liqd  Generic drug:  Doxylamine-DM  Take 10 mLs by mouth 2 (two) times daily as needed (for cough).     senna-docusate 8.6-50 MG tablet  Commonly known as:  Senokot-S  Take 1 tablet by mouth at bedtime as needed for mild constipation.     tiZANidine 4 MG tablet  Commonly known as:  ZANAFLEX  Take 4 mg by mouth 2 (two) times daily.        Diet and Activity recommendation: See Discharge Instructions above  Major procedures and Radiology Reports - PLEASE review detailed and final reports for all details, in brief -    Dg Chest 2 View  07/18/2015  CLINICAL DATA:  Worsening cough, congestion, shortness of breath since Saturday, nonsmoker. Patient claims a history of asthma. EXAM: CHEST  2 VIEW COMPARISON:  Chest x-ray dated 04/29/2011. FINDINGS: Cardiomediastinal silhouette is normal in size and configuration. Lungs  are clear. Lung volumes are normal. No evidence of pneumonia. No pleural effusion. No pneumothorax. Osseous and soft tissue structures about the chest are unremarkable. IMPRESSION: No active cardiopulmonary disease. Electronically Signed   By: Bary Richard M.D.   On: 07/18/2015 09:51    Micro Results   Recent Results (from the past 240 hour(s))  Culture, Urine     Status: None   Collection Time: 07/19/15  2:10 AM  Result Value Ref Range Status   Specimen Description URINE, CLEAN CATCH  Final   Special Requests NONE  Final   Culture   Final    MULTIPLE SPECIES PRESENT, SUGGEST RECOLLECTION Performed at [facility]    Report Status 07/20/2015 FINAL  Final  Culture, expectorated sputum-assessment  Status: None   Collection Time: 07/20/15  8:15 PM  Result Value Ref Range Status   Specimen Description SPUTUM  Final   Special Requests NONE  Final   Sputum evaluation   Final    THIS SPECIMEN IS ACCEPTABLE. RESPIRATORY CULTURE REPORT TO FOLLOW.   Report Status 07/20/2015 FINAL  Final       Today   Subjective:   Neria Armor denies any complaints..   Objective:   Blood pressure 114/88, pulse 60, temperature 97.6 F (36.4 C), temperature source Oral, resp. rate 16, height  (1.651 m), weight 124.8 kg (275 lb 2.2 oz), last menstrual period 06/18/2015, SpO2 100 %.   Intake/Output Summary (Last 24 hours) at 07/21/15 0938 Last data filed at 07/21/15 0701  Gross per 24 hour  Intake 1736.67 ml  Output    400 ml  Net 1336.67 ml    Exam No wheezing  Data Review   CBC w Diff: Lab Results  Component Value Date   WBC 13.6* 07/21/2015   HGB 12.5 07/21/2015   HCT 39.0 07/21/2015   PLT 257 07/21/2015   LYMPHOPCT 26 07/18/2015   MONOPCT 9 07/18/2015   EOSPCT 0 07/18/2015   BASOPCT 0 07/18/2015    CMP: Lab Results  Component Value Date   NA 138 07/21/2015   K 4.3 07/21/2015   CL 102 07/21/2015   CO2 29 07/21/2015   BUN 12 07/21/2015   CREATININE 0.83  07/21/2015   PROT 7.6 07/19/2015   ALBUMIN 3.6 07/19/2015   BILITOT 0.5 07/19/2015   ALKPHOS 75 07/19/2015   AST 13* 07/19/2015   ALT 11* 07/19/2015  .   Total Time in preparing paper work, data evaluation and todays exam - 20 minutes  Kaila Devries M.D on 07/21/2015 at 9:38 AM  [facility] Office  [phone]

## 2015-07-21 NOTE — Progress Notes (Signed)
Discussed with patient and significant other discharge instructions, both verbalized agreement and understanding.  Patient's IV was discontinued with no complications.  Patient to go down in wheelchair with all belongings to go home in private vehicle. 

## 2015-07-22 LAB — LEGIONELLA ANTIGEN, URINE

## 2015-07-23 LAB — CULTURE, RESPIRATORY W GRAM STAIN: Culture: NORMAL

## 2015-07-23 LAB — CULTURE, RESPIRATORY

## 2015-09-11 ENCOUNTER — Encounter (HOSPITAL_COMMUNITY): Payer: Self-pay | Admitting: Emergency Medicine

## 2015-09-11 ENCOUNTER — Emergency Department (HOSPITAL_COMMUNITY): Payer: Medicaid Other

## 2015-09-11 ENCOUNTER — Emergency Department (HOSPITAL_COMMUNITY)
Admission: EM | Admit: 2015-09-11 | Discharge: 2015-09-11 | Disposition: A | Discharge status: Home / Self Care | Payer: Medicaid Other

## 2015-09-11 ENCOUNTER — Emergency Department (HOSPITAL_COMMUNITY)
Admission: EM | Admit: 2015-09-11 | Discharge: 2015-09-12 | Disposition: A | Discharge status: Home / Self Care | Payer: Medicaid Other | Attending: Emergency Medicine | Admitting: Emergency Medicine

## 2015-09-11 DIAGNOSIS — Z79899 Other long term (current) drug therapy: Secondary | ICD-10-CM | POA: Diagnosis not present

## 2015-09-11 DIAGNOSIS — Z880 Allergy status to penicillin: Secondary | ICD-10-CM | POA: Diagnosis not present

## 2015-09-11 DIAGNOSIS — G43909 Migraine, unspecified, not intractable, without status migrainosus: Secondary | ICD-10-CM | POA: Diagnosis not present

## 2015-09-11 DIAGNOSIS — J45901 Unspecified asthma with (acute) exacerbation: Secondary | ICD-10-CM | POA: Diagnosis not present

## 2015-09-11 DIAGNOSIS — Z87448 Personal history of other diseases of urinary system: Secondary | ICD-10-CM | POA: Diagnosis not present

## 2015-09-11 DIAGNOSIS — M797 Fibromyalgia: Secondary | ICD-10-CM | POA: Insufficient documentation

## 2015-09-11 DIAGNOSIS — R0602 Shortness of breath: Secondary | ICD-10-CM | POA: Diagnosis present

## 2015-09-11 DIAGNOSIS — Z91040 Latex allergy status: Secondary | ICD-10-CM | POA: Insufficient documentation

## 2015-09-11 DIAGNOSIS — Z7951 Long term (current) use of inhaled steroids: Secondary | ICD-10-CM | POA: Insufficient documentation

## 2015-09-11 MED ORDER — ALBUTEROL SULFATE (2.5 MG/3ML) 0.083% IN NEBU
5.0000 mg | INHALATION_SOLUTION | Freq: Once | RESPIRATORY_TRACT | Status: AC
  Administered 2015-09-11: 5 mg via RESPIRATORY_TRACT
  Filled 2015-09-11: qty 6

## 2015-09-11 NOTE — ED Notes (Signed)
PA at bedside.

## 2015-09-11 NOTE — ED Notes (Signed)
Informed by registration that patient has left ED.

## 2015-09-11 NOTE — ED Provider Notes (Signed)
CSN: [id number]     Arrival date & time 09/11/15  2049 History   First MD Initiated Contact with Patient 09/11/15 2311     Chief Complaint  Patient presents with  . Shortness of Breath   HPI    35 year old female presents today with asthma attack. Patient reports symptoms started last night and continued through this morning. She reports URI started on Friday approximately 6 days ago. She reports sinus congestion and rhinorrhea. Patient notes significant past medical history of asthma exacerbations requiring hospital admission. Patient notes that she's had wheezing at home that was not improved with when necessary inhaler and nebulized albuterol treatment. Patient denies any fever, chest pain, shortness of breath other than the acute asthma exacerbation. Patient is not a smoker, she has no lotion swelling or edema, estrogen use, prolonged immobilization, recent surgery, or any other DVT PE risk factors.   Past Medical History  Diagnosis Date  . Asthma   . Fibromyalgia   . Family history of anesthesia complication     mother stopped breathing and survived   . Migraine     tension induced   . Stress incontinence   . Anemia     with pregnancy   Past Surgical History  Procedure Laterality Date  . Tympanostomy tube placement    . Hernia repair    . Tonsillectomy    . Adenoidectomy    . Cystoscopy N/A 01/09/2014    Procedure: CYSTOSCOPY;  Surgeon: Martina Sinner, MD;  Location: [facility];  Service: Urology;  Laterality: N/A;  . Urethral diverticulum repair N/A 01/09/2014    Procedure: Excision of URETHRAL DIVERTICULUM;  Surgeon: Martina Sinner, MD;  Location: [facility];  Service: Urology;  Laterality: N/A;  . Heel spur surgery  nov. 1, 2016    tendon surgery also   Family History  Problem Relation Age of Onset  . Heart disease Mother   . Asthma Mother   . COPD Mother   . Stroke Father   . Seizures Father   . Heart disease Maternal Grandmother    Social History  Substance Use  Topics  . Smoking status: Never Smoker   . Smokeless tobacco: Never Used  . Alcohol Use: No   OB History    Gravida Para Term Preterm AB TAB SAB Ectopic Multiple Living   Review of Systems  All other systems reviewed and are negative.   Allergies  Amoxicillin; Latex; and Penicillins  Home Medications   Prior to Admission medications   Medication Sig Start Date End Date Taking? Authorizing Provider  Fluticasone-Salmeterol (ADVAIR) 250-50 MCG/DOSE AEPB Inhale 1 puff into the lungs daily.   Yes Historical Provider, MD  gabapentin (NEURONTIN) 300 MG capsule Take 300 mg by mouth 3 (three) times daily.   Yes Historical Provider, MD  montelukast (SINGULAIR) 10 MG tablet Take 1 tablet (10 mg total) by mouth at bedtime. 07/21/15  Yes Simbiso Ranga, MD  tiZANidine (ZANAFLEX) 4 MG tablet Take 4 mg by mouth at bedtime.    Yes Historical Provider, MD  acetaminophen-codeine (TYLENOL #3) 300-30 MG tablet Take 1 tablet by mouth every 8 (eight) hours as needed for moderate pain. Patient not taking: Reported on 09/11/2015 07/21/15   Conley Canal, MD  albuterol (PROVENTIL HFA;VENTOLIN HFA) 108 (90 Base) MCG/ACT inhaler Inhale 1-2 puffs into the lungs every 4 (four) hours as needed for wheezing or shortness of breath. For shortness of  breath 09/12/15   Eyvonne MechanicJeffrey Macyn Shropshire, PA-C  albuterol (PROVENTIL) (5 MG/ML) 0.5% nebulizer solution Take 0.5 mLs (2.5 mg total) by nebulization every 6 (six) hours as needed for wheezing or shortness of breath. 09/12/15   Eyvonne MechanicJeffrey Jasmaine Rochel, PA-C  guaiFENesin-codeine 100-10 MG/5ML syrup Take 10 mLs by mouth every 6 (six) hours as needed for cough. Patient not taking: Reported on 09/11/2015 07/21/15   Conley CanalSimbiso Ranga, MD  levofloxacin (LEVAQUIN) 750 MG tablet Take 1 tablet (750 mg total) by mouth daily. Patient not taking: Reported on 09/11/2015 07/21/15   Conley CanalSimbiso Ranga, MD  predniSONE (DELTASONE) 20 MG tablet Take 1 tablet (20 mg total) by mouth 2 (two) times daily with a  meal. Started 02/28 for 5 days 09/12/15   Eyvonne MechanicJeffrey Ifeanyi Mickelson, PA-C  senna-docusate (SENOKOT-S) 8.6-50 MG tablet Take 1 tablet by mouth at bedtime as needed for mild constipation. Patient not taking: Reported on 09/11/2015 07/21/15   Simbiso Ranga, MD   BP 101/70 mmHg  Pulse 84  Temp(Src) 98.4 F (36.9 C) (Oral)  Resp 24  Ht 5\' 1"  (1.549 m)  Wt 122.925 kg  BMI 51.23 kg/m2  SpO2 100%  LMP 09/06/2015 (Exact Date)   Physical Exam  Constitutional: She is oriented to person, place, and time. She appears well-developed and well-nourished.  HENT:  Head: Normocephalic and atraumatic.  Eyes: Conjunctivae are normal. Pupils are equal, round, and reactive to light. Right eye exhibits no discharge. Left eye exhibits no discharge. No scleral icterus.  Neck: Normal range of motion. No JVD present. No tracheal deviation present.  Cardiovascular: Normal rate, regular rhythm and intact distal pulses.  Exam reveals no gallop and no friction rub.   No murmur heard. Pulmonary/Chest: Effort normal and breath sounds normal. No stridor. No respiratory distress. She has no wheezes. She has no rales. She exhibits no tenderness.  Musculoskeletal: Normal range of motion. She exhibits no edema or tenderness.  Neurological: She is alert and oriented to person, place, and time. Coordination normal.  Skin: Skin is warm and dry. No rash noted. No erythema.  Psychiatric: She has a normal mood and affect. Her behavior is normal. Judgment and thought content normal.  Nursing note and vitals reviewed.   ED Course  Procedures (including critical care time) Labs Review Labs Reviewed - No data to display  Imaging Review Dg Chest 2 View  09/11/2015  CLINICAL DATA:  Patient with asthma.  Worsening shortness of breath. EXAM: CHEST  2 VIEW COMPARISON:  Chest radiograph 07/18/2015. FINDINGS: Stable cardiac and mediastinal contours. No consolidative pulmonary opacities. No pleural effusion or pneumothorax. Regional skeleton is  unremarkable. IMPRESSION: No active cardiopulmonary disease. Electronically Signed   By: Annia Beltrew  Davis M.D.   On: 09/11/2015 22:24   I have personally reviewed and evaluated these images and lab results as part of my medical decision-making.   EKG Interpretation   Date/Time:  Wednesday September 11 2015 21:28:31 EDT Ventricular Rate:  96 PR Interval:  126 QRS Duration: 74 QT Interval:  442 QTC Calculation: 559 R Axis:   -6 Text Interpretation:  Sinus rhythm Low voltage, precordial leads  Borderline T abnormalities, diffuse leads Prolonged QT interval since last  tracing no significant change Confirmed by BELFI  MD, MELANIE ([id number]) on  09/11/2015 11:26:00 PM      MDM   Final diagnoses:  Asthma exacerbation    Labs:    Imaging: DG chest- no acute finding  Consults:   Therapeutics: Prednisone, albuterol  Discharge Meds:   Assessment/Plan: 35 year old female presents  today with acute asthma exacerbation. Upon my initial evaluation patient received breathing treatment, had no wheezing, but appear to be severely anxious. Patient was breathing rapidly, as I spoke with her inform her that her vital signs were Reassuring her anxiety decreased, she turned her baseline. Patient was observed in the ED with no return of wheezing, she had clear lung sounds oxygenation and 100% and she was not skipping. She is afebrile, chest x-ray shows no acute findings. Very low suspicion for pulmonary embolism. Patient will be discharged home with albuterol, prednisone, encouraged follow-up with her primary care for reevaluation. Patient verbalized her understanding and agreement to today's plan had no further questions or concerns at the time discharge        Eyvonne Mechanic, PA-C 09/12/15 0111  Derwood Kaplan, MD 09/12/15 [phone]

## 2015-09-11 NOTE — ED Notes (Signed)
Patient is complaining of Sob. Patient is also having a productive cough. Patients nose is stopped up. Patient having some pain in chest and stomach due to having trouble breathing.

## 2015-09-12 MED ORDER — ALBUTEROL SULFATE HFA 108 (90 BASE) MCG/ACT IN AERS: 1.0000 | INHALATION_SPRAY | RESPIRATORY_TRACT | Status: DC | PRN

## 2015-09-12 MED ORDER — ALBUTEROL SULFATE (5 MG/ML) 0.5% IN NEBU: 2.5000 mg | INHALATION_SOLUTION | Freq: Four times a day (QID) | RESPIRATORY_TRACT | Status: DC | PRN

## 2015-09-12 MED ORDER — PREDNISONE 50 MG PO TABS
50.0000 mg | ORAL_TABLET | Freq: Once | ORAL | Status: AC
  Administered 2015-09-12: 50 mg via ORAL
  Filled 2015-09-12: qty 1

## 2015-09-12 MED ORDER — PREDNISONE 20 MG PO TABS: 20.0000 mg | ORAL_TABLET | Freq: Two times a day (BID) | ORAL | Status: DC

## 2015-09-12 NOTE — ED Notes (Signed)
Patient was alert, oriented and stable upon discharge. RN went over AVS and patient had no further questions.  

## 2015-10-31 ENCOUNTER — Encounter: Payer: Self-pay | Admitting: Physical Therapy

## 2015-10-31 ENCOUNTER — Ambulatory Visit: Payer: Medicaid Other | Attending: Internal Medicine | Admitting: Physical Therapy

## 2015-10-31 DIAGNOSIS — R262 Difficulty in walking, not elsewhere classified: Secondary | ICD-10-CM

## 2015-10-31 DIAGNOSIS — M5442 Lumbago with sciatica, left side: Secondary | ICD-10-CM | POA: Insufficient documentation

## 2015-10-31 DIAGNOSIS — M79672 Pain in left foot: Secondary | ICD-10-CM | POA: Diagnosis present

## 2015-10-31 NOTE — Therapy (Addendum)
[place], [place], [address] Phone: [phone]   Fax:  [phone]  Physical Therapy Treatment/Discharge Summary  Patient Details  Name: [name] MRN: [id number] Date of Birth: 12-01-1980 Referring Provider: Nolene Ebbs, MD  Encounter Date: 10/31/2015      PT End of Session - 10/31/15 1644    Visit Number 1   Number of Visits 13   Date for PT Re-Evaluation 12/13/15   Authorization Type medicaid to self pay   PT Start Time 6962   PT Stop Time 1630   PT Time Calculation (min) 45 min   Activity Tolerance Patient limited by pain;Patient tolerated treatment well   Behavior During Therapy [facility] for tasks assessed/performed      Past Medical History  Diagnosis Date  . Asthma   . Fibromyalgia   . Family history of anesthesia complication     mother stopped breathing and survived   . Migraine     tension induced   . Stress incontinence   . Anemia     with pregnancy    Past Surgical History  Procedure Laterality Date  . Tympanostomy tube placement    . Hernia repair    . Tonsillectomy    . Adenoidectomy    . Cystoscopy N/A 01/09/2014    Procedure: CYSTOSCOPY;  Surgeon: Reece Packer, MD;  Location: [facility];  Service: Urology;  Laterality: N/A;  . Urethral diverticulum repair N/A 01/09/2014    Procedure: Excision of URETHRAL DIVERTICULUM;  Surgeon: Reece Packer, MD;  Location: [facility];  Service: Urology;  Laterality: N/A;  . Heel spur surgery  nov. 1, 2016    tendon surgery also    There were no vitals filed for this visit.      Subjective Assessment - 10/31/15 1550    Subjective Began with fibromyalgia, began having L sided severe pain. Referred to rheumatologist who diagnosed sciatic pain, xray showed OA and degneration. Has children, does not like to take medications.  Pain wearing shoes, esp on L, following surgical intervention- results in burning. Feels like she drags  her L leg due to heaviness and has lost balance several times.    How long can you sit comfortably? 5 min   How long can you stand comfortably? 5 min   Diagnostic tests xray   Patient Stated Goals stairs step over step, resolve foot dragging, work, stand/sit/walk, child care and household chores   Currently in Pain? Yes   Pain Score 7    Pain Location Back   Pain Orientation Left;Mid;Lower   Pain Descriptors / Indicators Spasm;Sharp;Dull;Heaviness   Pain Frequency Constant   Aggravating Factors  being in one position too long, bending to reach/lift, steps   Pain Relieving Factors stretches            [facility] PT Assessment - 10/31/15 0001    Assessment   Medical Diagnosis Lumbago   Referring Provider Nolene Ebbs, MD   Next MD Visit July   Prior Therapy no   Precautions   Precautions None   Restrictions   Weight Bearing Restrictions No   Balance Screen   Has the patient fallen in the past 6 months No   [place] residence   Living Arrangements Children   Prior Function   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   Cognition   Overall Cognitive Status Within Functional Limits for tasks assessed  ROM / Strength   AROM / PROM / Strength Strength   Strength   Strength Assessment Site Hip   Right/Left Hip Right;Left   Right Hip Flexion 3/5   Right Hip ADduction 4+/5  pain   Left Hip Flexion 3/5   Left Hip ADduction 4+/5   Palpation   SI assessment  bilat pain L>R   Palpation comment concordant pain with PA at L5 and palpation of L QL   Bed Mobility   Bed Mobility --  required cuing for log rolling    Ambulation/Gait   Gait Comments antalgic bilaterally, lacking trunk rotation                     [facility] Adult PT Treatment/Exercise - 10/31/15 0001    Exercises   Exercises Lumbar   Lumbar Exercises: Supine   AB Set Limitations deep breathing and transverse abdominis contraction  with knees over bolster                PT Education - 10/31/15 1644    Education provided Yes   Education Details anatomy of condition, POC, HEP   Person(s) Educated Patient   Methods Explanation;Demonstration;Tactile cues;Verbal cues   Comprehension Verbalized understanding;Returned demonstration;Verbal cues required;Tactile cues required;Need further instruction          PT Short Term Goals - 10/31/15 1651    PT SHORT TERM GOAL #1   Title pt will report centralization of pain beginning by 11/22/15   Time 3   Period Weeks   Status New   PT SHORT TERM GOAL #2   Title verbalize improved pain levels during daily and work activities   Time 3   Period Weeks   Status New           PT Long Term Goals - 10/31/15 1653    PT LONG TERM GOAL #1   Title able to climb stairs step over step to decrease abnormal use of LE and improve equality of R/LLE use by 7/28   Time 6   Period Weeks   Status New   PT LONG TERM GOAL #2   Title able to bend to grab objects from floor with no more than minimal pain in low back.    Time 6   Period Weeks   Status New   PT LONG TERM GOAL #3   Title able to be on her feet for 30 min without any noticable foot drag/heaviness to perform work and household activities   Time 6   Period Weeks   Status New   PT LONG TERM GOAL #4   Title able to sit and stand comfortably for at least 30 min pain <=3/10   Time 6   Period Weeks   Status New               Plan - 10/31/15 1646    Clinical Impression Statement Pt presents with complaints of LBP that extends down posterior L leg to the bottom of her foot that causes her foot to burn. 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 Concordant pain reproduced with palpation of L5 and L piriformis, also increased with passive SLR of LLE. Pt was taught today to lay supine with bolster under knees and contract abdominals for normal spinal alignment and core stabilization.  Pt will continue to benefit from skilled PT in order to stabilize lumbo pelvic region and centralize neural pain so she is  able to work and take care of home and self care activities.    Rehab Potential Fair   PT Frequency 2x / week   PT Duration 6 weeks   PT Treatment/Interventions ADLs/Self Care Home Management;Cryotherapy;Electrical Stimulation;Ultrasound;Traction;Moist Heat;Iontophoresis 68m/ml Dexamethasone;Gait training;Stair training;Functional mobility training;Therapeutic activities;Therapeutic exercise;Balance training;Patient/family education;Neuromuscular re-education;Manual techniques;Taping;Dry needling;Passive range of motion   PT Next Visit Plan core contraction, manual techniques to QL and piriformis   PT Home Exercise Plan TrA in supine with pillow under knees or seated in chair   Consulted and Agree with Plan of Care Patient      Patient will benefit from skilled therapeutic intervention in order to improve the following deficits and impairments:  Pain, Improper body mechanics, Impaired sensation, Postural dysfunction, Increased muscle spasms, Decreased activity tolerance, Decreased strength, Difficulty walking, Decreased balance  Visit Diagnosis: Bilateral low back pain with left-sided sciatica - Plan: PT plan of care cert/re-cert  Difficulty in walking, not elsewhere classified - Plan: PT plan of care cert/re-cert  Pain in left foot - Plan: PT plan of care cert/re-cert     Problem List Patient Active Problem List   Diagnosis Date Noted  . Morbid obesity with body mass index of 45.0-49.9 in adult ([facility] 07/18/2015  . 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 SVD (spontaneous vaginal delivery) 04/22/2013  . Indication for care in labor or delivery 04/21/2013  . ORTHOSTATIC HYPOTENSION 05/23/2009  . SYNCOPE 05/23/2009  . MIGRAINE HEADACHE 05/22/2009  . ASTHMA 05/22/2009  . UTI 05/22/2009  . PALPITATIONS 05/22/2009  . SHORTNESS OF BREATH 05/22/2009   Isaiah Torok C.  Consuella Scurlock PT, DPT 10/31/2015 5[place]GS[place] [address]Phone: [phone]  Fax:  [phone] Name: Melinda JAMROZMRN: [id number] of Birth: 1August 20, 1982 PHYSICAL THERAPY DISCHARGE SUMMARY  Visits from Start of Care: 1  Current functional level related to goals / functional outcomes: See above   Remaining deficits: See above   Education / Equipment: Anatomy of condition, POC, HEP  Plan: Patient agrees to discharge.  Patient goals were not met. Patient is being discharged due to not returning since the last visit.  ?????     Derell Bruun C. Adal Sereno PT, DPT 12/26/15 2:50 PM

## 2015-11-12 ENCOUNTER — Ambulatory Visit: Payer: Medicaid Other | Admitting: Physical Therapy

## 2015-11-13 ENCOUNTER — Encounter: Payer: Medicaid Other | Admitting: Physical Therapy

## 2015-11-20 ENCOUNTER — Ambulatory Visit: Payer: Medicaid Other | Attending: Internal Medicine | Admitting: Physical Therapy

## 2015-11-21 ENCOUNTER — Ambulatory Visit: Payer: Medicaid Other | Admitting: Physical Therapy

## 2015-11-27 ENCOUNTER — Encounter: Payer: Medicaid Other | Admitting: Physical Therapy

## 2015-11-28 ENCOUNTER — Encounter: Payer: Medicaid Other | Admitting: Physical Therapy

## 2015-12-04 ENCOUNTER — Encounter: Payer: Medicaid Other | Admitting: Physical Therapy

## 2015-12-05 ENCOUNTER — Encounter: Payer: Medicaid Other | Admitting: Physical Therapy

## 2017-02-15 ENCOUNTER — Encounter: Payer: Self-pay | Admitting: Family Medicine

## 2017-02-15 ENCOUNTER — Ambulatory Visit (INDEPENDENT_AMBULATORY_CARE_PROVIDER_SITE_OTHER): Payer: BC Managed Care – PPO | Admitting: Family Medicine

## 2017-02-15 VITALS — BP 120/80 | HR 73 | Temp 98.3°F | Resp 16 | Wt 300.0 lb

## 2017-02-15 DIAGNOSIS — H6123 Impacted cerumen, bilateral: Secondary | ICD-10-CM | POA: Diagnosis not present

## 2017-02-15 DIAGNOSIS — J4530 Mild persistent asthma, uncomplicated: Secondary | ICD-10-CM

## 2017-02-15 DIAGNOSIS — Z889 Allergy status to unspecified drugs, medicaments and biological substances status: Secondary | ICD-10-CM | POA: Diagnosis not present

## 2017-02-15 DIAGNOSIS — J0140 Acute pansinusitis, unspecified: Secondary | ICD-10-CM

## 2017-02-15 DIAGNOSIS — R05 Cough: Secondary | ICD-10-CM | POA: Diagnosis not present

## 2017-02-15 DIAGNOSIS — Z77120 Contact with and (suspected) exposure to mold (toxic): Secondary | ICD-10-CM | POA: Diagnosis not present

## 2017-02-15 DIAGNOSIS — H9201 Otalgia, right ear: Secondary | ICD-10-CM

## 2017-02-15 DIAGNOSIS — R059 Cough, unspecified: Secondary | ICD-10-CM

## 2017-02-15 MED ORDER — DOXYCYCLINE HYCLATE 100 MG PO TABS: 100.0000 mg | ORAL_TABLET | Freq: Two times a day (BID) | ORAL | 0 refills | Status: DC

## 2017-02-15 NOTE — Progress Notes (Signed)
Subjective:    Patient ID: [name], female    DOB: 01-13-1981, 36 y.o.   MRN: [id number]  HPI Chief Complaint  Patient presents with  . 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    She is new to the practice and here for an acute complaint.  Previous medical care-  [facility] - Dr. Fleet Contras  Other providers:  Rheumatologist- in [place]- [place]. Diagnosed with osteoarthritis, sciatica. No inflammatory arthritis.  Neurologist- Dr. Hosie Poisson for limb weakness. Diagnosed with fibromyalgia.  Urologist- Dr. Sherron Monday OB[facility]- [place] -Dr. Senaida Ores  History of asthma and allergies. Diagnosed as a child by allergist. States she has a history of allergies to pollen and mold and other things.  Is being treated with Advair and Singulair. Albuterol is being used 2-3 times per day since mid August.   States she has been on oral steroids intermittently for a couple of years. None since earlier this year.   Complains today of nasal congestion, rhinorrhea, itching and watery eyes, coughing, post nasal discharge, and dry throat. States she feels like her allergies are out of control. States this all started after moving into a new building with her job. States there is a lot of moisture in the building.  She is requesting to be tested for mold exposure. Thinks she may have been exposed at work.   States she has been off of gabapentin for 2-3 months due to insurance issues. States she was taking this for her fibromyalgia and back pain.  She has been taking phentermine. States she last took it earlier in 2018. She is taking tizanidine for back pain. Takes it one pill every other night.   She is married. E-sure for contraception. Works as a Psychologist, sport and exercise for [employer].   Reviewed allergies, medications, past medical, surgical, family, and  social history.   Review of Systems Pertinent positives and negatives in the history of present illness.     Objective:   Physical Exam  Constitutional: She is oriented to person, place, and time. She appears well-developed and well-nourished. She has a sickly appearance. No distress.  HENT:  Nose: Mucosal edema present. Right sinus exhibits maxillary sinus tenderness and frontal sinus tenderness. Left sinus exhibits maxillary sinus tenderness and frontal sinus tenderness.  Mouth/Throat: Uvula is midline and mucous membranes are normal. Posterior oropharyngeal erythema present.  Unable to visualize TMs due to cerumen impaction.   Eyes: Pupils are equal, round, and reactive to light. Conjunctivae and lids are normal.  Neck: Normal range of motion. Neck supple.  Cardiovascular: Normal rate, regular rhythm and normal heart sounds.  Exam reveals no gallop and no friction rub.   No murmur heard. Pulmonary/Chest: Effort normal and breath sounds normal.  Lymphadenopathy:    She has no cervical adenopathy.  Neurological: She is alert and oriented to person, place, and time.  Skin: Skin is warm and dry. No rash noted. She is not diaphoretic. No pallor.  Psychiatric: She has a normal mood and affect. Her speech is normal and behavior is normal.   BP 120/80   Pulse 73   Temp 98.3 F (36.8 C) (Oral)   Resp 16   Wt 300 lb (136.1 kg)   LMP 02/11/2017   SpO2 98%   BMI 56.68 kg/m      Assessment & Plan:  Acute non-recurrent pansinusitis - Plan:  CBC with Differential/Platelet, doxycycline (VIBRA-TABS) 100 MG tablet  Mild persistent asthma without complication - Plan: CBC with Differential/Platelet, Ambulatory referral to Allergy  Multiple allergies - Plan: CBC with Differential/Platelet, Allergy Panel 11, Mold Group, Ambulatory referral to Allergy  Bilateral impacted cerumen - Plan: Ambulatory referral to ENT  Right ear pain - Plan: CBC with Differential/Platelet, Ambulatory referral to  ENT  Cough - Plan: CBC with Differential/Platelet  Suspected exposure to mold - Plan: Allergy Panel 11, Mold Group, Ambulatory referral to Allergy  Cerumen impaction bilaterally. Sabrina, CMA, attempted ear lavage without success. Patient experienced discomfort and no cerumen was removed. We will refer her to ENT for further evaluation and treatment.  She appears to have acute sinusitis with underlying allergies. Will treat her with Doxycycline (PCN allergic) and Tessalon.   She does not appear to have an asthma exacerbation.  Recommend starting on oral antihistamine and Flonase.  Continue taking Singulair and Advair and using albuterol as needed.  She may try Mucinex for congestion and cough.  Will check labs for mold allergies per patient request and refer to allergist. She has not seen an allergist or ENT since childhood.  Follow up pending labs.  Spent 45 minutes with patient face to face with at least 50% in counseling and coordination.

## 2017-02-15 NOTE — Patient Instructions (Addendum)
Start the antibiotic and complete it as prescribed.  Start taking an oral antihistamine such as Xyzal, Allegra, Zyrtec or Claritin.  Use Flonase.  Continue on Singulair and Advair.  Use your Albuterol as needed but you should be needing this less often after the antibiotic starts working.   You can try Mucinex for cough. Stay well hydrated.   I am referring you to ENT and allergist and asthma specialist.

## 2017-02-16 LAB — CBC WITH DIFFERENTIAL/PLATELET
BASOS ABS: 33 {cells}/uL (ref 0–200)
Basophils Relative: 0.5 %
EOS ABS: 215 {cells}/uL (ref 15–500)
Eosinophils Relative: 3.3 %
HEMATOCRIT: 39.3 % (ref 35.0–45.0)
HEMOGLOBIN: 12.4 g/dL (ref 11.7–15.5)
LYMPHS ABS: 2093 {cells}/uL (ref 850–3900)
MCH: 24.6 pg — AB (ref 27.0–33.0)
MCHC: 31.6 g/dL — AB (ref 32.0–36.0)
MCV: 78 fL — AB (ref 80.0–100.0)
MPV: 9.9 fL (ref 7.5–12.5)
Monocytes Relative: 5.3 %
NEUTROS ABS: 3816 {cells}/uL (ref 1500–7800)
Neutrophils Relative %: 58.7 %
Platelets: 308 10*3/uL (ref 140–400)
RBC: 5.04 10*6/uL (ref 3.80–5.10)
RDW: 13.1 % (ref 11.0–15.0)
Total Lymphocyte: 32.2 %
WBC mixed population: 345 cells/uL (ref 200–950)
WBC: 6.5 10*3/uL (ref 3.8–10.8)

## 2017-02-16 LAB — ALLERGY PANEL 11, MOLD GROUP
Allergen, A. alternata, m6: <0.1 kU/L
Allergen, Mucor Racemosus, M4: <0.1 kU/L
CLASS: 0
CLASS: 0
CLASS: 0
CLASS: 0
Candida Albicans: <0.1 kU/L
Class: 0

## 2017-02-16 LAB — INTERPRETATION:

## 2017-02-17 ENCOUNTER — Ambulatory Visit (INDEPENDENT_AMBULATORY_CARE_PROVIDER_SITE_OTHER): Payer: BC Managed Care – PPO | Admitting: Allergy & Immunology

## 2017-02-17 ENCOUNTER — Encounter: Payer: Self-pay | Admitting: Allergy & Immunology

## 2017-02-17 VITALS — BP 122/82 | HR 72 | Temp 98.5°F | Resp 20 | Ht 61.5 in | Wt 299.0 lb

## 2017-02-17 DIAGNOSIS — J4540 Moderate persistent asthma, uncomplicated: Secondary | ICD-10-CM | POA: Diagnosis not present

## 2017-02-17 DIAGNOSIS — J302 Other seasonal allergic rhinitis: Secondary | ICD-10-CM | POA: Diagnosis not present

## 2017-02-17 DIAGNOSIS — J3089 Other allergic rhinitis: Secondary | ICD-10-CM

## 2017-02-17 DIAGNOSIS — T781XXD Other adverse food reactions, not elsewhere classified, subsequent encounter: Secondary | ICD-10-CM

## 2017-02-17 MED ORDER — BUDESONIDE-FORMOTEROL FUMARATE 160-4.5 MCG/ACT IN AERO: 2.0000 | INHALATION_SPRAY | Freq: Two times a day (BID) | RESPIRATORY_TRACT | 6 refills | Status: DC

## 2017-02-17 MED ORDER — FLUTICASONE PROPIONATE 93 MCG/ACT NA EXHU: 2.0000 | INHALANT_SUSPENSION | Freq: Every day | NASAL | 5 refills | Status: DC

## 2017-02-17 NOTE — Patient Instructions (Addendum)
.1. Moderate persistent asthma, uncomplicated - Lung testing did not look normal, but you did improve slightly with the nebulizer treatment. - Because you are having such severe symptoms, I would recommend that you starting a daily controller medication: Symbicort 160/4.5 two puffs twice daily with spacer. - Daily controller medication(s): Symbicort 160/4.25mcg two puffs twice daily with spacer + Singulair  daily - Prior to physical activity: ProAir 2 puffs 10-15 minutes before physical activity. - Rescue medications: ProAir 4 puffs every 4-6 hours as needed - Asthma control goals:  * Full participation in all desired activities (may need albuterol before activity) * Albuterol use two time or less a week on average (not counting use with activity) * Cough interfering with sleep two time or less a month * Oral steroids no more than once a year * No hospitalizations   2. Seasonal and perennial allergic rhinitis - Testing today showed: mouse, trees, weeds, grasses, molds, dust mites, cat and cockroach - Avoidance measures provided. - Start Xhance two sprays per nostril daily, Zyrtec (cetirizine)  tablet once daily and Singulair (montelukast)  daily - You can use an extra dose of the antihistamine, if needed, for breakthrough symptoms.  - Go to [employer] and look up cetirizine (there should be an option that provides 365 pills for around $15). - Consider nasal saline rinses 1-2 times daily to remove allergens from the nasal cavities as well as help with mucous clearance (this is especially helpful to do before the nasal sprays are given) - Consider allergy shots as a means of long-term control. - Allergy shots "re-train" and "reset" the immune system to ignore environmental allergens and decrease the resulting immune response to those allergens (sneezing, itchy watery eyes, runny nose, nasal congestion, etc).    - Allergy shots improve symptoms in 75-85% of patients.  - We can discuss more  at the next appointment if the medications are not working for you.  3. Adverse food reactions - Testing was negative to the most common food allergies (peanut, tree nut, soy, fish mix, shellfish mix, wheat, milk, egg). - Testing was also negative to oranage and strawberry.  - Continue to note any foods that cause reactions and we can do additional testing in the future if needed.   4. Return in about 4 weeks (around 03/17/2017).   Please inform us of any Emergency Department visits, hospitalizations, or changes in symptoms. Call us before going to the ED for breathing or allergy symptoms since we might be able to fit you in for a sick visit. Feel free to contact us anytime with any questions, problems, or concerns.  It was a pleasure to meet you today! 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  Websites that have reliable patient information: 1. American Academy of Asthma, Allergy, and Immunology: www.aaaai.org 2. Food Allergy Research and Education (FARE): foodallergy.org 3. Mothers of Asthmatics: http://www.asthmacommunitynetwork.org 4. American College of Allergy, Asthma, and Immunology: www.acaai.org   Election Day is coming up on Tuesday, November 6th! Make your voice heard! Register to vote at [email or web address]!     Reducing Pollen Exposure  The American Academy of Allergy, Asthma and Immunology suggests the following steps to reduce your exposure to pollen during allergy seasons.    1. Do not hang sheets or clothing out to dry; pollen may collect on these items. 2. Do not mow lawns or spend time around freshly cut grass; mowing stirs up pollen. 3. Keep windows closed at night.  Keep car windows closed while driving. 4. Minimize morning  activities outdoors, a time when pollen counts are usually at their highest. 5. Stay indoors as much as possible when pollen counts or humidity is high and on windy days when pollen tends to remain in the air longer. 6. Use air conditioning when possible.  Many air  conditioners have filters that trap the pollen spores. 7. Use a HEPA room air filter to remove pollen form the indoor air you breathe.  Control of Mold Allergen  Mold and fungi can grow on a variety of surfaces provided certain temperature and moisture conditions exist.  Outdoor molds grow on plants, decaying vegetation and soil.  The major outdoor mold, Alternaria and Cladosporium, are found in very high numbers during hot and dry conditions.  Generally, a late Summer - Fall peak is seen for common outdoor fungal spores.  Rain will temporarily lower outdoor mold spore count, but counts rise rapidly when the rainy period ends.  The most important indoor molds are Aspergillus and Penicillium.  Dark, humid and poorly ventilated basements are ideal sites for mold growth.  The next most common sites of mold growth are the bathroom and the kitchen.  Outdoor [employer] 1. Use air conditioning and keep windows closed 2. Avoid exposure to decaying vegetation. 3. Avoid leaf raking. 4. Avoid grain handling. 5. Consider wearing a face mask if working in moldy areas.  Indoor Mold Control 1. Maintain humidity below 50%. 2. Clean washable surfaces with 5% bleach solution. 3. Remove sources e.g. contaminated carpets.  Control of House Dust Mite Allergen    House dust mites play a major role in allergic asthma and rhinitis.  They occur in environments with high humidity wherever human skin, the food for dust mites is found. High levels have been detected in dust obtained from mattresses, pillows, carpets, upholstered furniture, bed covers, clothes and soft toys.  The principal allergen of the house dust mite is found in its feces.  A gram of dust may contain 1,000 mites and 250,000 fecal particles.  Mite antigen is easily measured in the air during house cleaning activities.    1. Encase mattresses, including the box spring, and pillow, in an air tight cover.  Seal the zipper end of the encased  mattresses with wide adhesive tape. 2. Wash the bedding in water of 130 degrees Farenheit weekly.  Avoid cotton comforters/quilts and flannel bedding: the most ideal bed covering is the dacron comforter. 3. Remove all upholstered furniture from the bedroom. 4. Remove carpets, carpet padding, rugs, and non-washable window drapes from the bedroom.  Wash drapes weekly or use plastic window coverings. 5. Remove all non-washable stuffed toys from the bedroom.  Wash stuffed toys weekly. 6. Have the room cleaned frequently with a vacuum cleaner and a damp dust-mop.  The patient should not be in a room which is being cleaned and should wait 1 hour after cleaning before going into the room. 7. Close and seal all heating outlets in the bedroom.  Otherwise, the room will become filled with dust-laden air.  An electric heater can be used to heat the room. 8. Reduce indoor humidity to less than 50%.  Do not use a humidifier.  Control of Dog or Cat Allergen  Avoidance is the best way to manage a dog or cat allergy. If you have a dog or cat and are allergic to dog or cats, consider removing the dog or cat from the home. If you have a dog or cat but don't want to find it a new  home, or if your family wants a pet even though someone in the household is allergic, here are some strategies that may help keep symptoms at bay:  1. Keep the pet out of your bedroom and restrict it to only a few rooms. Be advised that keeping the dog or cat in only one room will not limit the allergens to that room. 2. Don't pet, hug or kiss the dog or cat; if you do, wash your hands with soap and water. 3. High-efficiency particulate air (HEPA) cleaners run continuously in a bedroom or living room can reduce allergen levels over time. 4. Regular use of a high-efficiency vacuum cleaner or a central vacuum can reduce allergen levels. 5. Giving your dog or cat a bath at least once a week can reduce airborne allergen.  Control of Cockroach  Allergen  Cockroach allergen has been identified as an important cause of acute attacks of asthma, especially in urban settings.  There are fifty-five species of cockroach that exist in the [place], however only three, the [place], [place] species produce allergen that can affect patients with Asthma.  Allergens can be obtained from fecal particles, egg casings and secretions from cockroaches.    1. Remove food sources. 2. Reduce access to water. 3. Seal access and entry points. 4. Spray runways with 0.5-1% Diazinon or Chlorpyrifos 5. Blow boric acid power under stoves and refrigerator. 6. Place bait stations (hydramethylnon) at feeding sites.

## 2017-02-17 NOTE — Progress Notes (Signed)
NEW PATIENT  Date of Service/Encounter:  02/17/17  Referring provider: Avanell Shackleton, NP-C   Assessment:   Moderate persistent asthma, uncomplicated  Seasonal and perennial allergic rhinitis (mouse, trees, weeds, grasses, molds, dust mites, cat and cockroach)  Adverse food reaction - with negative testing to the most common foods as well as strawberry and orange   Asthma Reportables:  Severity: moderate persistent  Risk: high Control: not well controlled  Plan/Recommendations:   1. Moderate persistent asthma, uncomplicated - Lung testing did not look normal, but you did improve slightly with the nebulizer treatment. - Because you are having such severe symptoms, I would recommend that you starting a daily controller medication: Symbicort 160/4.5 two puffs twice daily with spacer. - We could consider the use of a biologic in the future, but we will give a daily controller some time to work instead.  - Daily controller medication(s): Symbicort 160/4.78mcg two puffs twice daily with spacer + Singulair  daily - Prior to physical activity: ProAir 2 puffs 10-15 minutes before physical activity. - Rescue medications: ProAir 4 puffs every 4-6 hours as needed - Asthma control goals:  * Full participation in all desired activities (may need albuterol before activity) * Albuterol use two time or less a week on average (not counting use with activity) * Cough interfering with sleep two time or less a month * Oral steroids no more than once a year * No hospitalizations  2. Seasonal and perennial allergic rhinitis - Testing today showed: mouse, trees, weeds, grasses, molds, dust mites, cat and cockroach - Avoidance measures provided. - Start Xhance two sprays per nostril daily, Zyrtec (cetirizine)  tablet once daily and Singulair (montelukast)  daily - You can use an extra dose of the antihistamine, if needed, for breakthrough symptoms.  - Go to [employer] and look up  cetirizine (there should be an option that provides 365 pills for around $15). - Consider nasal saline rinses 1-2 times daily to remove allergens from the nasal cavities as well as help with mucous clearance (this is especially helpful to do before the nasal sprays are given) - Consider allergy shots as a means of long-term control. - Allergy shots "re-train" and "reset" the immune system to ignore environmental allergens and decrease the resulting immune response to those allergens (sneezing, itchy watery eyes, runny nose, nasal congestion, etc).    - Allergy shots improve symptoms in 75-85% of patients.  - We can discuss more at the next appointment if the medications are not working for you.  3. Adverse food reactions - Testing was negative to the most common food allergies (peanut, tree nut, soy, fish mix, shellfish mix, wheat, milk, egg). - Testing was also negative to oranage and strawberry.  - Continue to note any foods that cause reactions and we can do additional testing in the future if needed.   4. Return in about 4 weeks (around 03/17/2017).  Subjective:   [name] is a 36 y.o. female presenting today for evaluation of  Chief Complaint  Patient presents with  . Allergic Rhinitis     headaches, itchy watery eyes, throat itching wheezing , sneezing and couging    [name] has a history of the following: Patient Active Problem List   Diagnosis Date Noted  . Morbid obesity with body mass index of 45.0-49.9 in adult [facility]) 07/18/2015  . 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 ORTHOSTATIC HYPOTENSION 05/23/2009  . SYNCOPE 05/23/2009  . MIGRAINE HEADACHE 05/22/2009  . Asthma  05/22/2009  . UTI 05/22/2009  . PALPITATIONS 05/22/2009  . SHORTNESS OF BREATH 05/22/2009    History obtained from: chart review and patient.  [name] was referred by Avanell Shackleton, NP-C.     [name] is a 36 y.o. female presenting for an alklergy and asthma eval. She  reports that she will start having itchy eyes and runny nose with some wheezing from the upper airway. She does have some mold at work, which is why she is thinking that this is an allergy-mediated thing. Symptoms were under control but then her symptoms worsened when she started working in her current environment. 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 From August 1st until now, she has had two episodes of illnesses. She does get diagnosed with sinusitis every two months.    She was tested when she was a child. She never had allergy shots but she was treated for her asthma. She was on Flonase for a period of time but is not sure that this helped at all.   Asthma/Respiratory Symptom History: Jahnae was diagnosed with asthma when she was a child. She had pneumonia and was hospitalized as an infant. She was on albuterol PRN. She was on Advair Diskus, which was prescribed by her PCP. She remained on Advair continuously for one year; she did not feel that it helped. When she got sick, it did not seem to help. She stopped going to this PCP, which is why she never continued with the Advair. She does feel slightly worse being off of the Advair. She reports that the winter time is the worst time of the year for her. She did require an EMS call in December 2016. She was hospitalized in 2017 for an asthma exacerbation. She estimates that she is put on prednisone every 8-12 weeks at the very least. She does report coughing at night every night. In fact, she will wet herself from the coughing.   Allergic Rhinitis Symptom History: She does have a history of chronic allergic rhinitis with nasal congestion and rShe does not spend time with rhinorrhea. Symptoms have been ongoing but have worsened throughout the course of the last couple of years. 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 She reports that there is mold in the school where she is working, and she does have pictures today that demonstrate a large portion of dropped ceiling tiles that are coated with mold. She is never around cats/dogs, so she is unsure whether symptoms worsen around animals.   Food Allergy Symptom History: She did have one isolated episode of face swelling with fish. She does get itchy mouth occasionally with fruit. She reports eye swelling as well as lip swelling from various fresh fruits, but she is not sure of the triggers.   Otherwise, there is no history of other atopic diseases, including drug allergies, stinging insect allergies, or urticaria. There is no significant infectious history. Vaccinations are up to date.    Past Medical History: Patient Active Problem List   Diagnosis Date Noted  . Morbid obesity with body mass index of 45.0-49.9 in adult [facility]) 07/18/2015  . 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 ORTHOSTATIC HYPOTENSION 05/23/2009  . SYNCOPE 05/23/2009  . MIGRAINE HEADACHE 05/22/2009  . Asthma 05/22/2009  . UTI 05/22/2009  . PALPITATIONS 05/22/2009  . SHORTNESS OF BREATH 05/22/2009  Medication List:  Allergies as of 02/17/2017      Reactions   Amoxicillin Hives   Has patient had a PCN reaction causing immediate rash, facial/tongue/throat swelling, SOB or lightheadedness with hypotension: Yes Has patient had a PCN reaction causing severe rash involving mucus membranes or skin necrosis: Yes Has patient had a PCN reaction that required hospitalization No Has patient had a PCN reaction occurring within the last 10 years: No If all of the above answers are "NO", then may proceed with Cephalosporin use.   Contrast Media [iodinated Diagnostic Agents] Hives, Itching   Latex Other (See Comments)   Burns and blisters skin   Penicillins Hives   Has patient had a PCN reaction causing immediate rash, facial/tongue/throat swelling, SOB or  lightheadedness with hypotension: Yes Has patient had a PCN reaction causing severe rash involving mucus membranes or skin necrosis: Yes Has patient had a PCN reaction that required hospitalization No Has patient had a PCN reaction occurring within the last 10 years: No If all of the above answers are "NO", then may proceed with Cephalosporin use.      Medication List       Accurate as of 02/17/17  4:04 PM. Always use your most recent med list.          acetaminophen-codeine 300-30 MG tablet Commonly known as:  TYLENOL #3 Take 1 tablet by mouth every 8 (eight) hours as needed for moderate pain.   albuterol 108 (90 Base) MCG/ACT inhaler Commonly known as:  PROVENTIL HFA;VENTOLIN HFA Inhale 1-2 puffs into the lungs every 4 (four) hours as needed for wheezing or shortness of breath. For shortness of breath   albuterol (5 MG/ML) 0.5% nebulizer solution Commonly known as:  PROVENTIL Take 0.5 mLs (2.5 mg total) by nebulization every 6 (six) hours as needed for wheezing or shortness of breath.   doxycycline 100 MG tablet Commonly known as:  VIBRA-TABS Take 1 tablet (100 mg total) by mouth 2 (two) times daily.   Fluticasone-Salmeterol 250-50 MCG/DOSE Aepb Commonly known as:  ADVAIR Inhale 1 puff into the lungs daily.   gabapentin 300 MG capsule Commonly known as:  NEURONTIN Take 300 mg by mouth 3 (three) times daily.   montelukast 10 MG tablet Commonly known as:  SINGULAIR Take 1 tablet (10 mg total) by mouth at bedtime.   phentermine 37.5 MG capsule Take 37.5 mg by mouth every morning.   tiZANidine 4 MG tablet Commonly known as:  ZANAFLEX Take 4 mg by mouth at bedtime.       Birth History: non-contributory.   Developmental History: non-contributory.   Past Surgical History: Past Surgical History:  Procedure Laterality Date  . ADENOIDECTOMY    . CYSTOSCOPY N/A 01/09/2014   Procedure: CYSTOSCOPY;  Surgeon: Martina Sinner, MD;  Location: [facility];  Service:  Urology;  Laterality: N/A;  . HEEL SPUR SURGERY  nov. 1, 2016   tendon surgery also  . HERNIA REPAIR    . TONSILLECTOMY    . TYMPANOSTOMY TUBE PLACEMENT    . URETHRAL DIVERTICULUM REPAIR N/A 01/09/2014   Procedure: Excision of URETHRAL DIVERTICULUM;  Surgeon: Martina Sinner, MD;  Location: [facility];  Service: Urology;  Laterality: N/A;     Family History: Family History  Problem Relation Age of Onset  . Heart disease Mother   . Asthma Mother   . COPD Mother   . Stroke Father   . Seizures Father   . Heart disease Maternal Grandmother  Social History: Reve lives at home with her husband and four children (aged 3, 7, 8,and 32). They live in a townhome with carpeting throughout the home. There is electric heating and central cooling. They have no dust mite coverings on the bedding. There is no tobacco exposure. 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    Review of Systems: a 14-point review of systems is pertinent for what is mentioned in HPI.  Otherwise, all other systems were negative. Constitutional: negative other than that listed in the HPI Eyes: negative other than that listed in the HPI Ears, nose, mouth, throat, and face: negative other than that listed in the HPI Respiratory: negative other than that listed in the HPI Cardiovascular: negative other than that listed in the HPI Gastrointestinal: negative other than that listed in the HPI Genitourinary: negative other than that listed in the HPI Integument: negative other than that listed in the HPI Hematologic: negative other than that listed in the HPI Musculoskeletal: negative other than that listed in the HPI Neurological: negative other than that listed in the HPI Allergy/Immunologic: negative other than that listed in the HPI    Objective:   Blood pressure 122/82, pulse 72, temperature 98.5 F (36.9 C), temperature source  Oral, resp. rate 20, height 5' 1.5" (1.562 m), weight 299 lb (135.6 kg), last menstrual period 02/11/2017. Body mass index is 55.58 kg/m.   Physical Exam:  General: Alert, interactive, in no acute distress. Pleasant female. Obese.  Eyes: No conjunctival injection present on the right, No conjunctival injection present on the left, No discharge on the right, No discharge on the left and allergic shiners present bilaterally Ears: Right TM pearly gray with normal light reflex, Left TM pearly gray with normal light reflex, Right TM intact without perforation and Left TM intact without perforation.  Nose/Throat: External nose within normal limits, nasal crease present and septum midline, turbinates markedly edematous and pale with clear discharge, post-pharynx erythematous with cobblestoning in the posterior oropharynx. Tonsils 3+ without exudates Neck: Supple without thyromegaly.  Adenopathy: shoddy bilateral anterior cervical lymphadenopathy. and no enlarged lymph nodes appreciated in the occipital, axillary, epitrochlear, inguinal, or popliteal regions. Lungs: Decreased breath sounds bilaterally without wheezing, rhonchi or rales. No increased work of breathing. CV: Normal S1/S2, no murmurs. Capillary refill <2 seconds.  Abdomen: Nondistended, nontender. No guarding or rebound tenderness. Bowel sounds present in all fields and hypoactive  Skin: Warm and dry, without lesions or rashes. Extremities:  No clubbing, cyanosis or edema. Neuro:   Grossly intact. No focal deficits appreciated. Responsive to questions.  Diagnostic studies:   Spirometry: results abnormal (FEV1: 1.56/64%, FVC: 1.88/65%, FEV1/FVC: 83%).    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 Albuterol/Atrovent nebulizer treatment given in clinic with improvement in FEV1, but not significant per ATS criteria.   Allergy Studies:   Indoor/Outdoor Percutaneous Adult Environmental Panel: positive to Df mite, Dp mites,  cockroach and mouse. Otherwise negative with adequate controls.  Indoor/Outdoor Selected Intradermal Environmental Panel: positive to [place] grass, [place] grass, Grass mix, weed mix, tree mix, mold mix #1, mold mix #3, mold mix #4 and cat. Otherwise negative with adequate controls.  Selected Food Panel: negative to Peanut, Soy, Wheat, Corn, Milk, Egg, Casein, Shellfish Mix, Fish Mix, [place], Orange and Strawberry       Malachi Bonds, MD FAAAAI [facility]

## 2017-02-23 ENCOUNTER — Telehealth: Payer: Self-pay | Admitting: Allergy & Immunology

## 2017-02-23 NOTE — Telephone Encounter (Signed)
Patient had allergy testing done 02-17-17. She has some questions about the tests that her principal (boss) has asked her to clarify.

## 2017-02-23 NOTE — Telephone Encounter (Addendum)
Pt is exposed to mold and pollen at work. She is constantly having allergy symptoms, even after taking her medications. She wants to know if her exposure was causing these symptoms because she has a meeting about possibly switching offices tomorrow. I advised the we emphasize a lot on control of exposure and it plays a major part in her symptoms.

## 2017-03-23 ENCOUNTER — Ambulatory Visit: Payer: BC Managed Care – PPO | Admitting: Allergy and Immunology

## 2017-09-22 ENCOUNTER — Emergency Department (HOSPITAL_COMMUNITY)
Admission: EM | Admit: 2017-09-22 | Discharge: 2017-09-22 | Disposition: A | Discharge status: Home / Self Care | Payer: BC Managed Care – PPO | Attending: Emergency Medicine | Admitting: Emergency Medicine

## 2017-09-22 ENCOUNTER — Other Ambulatory Visit: Payer: Self-pay

## 2017-09-22 ENCOUNTER — Emergency Department (HOSPITAL_COMMUNITY): Payer: BC Managed Care – PPO

## 2017-09-22 ENCOUNTER — Encounter (HOSPITAL_COMMUNITY): Payer: Self-pay | Admitting: Emergency Medicine

## 2017-09-22 DIAGNOSIS — J45909 Unspecified asthma, uncomplicated: Secondary | ICD-10-CM | POA: Diagnosis not present

## 2017-09-22 DIAGNOSIS — B349 Viral infection, unspecified: Secondary | ICD-10-CM | POA: Insufficient documentation

## 2017-09-22 DIAGNOSIS — R0789 Other chest pain: Secondary | ICD-10-CM | POA: Diagnosis present

## 2017-09-22 DIAGNOSIS — Z91040 Latex allergy status: Secondary | ICD-10-CM | POA: Diagnosis not present

## 2017-09-22 DIAGNOSIS — Z79899 Other long term (current) drug therapy: Secondary | ICD-10-CM | POA: Diagnosis not present

## 2017-09-22 LAB — BASIC METABOLIC PANEL
ANION GAP: 9 (ref 5–15)
BUN: 11 mg/dL (ref 6–20)
CO2: 25 mmol/L (ref 22–32)
Calcium: 9.2 mg/dL (ref 8.9–10.3)
Chloride: 105 mmol/L (ref 101–111)
Creatinine, Ser: 0.76 mg/dL (ref 0.44–1.00)
GFR calc Af Amer: >60 mL/min (ref >60)
GFR calc non Af Amer: >60 mL/min (ref >60)
GLUCOSE: 93 mg/dL (ref 65–99)
POTASSIUM: 4 mmol/L (ref 3.5–5.1)
Sodium: 139 mmol/L (ref 135–145)

## 2017-09-22 LAB — CBC
HEMATOCRIT: 38.7 % (ref 36.0–46.0)
HEMOGLOBIN: 12.2 g/dL (ref 12.0–15.0)
MCH: 25.3 pg — AB (ref 26.0–34.0)
MCHC: 31.5 g/dL (ref 30.0–36.0)
MCV: 80.1 fL (ref 78.0–100.0)
Platelets: 269 10*3/uL (ref 150–400)
RBC: 4.83 MIL/uL (ref 3.87–5.11)
RDW: 14.2 % (ref 11.5–15.5)
WBC: 6.7 10*3/uL (ref 4.0–10.5)

## 2017-09-22 LAB — I-STAT TROPONIN, ED: Troponin i, poc: 0.05 ng/mL (ref 0.00–0.08)

## 2017-09-22 LAB — I-STAT BETA HCG BLOOD, ED (MC, WL, AP ONLY): I-stat hCG, quantitative: 6 m[IU]/mL — ABNORMAL HIGH (ref <5)

## 2017-09-22 MED ORDER — ACETAMINOPHEN 500 MG PO TABS
1000.0000 mg | ORAL_TABLET | Freq: Once | ORAL | Status: AC
  Administered 2017-09-22: 1000 mg via ORAL
  Filled 2017-09-22: qty 2

## 2017-09-22 MED ORDER — GI COCKTAIL ~~LOC~~
30.0000 mL | Freq: Once | ORAL | Status: AC
Start: 2017-09-22 — End: 2017-09-22
  Administered 2017-09-22: 30 mL via ORAL
  Filled 2017-09-22: qty 30

## 2017-09-22 MED ORDER — FAMOTIDINE 20 MG PO TABS: 20.0000 mg | ORAL_TABLET | Freq: Every day | ORAL | 0 refills | Status: DC

## 2017-09-22 NOTE — ED Provider Notes (Signed)
[facility]-EMERGENCY DEPT Provider Note   CSN: [id number] Arrival date & time: 09/22/17  1038     History   Chief Complaint Chief Complaint  Patient presents with  . Chest Pain    HPI [name] is a 37 y.o. female presenting for evaluation of chest pain.  Patient states that yesterday, she started to develop central chest pain.  It is described as sharp.  It was initially intermittent, but is now constant.  Pain is now radiating into the patient's shoulders and neck.  She stated it is worse with movement of her arms and head.  Patient states that for the past 3 to 4 days, she has been having nausea and loose stools.  She denies vomiting.  She reports decreased oral intake.  Today she has been feeling more tired than normal.  She states that her voice is hoarse, and she has mild cough.  She denies fevers, chills, ear pain, eye pain, shortness of breath, abdominal pain, urinary symptoms, leg pain or swelling.  She denies recent travel, surgeries, immobilization, trauma, history of PE/DVT, or estrogen use.  She denies previous cardiac history.  She does not smoke cigarettes, denies alcohol or drug use.  She denies family history of cardiac issues.  She states she has a history of asthma, takes medication for this.  This feels different than her normal asthma attack.  She has not taken any Zanaflex recently.  She has not tried anything for her symptoms including Tylenol or ibuprofen.  Nothing has made it better.  HPI  Past Medical History:  Diagnosis Date  . Anemia    with pregnancy  . Asthma   . Family history of anesthesia complication    mother stopped breathing and survived   . Fibromyalgia   . Migraine    tension induced   . Stress incontinence     Patient Active Problem List   Diagnosis Date Noted  . Moderate persistent asthma, uncomplicated 02/17/2017  . Seasonal and perennial allergic rhinitis 02/17/2017  . Morbid obesity with body mass index of  45.0-49.9 in adult [facility]) 07/18/2015  . 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 ORTHOSTATIC HYPOTENSION 05/23/2009  . SYNCOPE 05/23/2009  . MIGRAINE HEADACHE 05/22/2009  . Asthma 05/22/2009  . UTI 05/22/2009  . PALPITATIONS 05/22/2009  . SHORTNESS OF BREATH 05/22/2009    Past Surgical History:  Procedure Laterality Date  . ADENOIDECTOMY    . CYSTOSCOPY N/A 01/09/2014   Procedure: CYSTOSCOPY;  Surgeon: Martina Sinner, MD;  Location: [facility];  Service: Urology;  Laterality: N/A;  . HEEL SPUR SURGERY  nov. 1, 2016   tendon surgery also  . HERNIA REPAIR    . TONSILLECTOMY    . TYMPANOSTOMY TUBE PLACEMENT    . URETHRAL DIVERTICULUM REPAIR N/A 01/09/2014   Procedure: Excision of URETHRAL DIVERTICULUM;  Surgeon: Martina Sinner, MD;  Location: [facility];  Service: Urology;  Laterality: N/A;     OB History    Gravida  5   Para  4   Term  4   Preterm      AB  1   Living  4     SAB      TAB      Ectopic  1   Multiple      Live Births  4            Home Medications    Prior to Admission medications   Medication Sig Start Date End Date  Taking? Authorizing Provider  albuterol (PROVENTIL HFA;VENTOLIN HFA) 108 (90 Base) MCG/ACT inhaler Inhale 1-2 puffs into the lungs every 4 (four) hours as needed for wheezing or shortness of breath. For shortness of breath 09/12/15  Yes Hedges, Tinnie Gens, PA-C  albuterol (PROVENTIL) (5 MG/ML) 0.5% nebulizer solution Take 0.5 mLs (2.5 mg total) by nebulization every 6 (six) hours as needed for wheezing or shortness of breath. 09/12/15  Yes Hedges, Tinnie Gens, PA-C  cetirizine (ZYRTEC) 10 MG tablet Take 10 mg by mouth daily.   Yes [provider]  diclofenac (VOLTAREN) 75 MG EC tablet Take 75 mg by mouth daily.   Yes [provider]  Fluticasone-Salmeterol (ADVAIR) 250-50 MCG/DOSE AEPB Inhale 1 puff into the lungs daily.   Yes [provider]  tiZANidine (ZANAFLEX) 4 MG tablet Take 4 mg by mouth at bedtime.    Yes  [provider]  acetaminophen-codeine (TYLENOL #3) 300-30 MG tablet Take 1 tablet by mouth every 8 (eight) hours as needed for moderate pain. Patient not taking: Reported on 09/22/2017 07/21/15   Conley Canal, MD  budesonide-formoterol [facility]) 160-4.5 MCG/ACT inhaler Inhale 2 puffs into the lungs 2 (two) times daily. 02/17/17 03/19/17  Alfonse Spruce, MD  doxycycline (VIBRA-TABS) 100 MG tablet Take 1 tablet (100 mg total) by mouth 2 (two) times daily. Patient not taking: Reported on 09/22/2017 02/15/17   Hetty Blend L, NP-C  famotidine (PEPCID) 20 MG tablet Take 1 tablet (20 mg total) by mouth daily. 09/22/17   Reida Hem, PA-C  gabapentin (NEURONTIN) 300 MG capsule Take 300 mg by mouth 3 (three) times daily.    [provider]  montelukast (SINGULAIR) 10 MG tablet Take 1 tablet (10 mg total) by mouth at bedtime. 07/21/15   Conley Canal, MD    Family History Family History  Problem Relation Age of Onset  . Heart disease Mother   . Asthma Mother   . COPD Mother   . Stroke Father   . Seizures Father   . Heart disease Maternal Grandmother     Social History Social History   Tobacco Use  . Smoking status: Never Smoker  . Smokeless tobacco: Never Used  Substance Use Topics  . Alcohol use: No  . Drug use: No     Allergies   Amoxicillin; Contrast media [iodinated diagnostic agents]; Latex; and Penicillins   Review of Systems Review of Systems  HENT:       Voice is hoarse  Respiratory: Positive for cough.   Cardiovascular: Positive for chest pain.  Gastrointestinal: Positive for diarrhea and nausea.  All other systems reviewed and are negative.    Physical Exam Updated Vital Signs BP (!) 150/100   Pulse 78   Temp 98.1 F (36.7 C) (Oral)   Resp 20   Ht  (1.575 m)   Wt 134.7 kg (297 lb)   LMP 08/15/2017   SpO2 99%   BMI 54.32 kg/m   Physical Exam  Constitutional: She is oriented to person, place, and time. She appears  well-developed and well-nourished. No distress.  Resting comfortably in bed in no apparent distress.  HENT:  Head: Normocephalic and atraumatic.  Right Ear: Tympanic membrane, external ear and ear canal normal.  Left Ear: Tympanic membrane, external ear and ear canal normal.  Nose: Nose normal.  Mouth/Throat: Uvula is midline, oropharynx is clear and moist and mucous membranes are normal.  Voice is hoarse without muffled sound.  OP clear without tonsillar swelling or exudate.  Uvula midline with  equal palate rise.  TMs nonerythematous and not bulging bilaterally.  Eyes: Pupils are equal, round, and reactive to light. Conjunctivae and EOM are normal.  Neck: Normal range of motion. Neck supple.  Cardiovascular: Normal rate, regular rhythm and intact distal pulses.  Pulmonary/Chest: Effort normal and breath sounds normal. No respiratory distress. She has no wheezes. She exhibits tenderness.  Tenderness palpation of anterior chest wall.  Clear lung sounds in all fields.  Abdominal: Soft. Bowel sounds are normal. She exhibits no distension and no mass. There is no tenderness. There is no guarding.  Abdomen soft without rigidity, guarding, or distention.  No tenderness to palpation.  Musculoskeletal: Normal range of motion.  No leg pain or swelling.  Radial pedal pulses equal bilaterally.  Neurological: She is alert and oriented to person, place, and time.  Skin: Skin is warm and dry.  Psychiatric: She has a normal mood and affect.  Nursing note and vitals reviewed.    ED Treatments / Results  Labs (all labs ordered are listed, but only abnormal results are displayed) Labs Reviewed  CBC - Abnormal; Notable for the following components:      Result Value   MCH 25.3 (*)    All other components within normal limits  I-STAT BETA HCG BLOOD, ED ([facility], [facility], [facility] ONLY) - Abnormal; Notable for the following components:   I-stat hCG, quantitative 6.0 (*)    All other components within normal limits    BASIC METABOLIC PANEL  I-STAT TROPONIN, ED    EKG EKG Interpretation  Date/Time:  Wednesday Sep 22 2017 10:53:46 EDT Ventricular Rate:  92 PR Interval:    QRS Duration: 80 QT Interval:  443 QTC Calculation: 549 R Axis:   18 Text Interpretation:  Sinus rhythm Low voltage, precordial leads Abnormal R-wave progression, early transition Borderline T abnormalities, anterior leads Prolonged QT interval Baseline wander in lead(s) II III aVF V2 V6 When compared to prior,  similar QTC prolongation.  No STEMI Confirmed by Theda Belfast ([id number]) on 09/22/2017 6:43:07 PM   Radiology Dg Chest 2 View  Result Date: 09/22/2017 CLINICAL DATA:  37 year old with mid and LEFT-sided chest pain that began yesterday, associated with mild shortness of breath. Patient also complains of LEFT UPPER extremity pain, back pain and BILATERAL LOWER extremity pain. Current history of asthma. EXAM: CHEST - 2 VIEW COMPARISON:  09/11/2015, 07/18/2015 and earlier. FINDINGS: Cardiac silhouette upper normal in size. Hilar and mediastinal contours unremarkable. Lungs clear. Bronchovascular markings normal. Pulmonary vascularity normal. No visible pleural effusions. No pneumothorax. Visualized bony thorax intact. IMPRESSION: No acute cardiopulmonary disease. Electronically Signed   By: Hulan Saas M.D.   On: 09/22/2017 11:19    Procedures Procedures (including critical care time)  Medications Ordered in ED Medications  gi cocktail (Maalox,Lidocaine,Donnatal) (30 mLs Oral Given 09/22/17 1909)  acetaminophen (TYLENOL) tablet 1,000 mg (1,000 mg Oral Given 09/22/17 1909)     Initial Impression / Assessment and Plan / ED Course  I have reviewed the triage vital signs and the nursing notes.  Pertinent labs & imaging results that were available during my care of the patient were reviewed by me and considered in my medical decision making (see chart for details).     Presenting for evaluation of chest pain.  Physical exam  reassuring, patient is afebrile not tachycardic.  She appears nontoxic.  Further history shows patient with several day history of GI symptoms, and recent cough and voice hoarseness.  Likely viral syndrome.  Labs reassuring, no leukocytosis.  Creatinine stable.  Troponin negative.  Hcg likely falsely elevated, as it is minimally high and pt denies opportunity for pregnancy.  EKG without STEMI, shows long QT, improved from prior.  Chest x-ray reviewed and interpreted by me, no PNA, pneumothorax, or effusions.  Findings discussed with patient.  Discussed that I doubt ACS, PE, or infection at this time.  Likely viral. Heart score 1, low risk.  Will treat symptomatically with GI cocktail and acetaminophen. BP mildly elevated.   Patient states that she has to leave to pick up her kids from school.  She does not want symptom medic treatment at this time.  Pt give rx for pepcid and told to f/u for further evaluation of her BP with her PCP.  At this time, patient appears safe for discharge.  Return precautions given.  Patient states she understands agrees plan.  Final Clinical Impressions(s) / ED Diagnoses   Final diagnoses:  Viral illness    ED Discharge Orders        Ordered    famotidine (PEPCID) 20 MG tablet  Daily     09/22/17 1911       Alveria Apley, PA-C 09/22/17 2348    Tegeler, Canary Brim, MD 09/22/17 2351

## 2017-09-22 NOTE — ED Triage Notes (Signed)
Pt complaint of left/middle chest pain with associated left arm, back, and leg pain. Onset yesterday; worsening today.

## 2017-09-22 NOTE — Discharge Instructions (Addendum)
Take Pepcid once a day to help with nausea and abdominal pain.  Use Tylenol or ibuprofen as needed for pain. Make sure you are staying well-hydrated with water. Follow-up with your primary care doctor for further evaluation of your symptoms and for recheck of your blood pressure. Return to the emergency room if you develop difficulty breathing, persistent vomiting, or any new or concerning symptoms.

## 2017-09-29 ENCOUNTER — Encounter (HOSPITAL_COMMUNITY): Payer: Self-pay | Admitting: *Deleted

## 2017-09-29 ENCOUNTER — Inpatient Hospital Stay (HOSPITAL_COMMUNITY)
Admission: AD | Admit: 2017-09-29 | Discharge: 2017-09-29 | Disposition: A | Discharge status: Home / Self Care | Payer: BC Managed Care – PPO | Source: Ambulatory Visit | Attending: Obstetrics and Gynecology | Admitting: Obstetrics and Gynecology

## 2017-09-29 DIAGNOSIS — B9689 Other specified bacterial agents as the cause of diseases classified elsewhere: Secondary | ICD-10-CM

## 2017-09-29 DIAGNOSIS — R110 Nausea: Secondary | ICD-10-CM | POA: Insufficient documentation

## 2017-09-29 DIAGNOSIS — N644 Mastodynia: Secondary | ICD-10-CM | POA: Diagnosis present

## 2017-09-29 DIAGNOSIS — Z79899 Other long term (current) drug therapy: Secondary | ICD-10-CM | POA: Insufficient documentation

## 2017-09-29 DIAGNOSIS — M545 Low back pain: Secondary | ICD-10-CM | POA: Diagnosis not present

## 2017-09-29 DIAGNOSIS — J45909 Unspecified asthma, uncomplicated: Secondary | ICD-10-CM | POA: Diagnosis not present

## 2017-09-29 DIAGNOSIS — N760 Acute vaginitis: Secondary | ICD-10-CM | POA: Diagnosis not present

## 2017-09-29 DIAGNOSIS — Z880 Allergy status to penicillin: Secondary | ICD-10-CM | POA: Diagnosis not present

## 2017-09-29 DIAGNOSIS — Z7951 Long term (current) use of inhaled steroids: Secondary | ICD-10-CM | POA: Insufficient documentation

## 2017-09-29 DIAGNOSIS — M797 Fibromyalgia: Secondary | ICD-10-CM | POA: Insufficient documentation

## 2017-09-29 DIAGNOSIS — R102 Pelvic and perineal pain: Secondary | ICD-10-CM | POA: Diagnosis not present

## 2017-09-29 DIAGNOSIS — G8929 Other chronic pain: Secondary | ICD-10-CM | POA: Insufficient documentation

## 2017-09-29 DIAGNOSIS — R109 Unspecified abdominal pain: Secondary | ICD-10-CM

## 2017-09-29 LAB — URINALYSIS, ROUTINE W REFLEX MICROSCOPIC
Bilirubin Urine: NEGATIVE
Glucose, UA: NEGATIVE mg/dL
HGB URINE DIPSTICK: NEGATIVE
KETONES UR: NEGATIVE mg/dL
Nitrite: NEGATIVE
PH: 6 (ref 5.0–8.0)
Protein, ur: NEGATIVE mg/dL
Specific Gravity, Urine: 1.028 (ref 1.005–1.030)

## 2017-09-29 LAB — WET PREP, GENITAL
Clue Cells Wet Prep HPF POC: NONE SEEN
Sperm: NONE SEEN
Trich, Wet Prep: NONE SEEN
Yeast Wet Prep HPF POC: NONE SEEN

## 2017-09-29 MED ORDER — KETOROLAC TROMETHAMINE 60 MG/2ML IM SOLN
60.0000 mg | Freq: Once | INTRAMUSCULAR | Status: DC
Start: 2017-09-29 — End: 2017-09-29
  Filled 2017-09-29: qty 2

## 2017-09-29 MED ORDER — METRONIDAZOLE 500 MG PO TABS: 500.0000 mg | ORAL_TABLET | Freq: Two times a day (BID) | ORAL | 0 refills | Status: DC

## 2017-09-29 MED ORDER — ONDANSETRON 4 MG PO TBDP
4.0000 mg | ORAL_TABLET | Freq: Once | ORAL | Status: AC
  Administered 2017-09-29: 4 mg via ORAL
  Filled 2017-09-29: qty 1

## 2017-09-29 NOTE — MAU Note (Signed)
Pt presents to MAU with complaints nausea, breast pain, back pain and lower abdominal pain since the last week of april

## 2017-09-29 NOTE — MAU Provider Note (Addendum)
Chief Complaint: Nausea; Back Pain; and Breast Pain   First Provider Initiated Contact with Patient 09/29/17 1926    SUBJECTIVE HPI: [name] is a 37 y.o. [id number] not currently pregnant who presents to maternity admissions reporting nausea, back pain and abdominal pain. Abdominal Pain  This is a chronic problem. The current episode started 1 to 4 weeks ago. The problem occurs intermittently. The most recent episode lasted 3 weeks. The problem has been unchanged. The pain is located in the suprapubic region. The pain is at a severity of 6/10. The quality of the pain is cramping. The abdominal pain radiates to the back. Associated symptoms include nausea. Pertinent negatives include no constipation, diarrhea or vomiting. The pain is aggravated by palpation. She has tried nothing for the symptoms.   She denies vaginal bleeding, vaginal itching/burning, urinary symptoms, h/a, dizziness, n/v, or fever/chills.  Patient reports she has Essure for sterilization in place.   Past Medical History:  Diagnosis Date  . Anemia    with pregnancy  . Asthma   . Family history of anesthesia complication    mother stopped breathing and survived   . Fibromyalgia   . Migraine    tension induced   . Stress incontinence    Past Surgical History:  Procedure Laterality Date  . ADENOIDECTOMY    . CYSTOSCOPY N/A 01/09/2014   Procedure: CYSTOSCOPY;  Surgeon: Martina Sinner, MD;  Location: [facility];  Service: Urology;  Laterality: N/A;  . HEEL SPUR SURGERY  nov. 1, 2016   tendon surgery also  . HERNIA REPAIR    . TONSILLECTOMY    . TYMPANOSTOMY TUBE PLACEMENT    . URETHRAL DIVERTICULUM REPAIR N/A 01/09/2014   Procedure: Excision of URETHRAL DIVERTICULUM;  Surgeon: Martina Sinner, MD;  Location: [facility];  Service: Urology;  Laterality: N/A;   Social History   Socioeconomic History  . Marital status: Married    Spouse name: [place]Fayette  . Number of children: 4  . Years of education: college  .  Highest education level: Not on file  Occupational History  . Occupation: Magazine features editor: [employer]  Social Needs  . Financial resource strain: Not on file  . Food insecurity:    Worry: Not on file    Inability: Not on file  . Transportation needs:    Medical: Not on file    Non-medical: Not on file  Tobacco Use  . Smoking status: Never Smoker  . Smokeless tobacco: Never Used  Substance and Sexual Activity  . Alcohol use: No  . Drug use: No  . Sexual activity: Yes    Birth control/protection: None  Lifestyle  . Physical activity:    Days per week: Not on file    Minutes per session: Not on file  . Stress: Not on file  Relationships  . Social connections:    Talks on phone: Not on file    Gets together: Not on file    Attends religious service: Not on file    Active member of club or organization: Not on file    Attends meetings of clubs or organizations: Not on file    Relationship status: Not on file  . Intimate partner violence:    Fear of current or ex partner: Not on file    Emotionally abused: Not on file    Physically abused: Not on file    Forced sexual activity: Not on file  Other Topics Concern  . Not on  file  Social History Narrative   Patient lives at home with her husband Production manager) and 4 children   Patient is left handed   Education some college   Caffeine consumption if any 1 a day   No current facility-administered medications on file prior to encounter.    Current Outpatient Medications on File Prior to Encounter  Medication Sig Dispense Refill  . acetaminophen-codeine (TYLENOL #3) 300-30 MG tablet Take 1 tablet by mouth every 8 (eight) hours as needed for moderate pain. (Patient not taking: Reported on 09/22/2017) 30 tablet 0  . albuterol (PROVENTIL HFA;VENTOLIN HFA) 108 (90 Base) MCG/ACT inhaler Inhale 1-2 puffs into the lungs every 4 (four) hours as needed for wheezing or shortness of breath. For shortness of breath 3.7 g 2  .  albuterol (PROVENTIL) (5 MG/ML) 0.5% nebulizer solution Take 0.5 mLs (2.5 mg total) by nebulization every 6 (six) hours as needed for wheezing or shortness of breath. 20 mL 12  . budesonide-formoterol (SYMBICORT) 160-4.5 MCG/ACT inhaler Inhale 2 puffs into the lungs 2 (two) times daily. 1 Inhaler 6  . cetirizine (ZYRTEC) 10 MG tablet Take 10 mg by mouth daily.    . diclofenac (VOLTAREN) 75 MG EC tablet Take 75 mg by mouth daily.    Marland Kitchen doxycycline (VIBRA-TABS) 100 MG tablet Take 1 tablet (100 mg total) by mouth 2 (two) times daily. (Patient not taking: Reported on 09/22/2017) 14 tablet 0  . famotidine (PEPCID) 20 MG tablet Take 1 tablet (20 mg total) by mouth daily. 14 tablet 0  . Fluticasone-Salmeterol (ADVAIR) 250-50 MCG/DOSE AEPB Inhale 1 puff into the lungs daily.    Marland Kitchen gabapentin (NEURONTIN) 300 MG capsule Take 300 mg by mouth 3 (three) times daily.    . montelukast (SINGULAIR) 10 MG tablet Take 1 tablet (10 mg total) by mouth at bedtime. 30 tablet 0  . tiZANidine (ZANAFLEX) 4 MG tablet Take 4 mg by mouth at bedtime.      Allergies  Allergen Reactions  . Amoxicillin Hives    Has patient had a PCN reaction causing immediate rash, facial/tongue/throat swelling, SOB or lightheadedness with hypotension: Yes Has patient had a PCN reaction causing severe rash involving mucus membranes or skin necrosis: Yes Has patient had a PCN reaction that required hospitalization No Has patient had a PCN reaction occurring within the last 10 years: No If all of the above answers are "NO", then may proceed with Cephalosporin use.   . Contrast Media [Iodinated Diagnostic Agents] Hives and Itching  . Latex Other (See Comments)    Burns and blisters skin  . Penicillins Hives    Has patient had a PCN reaction causing immediate rash, facial/tongue/throat swelling, SOB or lightheadedness with hypotension: Yes Has patient had a PCN reaction causing severe rash involving mucus membranes or skin necrosis: Yes Has  patient had a PCN reaction that required hospitalization No Has patient had a PCN reaction occurring within the last 10 years: No If all of the above answers are "NO", then may proceed with Cephalosporin use.     ROS:  Review of Systems  Constitutional: Negative.   Respiratory: Negative.   Cardiovascular: Negative.   Gastrointestinal: Positive for abdominal pain and nausea. Negative for constipation, diarrhea and vomiting.  Genitourinary: Negative.   Musculoskeletal: Positive for back pain.  Neurological: Negative.    I have reviewed patient's Past Medical Hx, Surgical Hx, Family Hx, Social Hx, medications and allergies.   Physical Exam   Patient Vitals for the past 24 hrs:  BP Temp Pulse Resp  09/29/17 2045 140/90 - 78 -  09/29/17 1905 (!) 150/104 98.3 F (36.8 C) 97 18   Constitutional: Well-developed, morbid obese female in no acute distress.  Cardiovascular: normal rate Respiratory: normal effort GI: Abd soft, non-tender. Pos BS x 4 MS: Extremities nontender, no edema, normal ROM Neurologic: Alert and oriented x 4.  GU: Neg CVAT.  PELVIC EXAM: Cervix pink, visually closed, without lesion, moderate white discharge with strong foul odor, vaginal walls and external genitalia normal Bimanual exam: Cervix 0/long/high, firm, anterior, neg CMT, uterus nontender, nonenlarged, adnexa without tenderness, enlargement, or mass  LAB RESULTS Results for orders placed or performed during the hospital encounter of 09/29/17 (from the past 24 hour(s))  Urinalysis, Routine w reflex microscopic     Status: Abnormal   Collection Time: 09/29/17  6:50 PM  Result Value Ref Range   Color, Urine YELLOW YELLOW   APPearance CLEAR CLEAR   Specific Gravity, Urine 1.028 1.005 - 1.030   pH 6.0 5.0 - 8.0   Glucose, UA NEGATIVE NEGATIVE mg/dL   Hgb urine dipstick NEGATIVE NEGATIVE   Bilirubin Urine NEGATIVE NEGATIVE   Ketones, ur NEGATIVE NEGATIVE mg/dL   Protein, ur NEGATIVE NEGATIVE mg/dL    Nitrite NEGATIVE NEGATIVE   Leukocytes, UA TRACE (A) NEGATIVE   RBC / HPF 0-5 0 - 5 RBC/hpf   WBC, UA 6-10 0 - 5 WBC/hpf   Bacteria, UA RARE (A) NONE SEEN   Squamous Epithelial / LPF 0-5 0 - 5   Mucus PRESENT   Wet prep, genital     Status: Abnormal   Collection Time: 09/29/17  7:30 PM  Result Value Ref Range   Yeast Wet Prep HPF POC NONE SEEN NONE SEEN   Trich, Wet Prep NONE SEEN NONE SEEN   Clue Cells Wet Prep HPF POC NONE SEEN NONE SEEN   WBC, Wet Prep HPF POC FEW (A) NONE SEEN   Sperm NONE SEEN     MAU Management/MDM: Orders Placed This Encounter  Procedures  . Wet prep, genital  . Urinalysis, Routine w reflex microscopic  . Discharge patient Discharge disposition: 01-Home or Self Care; Discharge patient date: 09/29/2017   Wet prep- negative, will treat for BV- positive whiff test with moderate amount of discharge.  GC/C- pending  Urine culture- pending  UPT- negative   Meds ordered this encounter  Medications  . ketorolac (TORADOL) injection 60 mg  . ondansetron (ZOFRAN-ODT) disintegrating tablet 4 mg  . metroNIDAZOLE (FLAGYL) 500 MG tablet    Sig: Take 1 tablet (500 mg total) by mouth 2 (two) times daily.    Dispense:  14 tablet    Refill:  0    Order Specific Question:   Supervising Provider    Answer:   Alysia Penna, MICHAEL L [[id number]]   Treatments in MAU included  Toradol for abdominal and back pain- patient declines medication for pain, Zofran  ODT for nausea- patient did not vomit while in MAU able to drink and eat without being nauseous prior to discharge.    Pt discharged. Pt stable at time of discharge.   ASSESSMENT 1. BV (bacterial vaginosis)   2. Nausea   3. Chronic bilateral low back pain without sciatica   4. Abdominal cramping     PLAN Discharge home Rx for Flagyl sent to pharmacy of choice  Return to urgent care or PCP with worsening symptoms  Follow up as scheduled with PCP for annual exams   Follow-up Information  [facility] Follow up.   Specialty:  Urgent Care Why:  Follow up with urgent care for worsening symptoms  Contact information: [address] [place] [address] [phone]          Allergies as of 09/29/2017      Reactions   Amoxicillin Hives   Has patient had a PCN reaction causing immediate rash, facial/tongue/throat swelling, SOB or lightheadedness with hypotension: Yes Has patient had a PCN reaction causing severe rash involving mucus membranes or skin necrosis: Yes Has patient had a PCN reaction that required hospitalization No Has patient had a PCN reaction occurring within the last 10 years: No If all of the above answers are "NO", then may proceed with Cephalosporin use.   Contrast Media [iodinated Diagnostic Agents] Hives, Itching   Latex Other (See Comments)   Burns and blisters skin   Penicillins Hives   Has patient had a PCN reaction causing immediate rash, facial/tongue/throat swelling, SOB or lightheadedness with hypotension: Yes Has patient had a PCN reaction causing severe rash involving mucus membranes or skin necrosis: Yes Has patient had a PCN reaction that required hospitalization No Has patient had a PCN reaction occurring within the last 10 years: No If all of the above answers are "NO", then may proceed with Cephalosporin use.      Medication List    STOP taking these medications   diclofenac 75 MG EC tablet Commonly known as:  VOLTAREN     TAKE these medications   acetaminophen-codeine 300-30 MG tablet Commonly known as:  TYLENOL #3 Take 1 tablet by mouth every 8 (eight) hours as needed for moderate pain.   albuterol 108 (90 Base) MCG/ACT inhaler Commonly known as:  PROVENTIL HFA;VENTOLIN HFA Inhale 1-2 puffs into the lungs every 4 (four) hours as needed for wheezing or shortness of breath. For shortness of breath   albuterol (5 MG/ML) 0.5% nebulizer solution Commonly known as:  PROVENTIL Take 0.5 mLs (2.5 mg  total) by nebulization every 6 (six) hours as needed for wheezing or shortness of breath.   budesonide-formoterol 160-4.5 MCG/ACT inhaler Commonly known as:  SYMBICORT Inhale 2 puffs into the lungs 2 (two) times daily.   cetirizine 10 MG tablet Commonly known as:  ZYRTEC Take 10 mg by mouth daily.   doxycycline 100 MG tablet Commonly known as:  VIBRA-TABS Take 1 tablet (100 mg total) by mouth 2 (two) times daily.   famotidine 20 MG tablet Commonly known as:  PEPCID Take 1 tablet (20 mg total) by mouth daily.   Fluticasone-Salmeterol 250-50 MCG/DOSE Aepb Commonly known as:  ADVAIR Inhale 1 puff into the lungs daily.   gabapentin 300 MG capsule Commonly known as:  NEURONTIN Take 300 mg by mouth 3 (three) times daily.   metroNIDAZOLE 500 MG tablet Commonly known as:  FLAGYL Take 1 tablet (500 mg total) by mouth 2 (two) times daily.   montelukast 10 MG tablet Commonly known as:  SINGULAIR Take 1 tablet (10 mg total) by mouth at bedtime.   tiZANidine 4 MG tablet Commonly known as:  ZANAFLEX Take 4 mg by mouth at bedtime.      Steward Drone  Certified Nurse-Midwife 09/29/2017  9:33 PM

## 2017-09-29 NOTE — Discharge Instructions (Signed)
In late 2019, the [facility] will be moving to the [place] campus. At that time, the MAU (Maternity Admissions Unit), where you are being seen today, will no longer take care of non-pregnant patients. We strongly encourage you to find a doctor's office before that time, so that you can be seen with any GYN concerns, like vaginal discharge, urinary tract infection, etc.. in a timely manner. ° °In order to make an office visit more convenient, the [facility] will be offering evening hours with same-day appointments, walk-in appointments and scheduled appointments available during this time. ° °[facility] Hours: °Monday - 8am - 7:30 pm with walk-in between 4pm- 7:30 pm °Tuesday - 8 am - 5 pm (starting 08/17/17 we will be open late and accepting walk-ins from 4pm - 7:30pm) °Wednesday - 8 am - 5 pm (starting 11/17/17 we will be open late and accepting walk-ins from 4pm - 7:30pm) °Thursday 8 am - 5 pm (starting 02/17/18 we will be open late and accepting walk-ins from 4pm - 7:30pm) °Friday 8 am - 5 pm ° °For an appointment please call the [facility] at [phone] ° °For urgent needs, [facility] is also available for management of urgent GYN complaints such as vaginal discharge or urinary tract infections. ° ° ° ° ° °

## 2017-09-29 NOTE — Progress Notes (Signed)
Pt refused torodol

## 2017-09-30 LAB — POCT PREGNANCY, URINE
Preg Test, Ur: NEGATIVE
Preg Test, Ur: NEGATIVE

## 2017-09-30 LAB — GC/CHLAMYDIA PROBE AMP (~~LOC~~) NOT AT ARMC
Chlamydia: NEGATIVE
Neisseria Gonorrhea: NEGATIVE

## 2017-10-01 LAB — URINE CULTURE

## 2017-11-05 ENCOUNTER — Other Ambulatory Visit: Payer: Self-pay | Admitting: Surgical Oncology

## 2017-11-05 DIAGNOSIS — K219 Gastro-esophageal reflux disease without esophagitis: Secondary | ICD-10-CM

## 2017-11-12 ENCOUNTER — Other Ambulatory Visit: Payer: BC Managed Care – PPO

## 2017-12-15 ENCOUNTER — Ambulatory Visit
Admission: RE | Admit: 2017-12-15 | Discharge: 2017-12-15 | Disposition: A | Discharge status: Home / Self Care | Payer: BC Managed Care – PPO | Source: Ambulatory Visit | Attending: Surgical Oncology | Admitting: Surgical Oncology

## 2017-12-15 DIAGNOSIS — K219 Gastro-esophageal reflux disease without esophagitis: Secondary | ICD-10-CM

## 2018-02-15 HISTORY — PX: OTHER SURGICAL HISTORY: SHX169

## 2018-03-02 ENCOUNTER — Emergency Department (HOSPITAL_COMMUNITY)
Admission: EM | Admit: 2018-03-02 | Discharge: 2018-03-02 | Disposition: A | Discharge status: Home / Self Care | Payer: BC Managed Care – PPO | Attending: Emergency Medicine | Admitting: Emergency Medicine

## 2018-03-02 ENCOUNTER — Encounter (HOSPITAL_COMMUNITY): Payer: Self-pay

## 2018-03-02 ENCOUNTER — Emergency Department (HOSPITAL_COMMUNITY): Payer: BC Managed Care – PPO

## 2018-03-02 ENCOUNTER — Other Ambulatory Visit: Payer: Self-pay

## 2018-03-02 DIAGNOSIS — Z91040 Latex allergy status: Secondary | ICD-10-CM | POA: Insufficient documentation

## 2018-03-02 DIAGNOSIS — R109 Unspecified abdominal pain: Secondary | ICD-10-CM

## 2018-03-02 DIAGNOSIS — S20211A Contusion of right front wall of thorax, initial encounter: Secondary | ICD-10-CM

## 2018-03-02 DIAGNOSIS — Y999 Unspecified external cause status: Secondary | ICD-10-CM | POA: Diagnosis not present

## 2018-03-02 DIAGNOSIS — Z79899 Other long term (current) drug therapy: Secondary | ICD-10-CM | POA: Insufficient documentation

## 2018-03-02 DIAGNOSIS — Y939 Activity, unspecified: Secondary | ICD-10-CM | POA: Insufficient documentation

## 2018-03-02 DIAGNOSIS — Y929 Unspecified place or not applicable: Secondary | ICD-10-CM | POA: Insufficient documentation

## 2018-03-02 DIAGNOSIS — J4540 Moderate persistent asthma, uncomplicated: Secondary | ICD-10-CM | POA: Insufficient documentation

## 2018-03-02 HISTORY — DX: Sciatica, unspecified side: M54.30

## 2018-03-02 HISTORY — DX: Diaphragmatic hernia without obstruction or gangrene: K44.9

## 2018-03-02 NOTE — ED Notes (Signed)
Upon further assessment, pt complains of right sided chest pain, abd discomfort, bilateral knee pain, and left anterior neck pain.
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
## 2018-03-02 NOTE — ED Notes (Signed)
Patient transported to X-ray 

## 2018-03-02 NOTE — ED Notes (Signed)
Dr Butler at bedside.  

## 2018-04-18 ENCOUNTER — Ambulatory Visit (INDEPENDENT_AMBULATORY_CARE_PROVIDER_SITE_OTHER): Payer: BC Managed Care – PPO | Admitting: Allergy

## 2018-04-18 ENCOUNTER — Encounter: Payer: Self-pay | Admitting: Allergy

## 2018-04-18 VITALS — BP 124/90 | HR 104 | Temp 98.6°F | Resp 22 | Ht 60.5 in | Wt 258.0 lb

## 2018-04-18 DIAGNOSIS — J3089 Other allergic rhinitis: Secondary | ICD-10-CM | POA: Diagnosis not present

## 2018-04-18 DIAGNOSIS — J4540 Moderate persistent asthma, uncomplicated: Secondary | ICD-10-CM

## 2018-04-18 DIAGNOSIS — J302 Other seasonal allergic rhinitis: Secondary | ICD-10-CM

## 2018-04-18 DIAGNOSIS — T781XXD Other adverse food reactions, not elsewhere classified, subsequent encounter: Secondary | ICD-10-CM | POA: Diagnosis not present

## 2018-04-18 DIAGNOSIS — T781XXA Other adverse food reactions, not elsewhere classified, initial encounter: Secondary | ICD-10-CM | POA: Insufficient documentation

## 2018-04-18 DIAGNOSIS — R0602 Shortness of breath: Secondary | ICD-10-CM

## 2018-04-18 DIAGNOSIS — K219 Gastro-esophageal reflux disease without esophagitis: Secondary | ICD-10-CM | POA: Insufficient documentation

## 2018-04-18 MED ORDER — MONTELUKAST SODIUM 10 MG PO TABS: 10.0000 mg | ORAL_TABLET | Freq: Every day | ORAL | 5 refills | Status: DC

## 2018-04-18 MED ORDER — FLUTICASONE-SALMETEROL 250-50 MCG/DOSE IN AEPB: 1.0000 | INHALATION_SPRAY | Freq: Two times a day (BID) | RESPIRATORY_TRACT | 5 refills | Status: AC

## 2018-04-18 MED ORDER — ALBUTEROL SULFATE (5 MG/ML) 0.5% IN NEBU: 2.5000 mg | INHALATION_SOLUTION | Freq: Four times a day (QID) | RESPIRATORY_TRACT | 12 refills | Status: AC | PRN

## 2018-04-18 MED ORDER — ALBUTEROL SULFATE HFA 108 (90 BASE) MCG/ACT IN AERS: 1.0000 | INHALATION_SPRAY | RESPIRATORY_TRACT | 2 refills | Status: DC | PRN

## 2018-04-18 MED ORDER — ALBUTEROL SULFATE HFA 108 (90 BASE) MCG/ACT IN AERS: 1.0000 | INHALATION_SPRAY | RESPIRATORY_TRACT | 2 refills | Status: AC | PRN

## 2018-04-18 NOTE — Assessment & Plan Note (Signed)
Continue to avoid foods that bother you.

## 2018-04-18 NOTE — Assessment & Plan Note (Addendum)
SOB over the last few days. Initially she had some URI symptoms with wheezing but now just feels shortness of breath and fatigued which is not how her usual asthma flares present as. Patient did have recent surgery x 2. No history of blood clots. Patient was advised to come to our clinic to rule out asthma causing her symptoms.   Today's spirometry was normal with no improvement clinically or objectively post bronchodilator treatment.  I do not believe her current episode of SOB is stemming from her asthma. She has tried to use albuterol as well at home with no benefit. Concerned for possible blood clot given her recent history.   Discussed patient with nurse at weight loss clinic after OV and spoke with patient on the phone directing her to go to ER for further evaluation as patient was hesitant about going this direction without discussing it with her surgeon first.

## 2018-04-18 NOTE — Patient Instructions (Addendum)
I do not think you symptoms are due to your asthma. Concern for blood clot - will need CT chest to check for this. Please check with your surgeon as well and let us know.  If you have any worsening of your symptoms please go to the ER immediately.   1. Moderate persistent asthma, uncomplicated - Daily controller medication(s): Advair 250 1 puff twice daily with spacer + Singulair 10mg  daily - Prior to physical activity: ProAir 2 puffs 10-15 minutes before physical activity. - Rescue medications: ProAir 4 puffs every 4-6 hours as needed - Asthma control goals:  * Full participation in all desired activities (may need albuterol before activity) * Albuterol use two time or less a week on average (not counting use with activity) * Cough interfering with sleep two time or less a month * Oral steroids no more than once a year * No hospitalizations  2. Seasonal and perennial allergic rhinitis May use over the counter antihistamines such as Zyrtec (cetirizine), Claritin (loratadine), Allegra (fexofenadine), or Xyzal (levocetirizine) daily as needed.  3. Adverse food reactions Continue to avoid foods that bother you.  4. GERD Continue dexilant and pepcid as per your surgeon  Follow up in 3 months

## 2018-04-18 NOTE — Progress Notes (Signed)
Follow Up Note  RE: [name] MRN: [id number] DOB: 1981-04-22 Date of Office Visit: 04/18/2018  Referring provider: Fleet ContrasAvbuere, Edwin, MD Primary care provider: Fleet ContrasAvbuere, Edwin, MD  Chief Complaint: Asthma (sore throat and cold symptoms last weekend. wheezing and coughing starting this weekend. no fever. has used advair and albuterol with no relief. )  History of Present Illness: I had the pleasure of seeing [name] for a follow up visit at the [facility] on 04/18/2018. She is a 37 y.o. female, who is being followed for asthma, allergic rhinitis and adverse food reaction. Today she is here for new complaint of asthma symptoms and SOB. She is accompanied today by her spouse who provided/contributed to the history. Her previous allergy office visit was on 02/17/2017 with Dr. Dellis AnesGallagher.   1. Moderate persistent asthma, uncomplicated Patient started to have some sore throat last week which resolved and then Friday she had some issues with wheezing and today just feeling very shortness of breath. Denies any wheezing today or coughing. Denies any fevers or chills.  Currently on Advair 250 1 puff BID and has been using albuterol as needed with no benefit. No issues with asthma the last year.   Patient had surgery on October 1st - gastric sleeve with hiatal hernia repair and 2 weeks ago she had her esophagus dilated. She contacted her surgeon regarding this SOB and was advised to come to our office to rule out asthma. Patient states that she has been very worn out and this does not feel like her usual asthma exacerbation.   Denies any history of blood clots and no recent travel.  2. Seasonal and perennial allergic rhinitis Stopped medications after the post surgery. Having some itchy nose.  3. Adverse food reactions Avoiding citrus fruits and breads due to recent surgery and GERD.  Assessment and Plan: [name] is a 37 y.o. female with: SHORTNESS OF BREATH SOB  over the last few days. Initially she had some URI symptoms with wheezing but now just feels shortness of breath and fatigued which is not how her usual asthma flares present as. Patient did have recent surgery x 2. No history of blood clots. Patient was advised to come to our clinic to rule out asthma causing her symptoms.   Today's spirometry was normal with no improvement clinically or objectively post bronchodilator treatment.  I do not believe her current episode of SOB is stemming from her asthma. She has tried to use albuterol as well at home with no benefit. Concerned for possible blood clot given her recent history.   Discussed patient with nurse at weight loss clinic after OV and spoke with patient on the phone directing her to go to ER for further evaluation as patient was hesitant about going this direction without discussing it with her surgeon first.   Moderate persistent asthma, uncomplicated Controlled on Advair 250 1 puff BID.   Continue Advair 250 1 puff BID.  May use albuterol rescue inhaler 2 puffs or nebulizer every 4 to 6 hours as needed for shortness of breath, chest tightness, coughing, and wheezing. May use albuterol rescue inhaler 2 puffs 5 to 15 minutes prior to strenuous physical activities.  Seasonal and perennial allergic rhinitis Stopped medications after surgery. Having some rhinitis symptoms.  Restart Singulair 10mg  daily.  Adverse food reaction Continue to avoid foods that bother you.  Gastroesophageal reflux disease  Continue dexilant and pepcid as per your surgeon  Return in about 3 months (around  07/18/2018).  Meds ordered this encounter  Medications  . montelukast (SINGULAIR) 10 MG tablet    Sig: Take 1 tablet (10 mg total) by mouth at bedtime.    Dispense:  30 tablet    Refill:  5  . DISCONTD: albuterol (PROVENTIL HFA;VENTOLIN HFA) 108 (90 Base) MCG/ACT inhaler    Sig: Inhale 1-2 puffs into the lungs every 4 (four) hours as needed for wheezing  or shortness of breath. For shortness of breath    Dispense:  3.7 g    Refill:  2  . albuterol (PROVENTIL) (5 MG/ML) 0.5% nebulizer solution    Sig: Take 0.5 mLs (2.5 mg total) by nebulization every 6 (six) hours as needed for wheezing or shortness of breath.    Dispense:  20 mL    Refill:  12  . Fluticasone-Salmeterol (ADVAIR) 250-50 MCG/DOSE AEPB    Sig: Inhale 1 puff into the lungs 2 (two) times daily.    Dispense:  60 each    Refill:  5  . albuterol (PROVENTIL HFA;VENTOLIN HFA) 108 (90 Base) MCG/ACT inhaler    Sig: Inhale 1-2 puffs into the lungs every 4 (four) hours as needed for wheezing or shortness of breath. For shortness of breath    Dispense:  3.7 g    Refill:  2   Diagnostics: Spirometry:  Tracings reviewed. Her effort: Good reproducible efforts. FVC: 2.34L FEV1: 1.97L, 83% predicted FEV1/FVC ratio: 84% Interpretation: Spirometry consistent with normal pattern and no improvement in FEV1 post bronchodilator treatment.  Please see scanned spirometry results for details.  Medication List:  Current Outpatient Medications  Medication Sig Dispense Refill  . albuterol (PROVENTIL HFA;VENTOLIN HFA) 108 (90 Base) MCG/ACT inhaler Inhale 1-2 puffs into the lungs every 4 (four) hours as needed for wheezing or shortness of breath. For shortness of breath 3.7 g 2  . albuterol (PROVENTIL) (5 MG/ML) 0.5% nebulizer solution Take 0.5 mLs (2.5 mg total) by nebulization every 6 (six) hours as needed for wheezing or shortness of breath. 20 mL 12  . dexlansoprazole (DEXILANT) 60 MG capsule Take by mouth.    . famotidine (PEPCID) 20 MG tablet Take 1 tablet (20 mg total) by mouth daily. 14 tablet 0  . Fluticasone-Salmeterol (ADVAIR) 250-50 MCG/DOSE AEPB Inhale 1 puff into the lungs 2 (two) times daily. 60 each 5  . ondansetron (ZOFRAN-ODT) 8 MG disintegrating tablet Take by mouth.    Marland Kitchen tiZANidine (ZANAFLEX) 4 MG tablet Take 4 mg by mouth at bedtime.     . montelukast (SINGULAIR) 10 MG tablet  Take 1 tablet (10 mg total) by mouth at bedtime. 30 tablet 5   No current facility-administered medications for this visit.    Allergies: Allergies  Allergen Reactions  . Amoxicillin Hives    Has patient had a PCN reaction causing immediate rash, facial/tongue/throat swelling, SOB or lightheadedness with hypotension: Yes Has patient had a PCN reaction causing severe rash involving mucus membranes or skin necrosis: Yes Has patient had a PCN reaction that required hospitalization No Has patient had a PCN reaction occurring within the last 10 years: No If all of the above answers are "NO", then may proceed with Cephalosporin use.   . Contrast Media [Iodinated Diagnostic Agents] Hives and Itching  . Latex Other (See Comments)    Burns and blisters skin  . Penicillins Hives    Has patient had a PCN reaction causing immediate rash, facial/tongue/throat swelling, SOB or lightheadedness with hypotension: Yes Has patient had a PCN reaction causing severe rash  involving mucus membranes or skin necrosis: Yes Has patient had a PCN reaction that required hospitalization No Has patient had a PCN reaction occurring within the last 10 years: No If all of the above answers are "NO", then may proceed with Cephalosporin use.    I reviewed her past medical history, social history, family history, and environmental history and no significant changes have been reported from previous visit on 02/17/2017.  Review of Systems  Constitutional: Negative for appetite change, chills, fever and unexpected weight change.  HENT: Negative for congestion and rhinorrhea.   Eyes: Negative for itching.  Respiratory: Positive for cough, chest tightness, shortness of breath and wheezing.   Gastrointestinal: Negative for abdominal pain.  Skin: Negative for rash.  Allergic/Immunologic: Positive for environmental allergies.  Neurological: Negative for headaches.   Objective: BP 124/90 (BP Location: Left Arm, Patient  Position: Sitting, Cuff Size: Large)   Pulse (!) 104   Temp 98.6 F (37 C) (Oral)   Resp (!) 22   Ht 5' 0.5" (1.537 m)   Wt 258 lb (117 kg)   SpO2 98%   BMI 49.56 kg/m  Body mass index is 49.56 kg/m. Physical Exam  Constitutional: She is oriented to person, place, and time. She appears well-developed and well-nourished.  HENT:  Head: Normocephalic and atraumatic.  Right Ear: External ear normal.  Left Ear: External ear normal.  Nose: Nose normal.  Mouth/Throat: Oropharynx is clear and moist.  Eyes: Conjunctivae and EOM are normal.  Neck: Neck supple.  Cardiovascular: Normal rate, regular rhythm and normal heart sounds. Exam reveals no gallop and no friction rub.  No murmur heard. Pulmonary/Chest: Effort normal and breath sounds normal. She has no wheezes. She has no rales.  Lymphadenopathy:    She has no cervical adenopathy.  Neurological: She is alert and oriented to person, place, and time.  Skin: Skin is warm. No rash noted.  Psychiatric: She has a normal mood and affect. Her behavior is normal.  Nursing note and vitals reviewed.  Previous notes and tests were reviewed. The plan was reviewed with the patient/family, and all questions/concerned were addressed.  It was my pleasure to see Sibel today and participate in her care. Please feel free to contact me with any questions or concerns.  Sincerely,  Wyline Mood, DO Allergy & Immunology  [facility] office: [phone] [facility] office:[phone]

## 2018-04-18 NOTE — Assessment & Plan Note (Signed)
Stopped medications after surgery. Having some rhinitis symptoms.  Restart Singulair 10mg  daily.

## 2018-04-18 NOTE — Assessment & Plan Note (Addendum)
Controlled on Advair 250 1 puff BID.   Continue Advair 250 1 puff BID.  May use albuterol rescue inhaler 2 puffs or nebulizer every 4 to 6 hours as needed for shortness of breath, chest tightness, coughing, and wheezing. May use albuterol rescue inhaler 2 puffs 5 to 15 minutes prior to strenuous physical activities.

## 2018-04-18 NOTE — Assessment & Plan Note (Signed)
   Continue dexilant and pepcid as per your surgeon

## 2018-04-19 ENCOUNTER — Emergency Department (HOSPITAL_COMMUNITY): Payer: BC Managed Care – PPO

## 2018-04-19 ENCOUNTER — Emergency Department (HOSPITAL_COMMUNITY)
Admission: EM | Admit: 2018-04-19 | Discharge: 2018-04-19 | Disposition: A | Discharge status: Home / Self Care | Payer: BC Managed Care – PPO | Attending: Emergency Medicine | Admitting: Emergency Medicine

## 2018-04-19 ENCOUNTER — Other Ambulatory Visit: Payer: Self-pay

## 2018-04-19 ENCOUNTER — Encounter (HOSPITAL_COMMUNITY): Payer: Self-pay

## 2018-04-19 DIAGNOSIS — R072 Precordial pain: Secondary | ICD-10-CM | POA: Diagnosis not present

## 2018-04-19 DIAGNOSIS — Z79899 Other long term (current) drug therapy: Secondary | ICD-10-CM | POA: Insufficient documentation

## 2018-04-19 DIAGNOSIS — Z91040 Latex allergy status: Secondary | ICD-10-CM | POA: Diagnosis not present

## 2018-04-19 DIAGNOSIS — R0602 Shortness of breath: Secondary | ICD-10-CM | POA: Diagnosis present

## 2018-04-19 DIAGNOSIS — J45909 Unspecified asthma, uncomplicated: Secondary | ICD-10-CM | POA: Diagnosis not present

## 2018-04-19 DIAGNOSIS — E876 Hypokalemia: Secondary | ICD-10-CM | POA: Diagnosis not present

## 2018-04-19 LAB — I-STAT TROPONIN, ED
TROPONIN I, POC: 0 ng/mL (ref 0.00–0.08)
Troponin i, poc: 0.01 ng/mL (ref 0.00–0.08)

## 2018-04-19 LAB — HEPATIC FUNCTION PANEL
ALT: 15 U/L (ref 0–44)
AST: 18 U/L (ref 15–41)
Albumin: 3.5 g/dL (ref 3.5–5.0)
Alkaline Phosphatase: 56 U/L (ref 38–126)
Bilirubin, Direct: 0.2 mg/dL (ref 0.0–0.2)
Indirect Bilirubin: 0.4 mg/dL (ref 0.3–0.9)
Total Bilirubin: 0.6 mg/dL (ref 0.3–1.2)
Total Protein: 6.7 g/dL (ref 6.5–8.1)

## 2018-04-19 LAB — BASIC METABOLIC PANEL
Anion gap: 13 (ref 5–15)
BUN: 8 mg/dL (ref 6–20)
CO2: 23 mmol/L (ref 22–32)
CREATININE: 0.8 mg/dL (ref 0.44–1.00)
Calcium: 9.3 mg/dL (ref 8.9–10.3)
Chloride: 102 mmol/L (ref 98–111)
GFR calc Af Amer: >60 mL/min (ref >60)
Glucose, Bld: 99 mg/dL (ref 70–99)
Potassium: 3 mmol/L — ABNORMAL LOW (ref 3.5–5.1)
SODIUM: 138 mmol/L (ref 135–145)

## 2018-04-19 LAB — I-STAT BETA HCG BLOOD, ED (MC, WL, AP ONLY): I-stat hCG, quantitative: <5 m[IU]/mL (ref <5)

## 2018-04-19 LAB — CBC
HEMATOCRIT: 41.4 % (ref 36.0–46.0)
Hemoglobin: 12.5 g/dL (ref 12.0–15.0)
MCH: 25 pg — AB (ref 26.0–34.0)
MCHC: 30.2 g/dL (ref 30.0–36.0)
MCV: 82.8 fL (ref 80.0–100.0)
Platelets: 254 10*3/uL (ref 150–400)
RBC: 5 MIL/uL (ref 3.87–5.11)
RDW: 16.1 % — AB (ref 11.5–15.5)
WBC: 5.5 10*3/uL (ref 4.0–10.5)
nRBC: 0 % (ref 0.0–0.2)

## 2018-04-19 LAB — D-DIMER, QUANTITATIVE (NOT AT ARMC): D DIMER QUANT: 0.49 ug{FEU}/mL (ref 0.00–0.50)

## 2018-04-19 LAB — LIPASE, BLOOD: Lipase: 25 U/L (ref 11–51)

## 2018-04-19 MED ORDER — OXYCODONE-ACETAMINOPHEN 5-325 MG PO TABS: 1.0000 | ORAL_TABLET | ORAL | 0 refills | Status: DC | PRN

## 2018-04-19 MED ORDER — POTASSIUM CHLORIDE CRYS ER 20 MEQ PO TBCR
40.0000 meq | EXTENDED_RELEASE_TABLET | Freq: Once | ORAL | Status: AC
  Administered 2018-04-19: 40 meq via ORAL
  Filled 2018-04-19: qty 2

## 2018-04-19 NOTE — Discharge Instructions (Signed)
Your work-up today was overall reassuring.  Your chest x-ray did not show evidence of significant abnormality and specifically there was no pneumonia.  Your cardiac enzymes were negative twice and your test for blood clot was negative.  Based on this, we suspect is more of a muscular skeletal type pain in the setting of your reflux and recent surgery.  As you reports the pain medicine helps you postoperatively, we will give you prescription for pain medication.  We also found you had low potassium which we supplemented.  Please follow-up with your PCP and surgeon and if any symptoms change or worsen, please return to the nearest emergency department.
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
## 2018-04-19 NOTE — ED Provider Notes (Signed)
[facility] EMERGENCY DEPARTMENT Provider Note   CSN: [id number] Arrival date & time: 04/19/18  4098     History   Chief Complaint Chief Complaint  Patient presents with  . Post-op Problem    HPI [name] is a 37 y.o. female.  The history is provided by the patient and medical records. No language interpreter was used.  Shortness of Breath  This is a new problem. The average episode lasts 3 days. The problem occurs continuously.The current episode started more than 2 days ago. The problem has not changed since onset.Associated symptoms include rhinorrhea, cough, sputum production and wheezing. Pertinent negatives include no fever, no headaches, no sore throat, no swollen glands, no neck pain, no hemoptysis, no chest pain, no syncope, no vomiting, no abdominal pain, no rash, no leg swelling and no claudication. It is unknown what precipitated the problem. She has tried nothing for the symptoms. The treatment provided no relief. Associated medical issues include asthma and recent surgery.    Past Medical History:  Diagnosis Date  . Anemia    with pregnancy  . Asthma   . Family history of anesthesia complication    mother stopped breathing and survived   . Fibromyalgia   . Hiatal hernia   . Migraine    tension induced   . Sciatica   . Stress incontinence     Patient Active Problem List   Diagnosis Date Noted  . Adverse food reaction 04/18/2018  . Gastroesophageal reflux disease 04/18/2018  . Moderate persistent asthma, uncomplicated 02/17/2017  . Seasonal and perennial allergic rhinitis 02/17/2017  . Morbid obesity with body mass index of 45.0-49.9 in adult [facility]) 07/18/2015  . 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 ORTHOSTATIC HYPOTENSION 05/23/2009  . SYNCOPE 05/23/2009  . MIGRAINE HEADACHE 05/22/2009  . Asthma 05/22/2009  . UTI 05/22/2009  . PALPITATIONS 05/22/2009  . SHORTNESS OF BREATH 05/22/2009    Past Surgical History:  Procedure  Laterality Date  . ADENOIDECTOMY    . CYSTOSCOPY N/A 01/09/2014   Procedure: CYSTOSCOPY;  Surgeon: Martina Sinner, MD;  Location: [facility];  Service: Urology;  Laterality: N/A;  . HEEL SPUR SURGERY  nov. 1, 2016   tendon surgery also  . HERNIA REPAIR    . LAPAROSCOPIC GASTRIC SLEEVE RESECTION WITH HIATAL HERNIA REPAIR    . TONSILLECTOMY    . TYMPANOSTOMY TUBE PLACEMENT    . URETHRAL DIVERTICULUM REPAIR N/A 01/09/2014   Procedure: Excision of URETHRAL DIVERTICULUM;  Surgeon: Martina Sinner, MD;  Location: [facility];  Service: Urology;  Laterality: N/A;  . weight loss sleeve  02/15/2018     OB History    Gravida  5   Para  4   Term  4   Preterm      AB  1   Living  4     SAB      TAB      Ectopic  1   Multiple      Live Births  4            Home Medications    Prior to Admission medications   Medication Sig Start Date End Date Taking? Authorizing Provider  albuterol (PROVENTIL HFA;VENTOLIN HFA) 108 (90 Base) MCG/ACT inhaler Inhale 1-2 puffs into the lungs every 4 (four) hours as needed for wheezing or shortness of breath. For shortness of breath 04/18/18   Ellamae Sia, DO  albuterol (PROVENTIL) (5 MG/ML) 0.5% nebulizer solution Take 0.5 mLs (2.5  mg total) by nebulization every 6 (six) hours as needed for wheezing or shortness of breath. 04/18/18   Ellamae Sia, DO  dexlansoprazole (DEXILANT) 60 MG capsule Take by mouth. 04/18/18   [provider]  famotidine (PEPCID) 20 MG tablet Take 1 tablet (20 mg total) by mouth daily. 09/22/17   Caccavale, Sophia, PA-C  Fluticasone-Salmeterol (ADVAIR) 250-50 MCG/DOSE AEPB Inhale 1 puff into the lungs 2 (two) times daily. 04/18/18   Ellamae Sia, DO  montelukast (SINGULAIR) 10 MG tablet Take 1 tablet (10 mg total) by mouth at bedtime. 04/18/18   Ellamae Sia, DO  ondansetron (ZOFRAN-ODT) 8 MG disintegrating tablet Take by mouth. 03/14/18   [provider]  tiZANidine (ZANAFLEX) 4 MG tablet Take 4 mg by mouth at  bedtime.     [provider]    Family History Family History  Problem Relation Age of Onset  . Heart disease Mother   . Asthma Mother   . COPD Mother   . Stroke Father   . Seizures Father   . Heart disease Maternal Grandmother     Social History Social History   Tobacco Use  . Smoking status: Never Smoker  . Smokeless tobacco: Never Used  Substance Use Topics  . Alcohol use: No  . Drug use: No     Allergies   Amoxicillin; Contrast media [iodinated diagnostic agents]; Latex; and Penicillins   Review of Systems Review of Systems  Constitutional: Negative for chills, diaphoresis, fatigue and fever.  HENT: Positive for rhinorrhea. Negative for congestion and sore throat.   Eyes: Negative for visual disturbance.  Respiratory: Positive for cough, sputum production, chest tightness, shortness of breath and wheezing. Negative for hemoptysis, choking and stridor.   Cardiovascular: Negative for chest pain, palpitations, claudication, leg swelling and syncope.  Gastrointestinal: Positive for nausea. Negative for abdominal pain, constipation, diarrhea and vomiting.  Genitourinary: Negative for dysuria, flank pain and frequency.  Musculoskeletal: Negative for back pain, neck pain and neck stiffness.  Skin: Negative for rash and wound.  Neurological: Negative for light-headedness, numbness and headaches.  Psychiatric/Behavioral: Negative for agitation.  All other systems reviewed and are negative.    Physical Exam Updated Vital Signs BP (!) 140/93 (BP Location: Right Arm)   Pulse (!) 102   Temp 98.2 F (36.8 C) (Oral)   Resp 16   LMP 03/18/2018   SpO2 100%   Physical Exam  Constitutional: She appears well-developed and well-nourished. No distress.  HENT:  Head: Normocephalic and atraumatic.  Nose: Nose normal.  Mouth/Throat: Oropharynx is clear and moist. No oropharyngeal exudate.  Eyes: Pupils are equal, round, and reactive to light. Conjunctivae and EOM  are normal.  Neck: Normal range of motion. Neck supple.  Cardiovascular: Normal rate, regular rhythm and intact distal pulses.  No murmur heard. Pulmonary/Chest: Effort normal and breath sounds normal. No stridor. No respiratory distress. She has no wheezes. She has no rales. She exhibits no tenderness.  Abdominal: Soft. She exhibits no distension. There is no tenderness.  Musculoskeletal: She exhibits no edema or tenderness.  Lymphadenopathy:    She has no cervical adenopathy.  Neurological: She is alert.  Skin: Skin is warm and dry. Capillary refill takes less than 2 seconds. No rash noted. She is not diaphoretic. No erythema.  Psychiatric: She has a normal mood and affect.  Nursing note and vitals reviewed.    ED Treatments / Results  Labs (all labs ordered are listed, but only abnormal results are displayed) Labs Reviewed  BASIC METABOLIC PANEL - Abnormal; Notable for the following components:      Result Value   Potassium 3.0 (*)    All other components within normal limits  CBC - Abnormal; Notable for the following components:   MCH 25.0 (*)    RDW 16.1 (*)    All other components within normal limits  D-DIMER, QUANTITATIVE (NOT AT [facility])  HEPATIC FUNCTION PANEL  LIPASE, BLOOD  I-STAT TROPONIN, ED  I-STAT BETA HCG BLOOD, ED ([facility], [facility], [facility] ONLY)  I-STAT TROPONIN, ED    EKG EKG Interpretation  Date/Time:  Tuesday April 19 2018 10:11:18 EST Ventricular Rate:  93 PR Interval:  154 QRS Duration: 78 QT Interval:  374 QTC Calculation: 465 R Axis:   6 Text Interpretation:  Normal sinus rhythm Normal ECG When compared to prior, no significant cahnges seen.  No STEMI Confirmed by Theda Belfastegeler, Chris ([id number]) on 04/19/2018 10:56:33 AM   Radiology Dg Chest 2 View  Result Date: 04/19/2018 CLINICAL DATA:  Short of breath, cough, intermittent sharp chest pains today EXAM: CHEST - 2 VIEW COMPARISON:  Chest x-ray of 03/02/2018 FINDINGS: No active infiltrate or effusion is seen.  Mediastinal and hilar contours are unremarkable. The heart is within normal limits in size. No bony abnormality is seen. Surgical clips are noted in the region of the stomach which may indicate prior gastric bypass surgery. IMPRESSION: No active cardiopulmonary disease. Electronically Signed   By: Dwyane DeePaul  Barry M.D.   On: 04/19/2018 10:47    Procedures Procedures (including critical care time)  Medications Ordered in ED Medications  potassium chloride SA (K-DUR,KLOR-CON) CR tablet 40 mEq (40 mEq Oral Given 04/19/18 1527)     Initial Impression / Assessment and Plan / ED Course  I have reviewed the triage vital signs and the nursing notes.  Pertinent labs & imaging results that were available during my care of the patient were reviewed by me and considered in my medical decision making (see chart for details).     [name] is a 37 y.o. female with a past medical history significant for asthma, hiatal hernia status post laparoscopic gastric sleeve resection and hiatal hernia repair in October, and recent esophageal dilation several weeks ago who presents at the direction of her pulmonologist for evaluation to rule out pneumonia or pulmonary embolism as cause of shortness of breath.  Patient reports that over the last several days she has had shortness of breath.  She reports that it was wheezing however that improved.  She reports she still having intermittent shortness of breath.  She denies chest pain does but does report some chest tightness.  She reports a cough with some phlegm.  She denies fevers or chills.  She denies any vomiting but reports mild nausea.  She denies any conservation, diarrhea, urinary symptoms leg pain or leg swelling.  No history of DVT or PE.  She denies recent trauma.  She denies throat pain headache, or other complaints.  On exam, patient has no significant wheezing.  There is no chest tenderness.  No neck tenderness.  No stridor.  No crepitance in the neck or  upper chest.  Abdomen is nontender.  Legs are nonedematous.  Patient resting comfortably on room air with normal option saturation.    EKG shows no STEMI.  Had discussion with patient for further management.  Have low suspicion for complication with her gastric sleeve surgery as she is having no abdominal pain, GI symptoms aside from mild nausea.  With her  shortness of breath that has been persistent despite wheezing improving, we considered pulmonary embolism.  As patient is allergic contrast, a shared decision-making conversation was held and we decided to use a d-dimer initially.  Patient also will have chest x-ray, troponin with delta troponin.  Patient thinks it is likely reflux and asthma exacerbation in the setting of URI.  Initial laboratory testing show negative troponin.  Patient is not pregnant.  CBC shows no leukocytosis or anemia.  BMP is normal kidney function mild hypokalemia, oral potassium supplementation will be ordered.  Chest x-ray unremarkable with no evidence of subcutaneous gas pneumothorax or other comp occasions such as pneumonia.    Patient awaiting other labs.  If labs are reassuring, anticipate discharge home for outpatient follow-up.  Patient's troponin was negative x2.  D-dimer was negative.  Hepatic function and lipase were normal.  Chest x-ray showed no significant abnormality and no sub-cutaneous gas present.  Given reassuring work-up, and the fact the patient is feeling better, we feel safe the patient going home.  She thinks the pain feels somewhat similar to postoperative pain.  She reports that oxycodone helped her significantly when she used them.  Patient will give prescription for several doses of pain medication and will be given oral potassium here.  Based on her symptoms, have low suspicion for esophageal injury, aortic injury, or any problem in the postoperative area.  Patient will follow-up with her PCP and surgeon.  Patient understood return precautions  for any new or worsened symptoms.  Patient was discharged in good condition with improved symptoms.   Final Clinical Impressions(s) / ED Diagnoses   Final diagnoses:  Precordial pain  Hypokalemia    ED Discharge Orders         Ordered    oxyCODONE-acetaminophen (PERCOCET/ROXICET) 5-325 MG tablet  Every 4 hours PRN     04/19/18 1520         Clinical Impression: 1. Precordial pain   2. Hypokalemia     Disposition: Discharge  Condition: Good  I have discussed the results, Dx and Tx plan with the pt(& family if present). He/she/they expressed understanding and agree(s) with the plan. Discharge instructions discussed at great length. Strict return precautions discussed and pt &/or family have verbalized understanding of the instructions. No further questions at time of discharge.    New Prescriptions   OXYCODONE-ACETAMINOPHEN (PERCOCET/ROXICET) 5-325 MG TABLET    Take 1 tablet by mouth every 4 (four) hours as needed for severe pain.    Follow Up: Fleet Contras, MD [address] [place] [address] [phone]     [facility] EMERGENCY DEPARTMENT [address] [phone] mc [place] [address] [phone]       , Canary Brim, MD 04/19/18 1536

## 2018-05-17 DIAGNOSIS — K219 Gastro-esophageal reflux disease without esophagitis: Secondary | ICD-10-CM | POA: Insufficient documentation

## 2018-05-17 DIAGNOSIS — Z9884 Bariatric surgery status: Secondary | ICD-10-CM | POA: Insufficient documentation

## 2018-07-18 ENCOUNTER — Ambulatory Visit: Payer: BC Managed Care – PPO | Admitting: Allergy

## 2018-08-04 DIAGNOSIS — K56699 Other intestinal obstruction unspecified as to partial versus complete obstruction: Secondary | ICD-10-CM | POA: Insufficient documentation

## 2019-12-14 DIAGNOSIS — N62 Hypertrophy of breast: Secondary | ICD-10-CM | POA: Insufficient documentation

## 2020-08-23 DIAGNOSIS — N926 Irregular menstruation, unspecified: Secondary | ICD-10-CM | POA: Insufficient documentation

## 2021-12-17 ENCOUNTER — Ambulatory Visit
Admission: EM | Admit: 2021-12-17 | Discharge: 2021-12-17 | Disposition: A | Discharge status: Home / Self Care | Payer: Commercial Managed Care - PPO | Attending: Internal Medicine | Admitting: Internal Medicine

## 2021-12-17 DIAGNOSIS — J069 Acute upper respiratory infection, unspecified: Secondary | ICD-10-CM

## 2021-12-17 NOTE — ED Provider Notes (Signed)
[facility]    CSN: [id number] Arrival date & time: 12/17/21  5374      History   Chief Complaint Chief Complaint  Patient presents with   Cough    HPI [name] is a 41 y.o. female.   Patient presents with nasal congestion, cough, sore throat, body aches, chills that started about 3 days ago.  Her son and spouse have similar symptoms.  Denies any known fevers at home but states that she "felt feverish".  She has taken Tylenol Cold and flu with some improvement.  Denies chest pain, shortness of breath, ear pain, nausea, vomiting, diarrhea, abdominal pain.   Cough   Past Medical History:  Diagnosis Date   Anemia    with pregnancy   Asthma    Family history of anesthesia complication    mother stopped breathing and survived    Fibromyalgia    Hiatal hernia    Migraine    tension induced    Sciatica    Stress incontinence     Patient Active Problem List   Diagnosis Date Noted   Adverse food reaction 04/18/2018   Gastroesophageal reflux disease 04/18/2018   Moderate persistent asthma, uncomplicated 02/17/2017   Seasonal and perennial allergic rhinitis 02/17/2017   Morbid obesity with body mass index of 45.0-49.9 in adult [facility]) 07/18/2015   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   ORTHOSTATIC HYPOTENSION 05/23/2009   SYNCOPE 05/23/2009   MIGRAINE HEADACHE 05/22/2009   Asthma 05/22/2009   UTI 05/22/2009   PALPITATIONS 05/22/2009   SHORTNESS OF BREATH 05/22/2009    Past Surgical History:  Procedure Laterality Date   ADENOIDECTOMY     CYSTOSCOPY N/A 01/09/2014   Procedure: CYSTOSCOPY;  Surgeon: Martina Sinner, MD;  Location: [facility];  Service: Urology;  Laterality: N/A;   HEEL SPUR SURGERY  nov. 1, 2016   tendon surgery also   HERNIA REPAIR     LAPAROSCOPIC GASTRIC SLEEVE RESECTION WITH HIATAL HERNIA REPAIR     TONSILLECTOMY     TYMPANOSTOMY TUBE PLACEMENT     URETHRAL DIVERTICULUM REPAIR N/A 01/09/2014   Procedure: Excision of URETHRAL  DIVERTICULUM;  Surgeon: Martina Sinner, MD;  Location: [facility];  Service: Urology;  Laterality: N/A;   weight loss sleeve  02/15/2018    OB History     Gravida  5   Para  4   Term  4   Preterm      AB  1   Living  4      SAB      IAB      Ectopic  1   Multiple      Live Births  4            Home Medications    Prior to Admission medications   Medication Sig Start Date End Date Taking? Authorizing Provider  albuterol (PROVENTIL HFA;VENTOLIN HFA) 108 (90 Base) MCG/ACT inhaler Inhale 1-2 puffs into the lungs every 4 (four) hours as needed for wheezing or shortness of breath. For shortness of breath 04/18/18   Ellamae Sia, DO  albuterol (PROVENTIL) (5 MG/ML) 0.5% nebulizer solution Take 0.5 mLs (2.5 mg total) by nebulization every 6 (six) hours as needed for wheezing or shortness of breath. 04/18/18   Ellamae Sia, DO  dexlansoprazole (DEXILANT) 60 MG capsule Take 60 mg by mouth daily.  04/18/18   [provider]  famotidine (PEPCID) 20 MG tablet Take 1 tablet (20 mg total) by mouth daily. Patient  taking differently: Take 20 mg by mouth at bedtime.  09/22/17   Caccavale, Sophia, PA-C  Fluticasone-Salmeterol (ADVAIR) 250-50 MCG/DOSE AEPB Inhale 1 puff into the lungs 2 (two) times daily. 04/18/18   Ellamae Sia, DO  montelukast (SINGULAIR) 10 MG tablet Take 1 tablet (10 mg total) by mouth at bedtime. 04/18/18   Ellamae Sia, DO  omeprazole (PRILOSEC) 40 MG capsule Take 40 mg by mouth 2 (two) times daily.    [provider]  ondansetron (ZOFRAN-ODT) 8 MG disintegrating tablet Take 8 mg by mouth every 8 (eight) hours as needed.  03/14/18   [provider]  oxyCODONE-acetaminophen (PERCOCET/ROXICET) 5-325 MG tablet Take 1 tablet by mouth every 4 (four) hours as needed for severe pain. 04/19/18   Tegeler, Canary Brim, MD  tiZANidine (ZANAFLEX) 4 MG tablet Take 4 mg by mouth at bedtime.     [provider]  ursodiol (ACTIGALL) 250 MG tablet Take  250 mg by mouth 2 (two) times daily.    [provider]    Family History Family History  Problem Relation Age of Onset   Heart disease Mother    Asthma Mother    COPD Mother    Stroke Father    Seizures Father    Heart disease Maternal Grandmother     Social History Social History   Tobacco Use   Smoking status: Never   Smokeless tobacco: Never  Substance Use Topics   Alcohol use: No   Drug use: No     Allergies   Amoxicillin, Contrast media [iodinated contrast media], Latex, and Penicillins   Review of Systems Review of Systems Per HPI  Physical Exam Triage Vital Signs ED Triage Vitals  Enc Vitals Group     BP 12/17/21 0854 112/76     Pulse Rate 12/17/21 0854 60     Resp 12/17/21 0854 18     Temp 12/17/21 0854 98 F (36.7 C)     Temp Source 12/17/21 0854 Oral     SpO2 12/17/21 0854 98 %     Weight --      Height --      Head Circumference --      Peak Flow --      Pain Score 12/17/21 0852 0     Pain Loc --      Pain Edu? --      Excl. in GC? --    No data found.  Updated Vital Signs BP 112/76 (BP Location: Left Arm)   Pulse 60   Temp 98 F (36.7 C) (Oral)   Resp 18   SpO2 98%   Visual Acuity Right Eye Distance:   Left Eye Distance:   Bilateral Distance:    Right Eye Near:   Left Eye Near:    Bilateral Near:     Physical Exam Constitutional:      General: She is not in acute distress.    Appearance: Normal appearance. She is not toxic-appearing or diaphoretic.  HENT:     Head: Normocephalic and atraumatic.     Right Ear: Tympanic membrane and ear canal normal.     Left Ear: Tympanic membrane and ear canal normal.     Nose: Congestion present.     Mouth/Throat:     Mouth: Mucous membranes are moist.     Pharynx: No posterior oropharyngeal erythema.  Eyes:     Extraocular Movements: Extraocular movements intact.     Conjunctiva/sclera: Conjunctivae normal.     Pupils:  Pupils are equal, round, and reactive to light.   Cardiovascular:     Rate and Rhythm: Normal rate and regular rhythm.     Pulses: Normal pulses.     Heart sounds: Normal heart sounds.  Pulmonary:     Effort: Pulmonary effort is normal. No respiratory distress.     Breath sounds: Normal breath sounds. No stridor. No wheezing, rhonchi or rales.  Abdominal:     General: Abdomen is flat. Bowel sounds are normal.     Palpations: Abdomen is soft.  Musculoskeletal:        General: Normal range of motion.     Cervical back: Normal range of motion.  Skin:    General: Skin is warm and dry.  Neurological:     General: No focal deficit present.     Mental Status: She is alert and oriented to person, place, and time. Mental status is at baseline.  Psychiatric:        Mood and Affect: Mood normal.        Behavior: Behavior normal.      UC Treatments / Results  Labs (all labs ordered are listed, but only abnormal results are displayed) Labs Reviewed  COVID-19, FLU A+B NAA    EKG   Radiology No results found.  Procedures Procedures (including critical care time)  Medications Ordered in UC Medications - No data to display  Initial Impression / Assessment and Plan / UC Course  I have reviewed the triage vital signs and the nursing notes.  Pertinent labs & imaging results that were available during my care of the patient were reviewed by me and considered in my medical decision making (see chart for details).     Patient presents with symptoms likely from a viral upper respiratory infection. Differential includes bacterial pneumonia, sinusitis, allergic rhinitis, COVID-19, flu. Do not suspect underlying cardiopulmonary process. Symptoms seem unlikely related to ACS, CHF or COPD exacerbations, pneumonia, pneumothorax. Patient is nontoxic appearing and not in need of emergent medical intervention.  Do not think strep testing is necessary given appearance of throat on exam.  COVID and flu test pending.  Recommended symptom control  with over the counter medications: Daily oral anti-histamine, Oral decongestant or IN corticosteroid, saline irrigations, cepacol lozenges, Robitussin, Delsym, honey tea.  Return if symptoms fail to improve in 1-2 weeks or you develop shortness of breath, chest pain, severe headache. Patient states understanding and is agreeable.  Discharged with PCP followup.  Final Clinical Impressions(s) / UC Diagnoses   Final diagnoses:  Viral upper respiratory tract infection with cough     Discharge Instructions      It appears that you have a viral upper respiratory infection that should run its course and self resolve with symptomatic treatment.  Please use symptomatic treatment as we discussed.  Follow-up if symptoms persist or worsen.    ED Prescriptions   None    PDMP not reviewed this encounter.   Gustavus Bryant, [place] 12/17/21 [phone]

## 2021-12-17 NOTE — ED Triage Notes (Signed)
Pt c/o body aches and chills, fatigue, sore throat, nasal drainage, onset ~ monday

## 2021-12-17 NOTE — Discharge Instructions (Signed)
It appears that you have a viral upper respiratory infection that should run its course and self resolve with symptomatic treatment.  Please use symptomatic treatment as we discussed.  Follow-up if symptoms persist or worsen.

## 2021-12-18 LAB — COVID-19, FLU A+B NAA
Influenza A, NAA: NOT DETECTED
Influenza B, NAA: NOT DETECTED
SARS-CoV-2, NAA: NOT DETECTED

## 2022-02-20 ENCOUNTER — Ambulatory Visit
Admission: EM | Admit: 2022-02-20 | Discharge: 2022-02-20 | Disposition: A | Discharge status: Home / Self Care | Payer: BC Managed Care – PPO | Attending: Internal Medicine | Admitting: Internal Medicine

## 2022-02-20 ENCOUNTER — Encounter: Payer: Self-pay | Admitting: Emergency Medicine

## 2022-02-20 DIAGNOSIS — J4521 Mild intermittent asthma with (acute) exacerbation: Secondary | ICD-10-CM | POA: Diagnosis not present

## 2022-02-20 MED ORDER — IPRATROPIUM-ALBUTEROL 0.5-2.5 (3) MG/3ML IN SOLN
3.0000 mL | Freq: Once | RESPIRATORY_TRACT | Status: AC
  Administered 2022-02-20: 3 mL via RESPIRATORY_TRACT

## 2022-02-20 MED ORDER — PREDNISONE 20 MG PO TABS: 40.0000 mg | ORAL_TABLET | Freq: Every day | ORAL | 0 refills | Status: AC

## 2022-02-20 MED ORDER — AZITHROMYCIN 250 MG PO TABS: ORAL_TABLET | ORAL | 0 refills | Status: AC

## 2022-02-20 MED ORDER — NEBULIZER SYSTEM ALL-IN-ONE MISC: 1.0000 | Freq: Four times a day (QID) | 0 refills | Status: AC | PRN

## 2022-02-20 MED ORDER — METHYLPREDNISOLONE SODIUM SUCC 125 MG IJ SOLR
80.0000 mg | Freq: Once | INTRAMUSCULAR | Status: AC
  Administered 2022-02-20: 80 mg via INTRAMUSCULAR

## 2022-02-20 MED ORDER — ALBUTEROL SULFATE (2.5 MG/3ML) 0.083% IN NEBU: 2.5000 mg | INHALATION_SOLUTION | Freq: Four times a day (QID) | RESPIRATORY_TRACT | 12 refills | Status: AC | PRN

## 2022-02-20 NOTE — Discharge Instructions (Addendum)
I have prescribed you an antibiotic that you can start taking today.  I have also prescribed you prednisone which you will start taking tomorrow.  Please take this medication with food.  Albuterol nebulizer treatment has also been prescribed for you. Please be advised that your inhaler and nebulizer are the same medication to use at least 4 to 6 hours apart if in the same day.

## 2022-02-20 NOTE — ED Provider Notes (Signed)
[facility]    CSN: [id number] Arrival date & time: 02/20/22  1639      History   Chief Complaint Chief Complaint  Patient presents with   Asthma   Shortness of Breath    HPI [name] is a 41 y.o. female.   Patient presents with concerns for asthma exacerbation.  Patient reports that she started having shortness of breath and mild nonproductive cough today.  She has been using her albuterol inhaler with minimal improvement.  Denies any associated upper respiratory symptoms, fever, known sick contacts.  She reports that has been "a while" since asthma exacerbation.  Denies chest pain. She also states that she takes Advair daily.  Patient also reports that she had nasal congestion, itchy eyes, sinus pressure about a week prior that has seemed to improve.  Patient reports that she is left with frontal and bilateral headache.  She reports that she does have a history of migraines as well and feels very similar and is also not sure if it is related to recent upper respiratory infection.   Asthma  Shortness of Breath   Past Medical History:  Diagnosis Date   Anemia    with pregnancy   Asthma    Family history of anesthesia complication    mother stopped breathing and survived    Fibromyalgia    Hiatal hernia    Migraine    tension induced    Sciatica    Stress incontinence     Patient Active Problem List   Diagnosis Date Noted   Adverse food reaction 04/18/2018   Gastroesophageal reflux disease 04/18/2018   Moderate persistent asthma, uncomplicated [id number]   Seasonal and perennial allergic rhinitis 02/17/2017   Morbid obesity with body mass index of 45.0-49.9 in adult [facility]) 07/18/2015   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 1, 2016   tendon surgery also   HERNIA REPAIR     LAPAROSCOPIC GASTRIC SLEEVE RESECTION WITH HIATAL HERNIA REPAIR     TONSILLECTOMY     TYMPANOSTOMY TUBE PLACEMENT     URETHRAL DIVERTICULUM REPAIR N/A 01/09/2014   Procedure: Excision of URETHRAL DIVERTICULUM;  Surgeon: Reece Packer, MD;  Location: [facility];  Service: Urology;  Laterality: N/A;   weight loss sleeve  02/15/2018    OB History     Gravida  5   Para  4   Term  4   Preterm      AB  1   Living  4      SAB      IAB      Ectopic  1   Multiple      Live Births  4            Home Medications    Prior to Admission medications   Medication Sig Start Date End Date Taking? Authorizing Provider  albuterol (PROVENTIL) (2.5 MG/3ML) 0.083% nebulizer solution Take 3 mLs (2.5 mg total) by nebulization every 6 (six) hours as needed for wheezing or shortness of breath. 02/20/22  Yes  Naomia Lenderman, [place] E, FNP  azithromycin (ZITHROMAX Z-PAK) 250 MG tablet Take 2 tablets (500 mg total) by mouth daily for 1 day, THEN 1 tablet (250 mg total) daily for 4 days. 02/20/22 02/25/22 Yes Leslye Puccini, Michele Rockers, FNP  Nebulizer System All-In-One MISC 1 Device by Does not apply route every 6 (six) hours as needed. 02/20/22  Yes Misheel Gowans, Hildred Alamin E, FNP  predniSONE (DELTASONE) 20 MG tablet Take 2 tablets (40 mg total) by mouth daily for 5 days. 02/20/22 02/25/22 Yes Chanel Mcadams, Michele Rockers, FNP  albuterol (PROVENTIL HFA;VENTOLIN HFA) 108 (90 Base) MCG/ACT inhaler Inhale 1-2 puffs into the lungs every 4 (four) hours as needed for wheezing or shortness of breath. For shortness of breath 04/18/18   Garnet Sierras, DO  albuterol (PROVENTIL) (5 MG/ML) 0.5% nebulizer solution Take 0.5 mLs (2.5 mg total) by nebulization every 6 (six) hours as needed for wheezing  or shortness of breath. 04/18/18   Garnet Sierras, DO  dexlansoprazole (DEXILANT) 60 MG capsule Take 60 mg by mouth daily.  04/18/18   [provider]  famotidine (PEPCID) 20 MG tablet Take 1 tablet (20 mg total) by mouth daily. Patient taking differently: Take 20 mg by mouth at bedtime.  09/22/17   Caccavale, Sophia, PA-C  Fluticasone-Salmeterol (ADVAIR) 250-50 MCG/DOSE AEPB Inhale 1 puff into the lungs 2 (two) times daily. 04/18/18   Garnet Sierras, DO  montelukast (SINGULAIR) 10 MG tablet Take 1 tablet (10 mg total) by mouth at bedtime. 04/18/18   Garnet Sierras, DO  omeprazole (PRILOSEC) 40 MG capsule Take 40 mg by mouth 2 (two) times daily.    [provider]  ondansetron (ZOFRAN-ODT) 8 MG disintegrating tablet Take 8 mg by mouth every 8 (eight) hours as needed.  03/14/18   [provider]  oxyCODONE-acetaminophen (PERCOCET/ROXICET) 5-325 MG tablet Take 1 tablet by mouth every 4 (four) hours as needed for severe pain. 04/19/18   Tegeler, Gwenyth Allegra, MD  tiZANidine (ZANAFLEX) 4 MG tablet Take 4 mg by mouth at bedtime.     [provider]  ursodiol (ACTIGALL) 250 MG tablet Take 250 mg by mouth 2 (two) times daily.    [provider]    Family History Family History  Problem Relation Age of Onset   Heart disease Mother    Asthma Mother    COPD Mother    Stroke Father    Seizures Father    Heart disease Maternal Grandmother     Social History Social History   Tobacco Use   Smoking status: Never   Smokeless tobacco: Never  Substance Use Topics   Alcohol use: No   Drug use: No     Allergies   Amoxicillin, Contrast media [iodinated contrast media], Latex, Other, and Penicillins   Review of Systems Review of Systems Per HPI  Physical Exam Triage Vital Signs ED Triage Vitals  Enc Vitals Group     BP 02/20/22 1653 130/83     Pulse Rate 02/20/22 1653 83     Resp 02/20/22 1653 19     Temp 02/20/22 1653 98.1 F (36.7 C)     Temp src --       SpO2 02/20/22 1653 97 %     Weight --      Height --      Head Circumference --      Peak Flow --      Pain Score 02/20/22 1652 0     Pain Loc --  Pain Edu? --      Excl. in [place]? --    No data found.  Updated Vital Signs BP 130/83   Pulse 83   Temp 98.1 F (36.7 C)   Resp 19   SpO2 97%   Visual Acuity Right Eye Distance:   Left Eye Distance:   Bilateral Distance:    Right Eye Near:   Left Eye Near:    Bilateral Near:     Physical Exam Constitutional:      General: She is not in acute distress.    Appearance: Normal appearance. She is not toxic-appearing or diaphoretic.  HENT:     Head: Normocephalic and atraumatic.     Right Ear: Tympanic membrane and ear canal normal.     Left Ear: Tympanic membrane and ear canal normal.     Nose: Congestion present.     Mouth/Throat:     Mouth: Mucous membranes are moist.     Pharynx: No posterior oropharyngeal erythema.  Eyes:     Extraocular Movements: Extraocular movements intact.     Conjunctiva/sclera: Conjunctivae normal.     Pupils: Pupils are equal, round, and reactive to light.  Cardiovascular:     Rate and Rhythm: Normal rate and regular rhythm.     Pulses: Normal pulses.     Heart sounds: Normal heart sounds.  Pulmonary:     Effort: Pulmonary effort is normal. No respiratory distress.     Breath sounds: No stridor. Wheezing present. No rhonchi or rales.     Comments: Very mild tachypnea noted. Abdominal:     General: Abdomen is flat. Bowel sounds are normal.     Palpations: Abdomen is soft.  Musculoskeletal:        General: Normal range of motion.     Cervical back: Normal range of motion.  Skin:    General: Skin is warm and dry.  Neurological:     General: No focal deficit present.     Mental Status: She is alert and oriented to person, place, and time. Mental status is at baseline.     Cranial Nerves: Cranial nerves 2-12 are intact.     Sensory: Sensation is intact.     Motor: Motor function is  intact.     Coordination: Coordination is intact.     Gait: Gait is intact.  Psychiatric:        Mood and Affect: Mood normal.        Behavior: Behavior normal.      UC Treatments / Results  Labs (all labs ordered are listed, but only abnormal results are displayed) Labs Reviewed - No data to display  EKG   Radiology No results found.  Procedures Procedures (including critical care time)  Medications Ordered in UC Medications  ipratropium-albuterol (DUONEB) 0.5-2.5 (3) MG/3ML nebulizer solution 3 mL (3 mLs Nebulization Given 02/20/22 1659)  methylPREDNISolone sodium succinate (SOLU-MEDROL) 125 mg/2 mL injection 80 mg (80 mg Intramuscular Given 02/20/22 1718)    Initial Impression / Assessment and Plan / UC Course  I have reviewed the triage vital signs and the nursing notes.  Pertinent labs & imaging results that were available during my care of the patient were reviewed by me and considered in my medical decision making (see chart for details).     Patient had mild tachypnea with shortness of breath noted on original physical exam.  DuoNeb and IM Solu-Medrol was administered with improvement in patient's symptoms and no more tachypnea.  Vital signs and oxygen  are stable.  Patient also states that she felt better.  Do think that patient is having an asthma exacerbation that is most likely related to acute upper respiratory infection that she has recently had.  Prescribed prednisone as well.  Patient advised to start taking this tomorrow given that she received IM steroids today in urgent care.  Patient does have history of gastric surgery but has taken prednisone since having surgery and tolerated well.  Advised patient to take this with food to avoid stomach upset.  I am suspicious that patient's headache is due to acute sinus infection as well so will prescribe azithromycin antibiotic given penicillin allergy to alleviate this.  Neuro exam is normal and no trauma to the head so  do not think that emergent evaluation or imaging of the head is necessary.  It should also help alleviate migraine.  Patient reported that she felt like the nebulizer medication was working better than her inhaler.  Therefore, will prescribe nebulizer medication and nebulizer machine order was completed for patient to take to pharmacy to get nebulizer machine as we do not have any nebulizer machines to give out here in urgent care.  Patient was also educated that albuterol inhaler and nebulizer are the same medication and to use at least 4 to 6 hours apart if in the same day.  Patient was advised to go to the emergency department if any symptoms persist or worsen.  Patient verbalized understanding and was agreeable with plan.  Final diagnoses:  Mild intermittent asthma with acute exacerbation     Discharge Instructions      I have prescribed you an antibiotic that you can start taking today.  I have also prescribed you prednisone which you will start taking tomorrow.  Please take this medication with food.  Albuterol nebulizer treatment has also been prescribed for you. Please be advised that your inhaler and nebulizer are the same medication to use at least 4 to 6 hours apart if in the same day.     ED Prescriptions     Medication Sig Dispense Auth. Provider   predniSONE (DELTASONE) 20 MG tablet Take 2 tablets (40 mg total) by mouth daily for 5 days. 10 tablet [place], [place] E, [place]   azithromycin (ZITHROMAX Z-PAK) 250 MG tablet Take 2 tablets (500 mg total) by mouth daily for 1 day, THEN 1 tablet (250 mg total) daily for 4 days. 6 tablet [place], [place] E, [place]   albuterol (PROVENTIL) (2.5 MG/3ML) 0.083% nebulizer solution Take 3 mLs (2.5 mg total) by nebulization every 6 (six) hours as needed for wheezing or shortness of breath. 75 mL Oswaldo Conroy E, FNP   Nebulizer System All-In-One MISC 1 Device by Does not apply route every 6 (six) hours as needed. 1 each Teodora Medici, [place]      PDMP not  reviewed this encounter.   Teodora Medici, [place] 02/20/22 1759

## 2022-02-20 NOTE — ED Triage Notes (Addendum)
Pt is present today with c/o SOB and asthma flare. Pt states she used her inhaler around 2:30pm. Pt states that she had some relief but not much.   Pt states that she also has a sinus HA. Pt states that her sx started today

## 2022-04-14 ENCOUNTER — Ambulatory Visit: Payer: BC Managed Care – PPO | Admitting: Allergy & Immunology

## 2022-07-06 DIAGNOSIS — E079 Disorder of thyroid, unspecified: Secondary | ICD-10-CM | POA: Insufficient documentation

## 2022-10-22 ENCOUNTER — Ambulatory Visit: Payer: Self-pay | Admitting: Allergy & Immunology

## 2022-12-10 ENCOUNTER — Encounter (HOSPITAL_BASED_OUTPATIENT_CLINIC_OR_DEPARTMENT_OTHER): Payer: Self-pay | Admitting: Emergency Medicine

## 2022-12-10 ENCOUNTER — Other Ambulatory Visit: Payer: Self-pay

## 2022-12-10 ENCOUNTER — Emergency Department (HOSPITAL_BASED_OUTPATIENT_CLINIC_OR_DEPARTMENT_OTHER): Payer: BC Managed Care – PPO

## 2022-12-10 ENCOUNTER — Emergency Department (HOSPITAL_BASED_OUTPATIENT_CLINIC_OR_DEPARTMENT_OTHER)
Admission: EM | Admit: 2022-12-10 | Discharge: 2022-12-10 | Disposition: A | Discharge status: Home / Self Care | Payer: BC Managed Care – PPO | Attending: Emergency Medicine | Admitting: Emergency Medicine

## 2022-12-10 DIAGNOSIS — R519 Headache, unspecified: Secondary | ICD-10-CM | POA: Diagnosis present

## 2022-12-10 DIAGNOSIS — G43109 Migraine with aura, not intractable, without status migrainosus: Secondary | ICD-10-CM | POA: Diagnosis not present

## 2022-12-10 LAB — BASIC METABOLIC PANEL
Anion gap: 8 (ref 5–15)
BUN: 16 mg/dL (ref 6–20)
CO2: 25 mmol/L (ref 22–32)
Calcium: 8.8 mg/dL — ABNORMAL LOW (ref 8.9–10.3)
Chloride: 104 mmol/L (ref 98–111)
Creatinine, Ser: 0.71 mg/dL (ref 0.44–1.00)
GFR, Estimated: >60 mL/min (ref >60)
Glucose, Bld: 90 mg/dL (ref 70–99)
Potassium: 4.3 mmol/L (ref 3.5–5.1)
Sodium: 137 mmol/L (ref 135–145)

## 2022-12-10 LAB — CBC WITH DIFFERENTIAL/PLATELET
Abs Immature Granulocytes: 0.01 10*3/uL (ref 0.00–0.07)
Basophils Absolute: 0 10*3/uL (ref 0.0–0.1)
Basophils Relative: 0 %
Eosinophils Absolute: 0.1 10*3/uL (ref 0.0–0.5)
Eosinophils Relative: 2 %
HCT: 37.5 % (ref 36.0–46.0)
Hemoglobin: 12.1 g/dL (ref 12.0–15.0)
Immature Granulocytes: 0 %
Lymphocytes Relative: 30 %
Lymphs Abs: 1.5 10*3/uL (ref 0.7–4.0)
MCH: 27.3 pg (ref 26.0–34.0)
MCHC: 32.3 g/dL (ref 30.0–36.0)
MCV: 84.7 fL (ref 80.0–100.0)
Monocytes Absolute: 0.4 10*3/uL (ref 0.1–1.0)
Monocytes Relative: 7 %
Neutro Abs: 3 10*3/uL (ref 1.7–7.7)
Neutrophils Relative %: 61 %
Platelets: 265 10*3/uL (ref 150–400)
RBC: 4.43 MIL/uL (ref 3.87–5.11)
RDW: 13.7 % (ref 11.5–15.5)
WBC: 5 10*3/uL (ref 4.0–10.5)
nRBC: 0 % (ref 0.0–0.2)

## 2022-12-10 MED ORDER — KETOROLAC TROMETHAMINE 15 MG/ML IJ SOLN
15.0000 mg | Freq: Once | INTRAMUSCULAR | Status: AC
  Administered 2022-12-10: 15 mg via INTRAVENOUS
  Filled 2022-12-10: qty 1

## 2022-12-10 MED ORDER — IBUPROFEN 600 MG PO TABS: 600.0000 mg | ORAL_TABLET | Freq: Four times a day (QID) | ORAL | 0 refills | Status: DC | PRN

## 2022-12-10 MED ORDER — IOHEXOL 350 MG/ML SOLN
100.0000 mL | Freq: Once | INTRAVENOUS | Status: AC | PRN
  Administered 2022-12-10: 60 mL via INTRAVENOUS

## 2022-12-10 MED ORDER — DIPHENHYDRAMINE HCL 50 MG/ML IJ SOLN
50.0000 mg | Freq: Once | INTRAMUSCULAR | Status: AC
  Administered 2022-12-10: 50 mg via INTRAVENOUS

## 2022-12-10 MED ORDER — DIPHENHYDRAMINE HCL 50 MG/ML IJ SOLN
25.0000 mg | Freq: Once | INTRAMUSCULAR | Status: DC
  Filled 2022-12-10: qty 1

## 2022-12-10 MED ORDER — ACETAMINOPHEN 500 MG PO TABS: 500.0000 mg | ORAL_TABLET | Freq: Four times a day (QID) | ORAL | 0 refills | Status: DC | PRN

## 2022-12-10 MED ORDER — PROCHLORPERAZINE EDISYLATE 10 MG/2ML IJ SOLN
10.0000 mg | Freq: Once | INTRAMUSCULAR | Status: AC
Start: 2022-12-10 — End: 2022-12-10
  Administered 2022-12-10: 10 mg via INTRAVENOUS
  Filled 2022-12-10: qty 2

## 2022-12-10 NOTE — ED Notes (Signed)
Patient verbalizes understanding of discharge instructions. Opportunity for questioning and answers were provided. Patient discharged from ED. IV removed x1. VSS

## 2022-12-10 NOTE — ED Triage Notes (Signed)
Pt presents to ED POV. Pt c/o HA since Sunday. Pt reports that it has migrated back and forth from the left to right. C/o nausea and photophobia. Pt reports hx of migraines in past but not for a wile and these are different

## 2022-12-10 NOTE — ED Notes (Signed)
Patient transported to CT 

## 2022-12-10 NOTE — ED Notes (Signed)
Pt reports migraine has subsides, resting comfortably at this time

## 2022-12-10 NOTE — ED Provider Notes (Signed)
Patient signed out to me at 1530 by Dr. Rhunette Croft pending CTV and reassessment.  In short this is a 42 year old female with a past medical history of asthma and fibromyalgia presenting to the emergency department with migraine type headache with associated nausea and photophobia.  She reported a history of migraines but this felt different from usual.  She is on birth control pills concerning for possible venous thrombosis and head CT be ordered.  She was treated with migraine cocktail including Toradol, Compazine, Benadryl.  5:42 PM -on reassessment, patient CT head without acute abnormality.  The patient reports that her headache has resolved.  She is stable for discharge home and will be given outpatient neurology follow-up and resume strict return precautions.   Rexford Maus, DO 12/10/22 1745

## 2022-12-10 NOTE — ED Provider Notes (Signed)
[place] EMERGENCY DEPARTMENT AT [facility] Provider Note   CSN: [id number] Arrival date & time: 12/10/22  1228     History  Chief Complaint  Patient presents with   Migraine    [name] is a 42 y.o. female.  HPI    42 year old female comes in with chief complaint of migraine.  Patient has history of fibromyalgia. Patient indicates that she has been having headache for the last 5 days or so.  Headaches are constant.  Initially the headaches were right-sided, then they moved left side and now they are back on the right side.  Headaches are described as throbbing, sharp and there is also burning sensation.  She also has associated nausea and photophobia, phonophobia.  Patient has had headaches in the past, but these headaches are different.  She has never been diagnosed with migraine headache.  Patient also states that the headaches are worse in the morning. Home Medications Prior to Admission medications   Medication Sig Start Date End Date Taking? Authorizing Provider  acetaminophen (TYLENOL) 500 MG tablet Take 1 tablet (500 mg total) by mouth every 6 (six) hours as needed. 12/10/22  Yes Derwood Kaplan, MD  ibuprofen (ADVIL) 600 MG tablet Take 1 tablet (600 mg total) by mouth every 6 (six) hours as needed. 12/10/22  Yes Derwood Kaplan, MD  albuterol (PROVENTIL HFA;VENTOLIN HFA) 108 (90 Base) MCG/ACT inhaler Inhale 1-2 puffs into the lungs every 4 (four) hours as needed for wheezing or shortness of breath. For shortness of breath 04/18/18   Ellamae Sia, DO  albuterol (PROVENTIL) (2.5 MG/3ML) 0.083% nebulizer solution Take 3 mLs (2.5 mg total) by nebulization every 6 (six) hours as needed for wheezing or shortness of breath. 02/20/22   Gustavus Bryant, FNP  albuterol (PROVENTIL) (5 MG/ML) 0.5% nebulizer solution Take 0.5 mLs (2.5 mg total) by nebulization every 6 (six) hours as needed for wheezing or shortness of breath. 04/18/18   Ellamae Sia, DO  dexlansoprazole  (DEXILANT) 60 MG capsule Take 60 mg by mouth daily.  04/18/18   [provider]  famotidine (PEPCID) 20 MG tablet Take 1 tablet (20 mg total) by mouth daily. Patient taking differently: Take 20 mg by mouth at bedtime.  09/22/17   Caccavale, Sophia, PA-C  Fluticasone-Salmeterol (ADVAIR) 250-50 MCG/DOSE AEPB Inhale 1 puff into the lungs 2 (two) times daily. 04/18/18   Ellamae Sia, DO  montelukast (SINGULAIR) 10 MG tablet Take 1 tablet (10 mg total) by mouth at bedtime. 04/18/18   Ellamae Sia, DO  Nebulizer System All-In-One MISC 1 Device by Does not apply route every 6 (six) hours as needed. 02/20/22   Gustavus Bryant, FNP  omeprazole (PRILOSEC) 40 MG capsule Take 40 mg by mouth 2 (two) times daily.    [provider]  ondansetron (ZOFRAN-ODT) 8 MG disintegrating tablet Take 8 mg by mouth every 8 (eight) hours as needed.  03/14/18   [provider]  tiZANidine (ZANAFLEX) 4 MG tablet Take 4 mg by mouth at bedtime.     [provider]  ursodiol (ACTIGALL) 250 MG tablet Take 250 mg by mouth 2 (two) times daily.    [provider]      Allergies    Amoxicillin, Contrast media [iodinated contrast media], Latex, Other, and Penicillins    Review of Systems   Review of Systems  All other systems reviewed and are negative.   Physical Exam Updated Vital Signs BP 135/86   Pulse 71  Temp 97.8 F (36.6 C)   Resp 18   SpO2 100%  Physical Exam Vitals and nursing note reviewed.  Constitutional:      Appearance: She is well-developed.  HENT:     Head: Atraumatic.  Eyes:     Extraocular Movements: Extraocular movements intact.     Pupils: Pupils are equal, round, and reactive to light.  Cardiovascular:     Rate and Rhythm: Normal rate.  Pulmonary:     Effort: Pulmonary effort is normal.  Musculoskeletal:     Cervical back: Normal range of motion and neck supple.  Skin:    General: Skin is warm and dry.  Neurological:     Mental Status: She is alert and  oriented to person, place, and time.     Cranial Nerves: No cranial nerve deficit.     Sensory: No sensory deficit.     Motor: No weakness.     Coordination: Coordination normal.     ED Results / Procedures / Treatments   Labs (all labs ordered are listed, but only abnormal results are displayed) Labs Reviewed  BASIC METABOLIC PANEL - Abnormal; Notable for the following components:      Result Value   Calcium 8.8 (*)    All other components within normal limits  CBC WITH DIFFERENTIAL/PLATELET    EKG None  Radiology No results found.  Procedures Procedures    Medications Ordered in ED Medications  ketorolac (TORADOL) 15 MG/ML injection 15 mg (15 mg Intravenous Given 12/10/22 1437)  prochlorperazine (COMPAZINE) injection 10 mg (10 mg Intravenous Given 12/10/22 1437)  diphenhydrAMINE (BENADRYL) injection 50 mg (50 mg Intravenous Given 12/10/22 1441)  iohexol (OMNIPAQUE) 350 MG/ML injection 100 mL (75 mLs Intravenous Contrast Given 12/10/22 1551)    ED Course/ Medical Decision Making/ A&P                             Medical Decision Making Amount and/or Complexity of Data Reviewed Labs: ordered. Radiology: ordered.  Risk OTC drugs. Prescription drug management.   Plan 42 year old patient with history of fibromyalgia comes in with chief complaint of headache.  Headaches have some migrainous component.  She is also on OCPs.  Headaches have been constant now, worse in the morning.  Differential diagnosis considered for this patient includes:  Primary headaches - including migrainous headaches, cluster headaches, tension headaches. ICH Carotid dissection Cavernous sinus thrombosis Meningitis Encephalitis Sinusitis Tumor Vascular headaches AV malformation Brain aneurysm Muscular headaches  Plan is to start patient on headache cocktail.  Low suspicion for secondary headache that is life-threatening or emergent at this time given the duration of the symptoms and  lack of neurologic deficits.  Headache was also not thunderclap in nature.  We will get CT venogram.  If negative patient can be discharged.  Headache cocktail also prescribed.  Final Clinical Impression(s) / ED Diagnoses Final diagnoses:  Migraine equivalent    Rx / DC Orders ED Discharge Orders          Ordered    Ambulatory referral to Neurology       Comments: An appointment is requested in approximately: 1 week   12/10/22 1554    ibuprofen (ADVIL) 600 MG tablet  Every 6 hours PRN        12/10/22 1554    acetaminophen (TYLENOL) 500 MG tablet  Every 6 hours PRN        12/10/22 1554  Derwood Kaplan, MD 12/11/22 [phone]

## 2022-12-10 NOTE — Discharge Instructions (Addendum)
We saw you in the ER for headaches. All the labs and imaging are normal. We are not sure what is causing your headaches, however, there appears to be no evidence of infection, bleeds or tumors based on our exam and results.  Please take motrin round the clock for the next 48 hours, and take other meds prescribed only for break through pain. We have placed a neurologist referral so that they can help you further with your headache disorder.

## 2022-12-25 ENCOUNTER — Telehealth: Payer: BC Managed Care – PPO | Admitting: Physician Assistant

## 2022-12-25 DIAGNOSIS — R3989 Other symptoms and signs involving the genitourinary system: Secondary | ICD-10-CM | POA: Diagnosis not present

## 2022-12-25 MED ORDER — NITROFURANTOIN MONOHYD MACRO 100 MG PO CAPS: 100.0000 mg | ORAL_CAPSULE | Freq: Two times a day (BID) | ORAL | 0 refills | Status: DC

## 2022-12-25 NOTE — Patient Instructions (Signed)
Melinda Nash Nordin, thank you for joining [name], PA-C for today's virtual visit.  While this provider is not your primary care provider (PCP), if your PCP is located in our provider database this encounter information will be shared with them immediately following your visit.   A [place] MyChart account gives you access to today's visit and all your visits, tests, and labs performed at [facility] " click here if you don't have a [facility] MyChart account or go to [email or web address]  Consent: (Patient) [name] provided verbal consent for this virtual visit at the beginning of the encounter.  Current Medications:  Current Outpatient Medications:    nitrofurantoin, macrocrystal-monohydrate, (MACROBID) 100 MG capsule, Take 1 capsule (100 mg total) by mouth 2 (two) times daily., Disp: 10 capsule, Rfl: 0   acetaminophen (TYLENOL) 500 MG tablet, Take 1 tablet (500 mg total) by mouth every 6 (six) hours as needed., Disp: 30 tablet, Rfl: 0   albuterol (PROVENTIL HFA;VENTOLIN HFA) 108 (90 Base) MCG/ACT inhaler, Inhale 1-2 puffs into the lungs every 4 (four) hours as needed for wheezing or shortness of breath. For shortness of breath, Disp: 3.7 g, Rfl: 2   albuterol (PROVENTIL) (2.5 MG/3ML) 0.083% nebulizer solution, Take 3 mLs (2.5 mg total) by nebulization every 6 (six) hours as needed for wheezing or shortness of breath., Disp: 75 mL, Rfl: 12   albuterol (PROVENTIL) (5 MG/ML) 0.5% nebulizer solution, Take 0.5 mLs (2.5 mg total) by nebulization every 6 (six) hours as needed for wheezing or shortness of breath., Disp: 20 mL, Rfl: 12   dexlansoprazole (DEXILANT) 60 MG capsule, Take 60 mg by mouth daily. , Disp: , Rfl:    famotidine (PEPCID) 20 MG tablet, Take 1 tablet (20 mg total) by mouth daily. (Patient taking differently: Take 20 mg by mouth at bedtime. ), Disp: 14 tablet, Rfl: 0   Fluticasone-Salmeterol (ADVAIR) 250-50 MCG/DOSE AEPB, Inhale 1 puff into  the lungs 2 (two) times daily., Disp: 60 each, Rfl: 5   ibuprofen (ADVIL) 600 MG tablet, Take 1 tablet (600 mg total) by mouth every 6 (six) hours as needed., Disp: 30 tablet, Rfl: 0   montelukast (SINGULAIR) 10 MG tablet, Take 1 tablet (10 mg total) by mouth at bedtime., Disp: 30 tablet, Rfl: 5   Nebulizer System All-In-One MISC, 1 Device by Does not apply route every 6 (six) hours as needed., Disp: 1 each, Rfl: 0   omeprazole (PRILOSEC) 40 MG capsule, Take 40 mg by mouth 2 (two) times daily., Disp: , Rfl:    ondansetron (ZOFRAN-ODT) 8 MG disintegrating tablet, Take 8 mg by mouth every 8 (eight) hours as needed. , Disp: , Rfl:    tiZANidine (ZANAFLEX) 4 MG tablet, Take 4 mg by mouth at bedtime. , Disp: , Rfl:    ursodiol (ACTIGALL) 250 MG tablet, Take 250 mg by mouth 2 (two) times daily., Disp: , Rfl:    Medications ordered in this encounter:  Meds ordered this encounter  Medications   nitrofurantoin, macrocrystal-monohydrate, (MACROBID) 100 MG capsule    Sig: Take 1 capsule (100 mg total) by mouth 2 (two) times daily.    Dispense:  10 capsule    Refill:  0    Order Specific Question:   Supervising Provider    Answer:   Merrilee Jansky [id number]     *If you need refills on other medications prior to your next appointment, please contact your pharmacy*  Follow-Up: Call back or seek an in-person evaluation if  the symptoms worsen or if the condition fails to improve as anticipated.  [facility] [phone]  Other Instructions Vaginal Probiotics: AZO vaginal probiotic OLLY Happy Hoo-Ha RAW Vaginal Care RenewLife Women's vaginal probiotic RepHresh Pro-B  Vaginal washes: Honey Pot Summer's Eve Vagisil Feminine cleanser    If you have been instructed to have an in-person evaluation today at a local Urgent Care facility, please use the link below. It will take you to a list of all of our available [facility], including address, phone number and hours  of operation. Please do not delay care.  [facility]  If you or a family member do not have a primary care provider, use the link below to schedule a visit and establish care. When you choose a [facility] primary care physician or advanced practice provider, you gain a long-term partner in health. Find a Primary Care Provider  Learn more about [facility] in-office and virtual care options: [facility] - Get Care Now

## 2022-12-25 NOTE — Progress Notes (Signed)
Virtual Visit Consent   [name], you are scheduled for a virtual visit with a [facility] provider today. Just as with appointments in the office, your consent must be obtained to participate. Your consent will be active for this visit and any virtual visit you may have with one of our providers in the next 365 days. If you have a MyChart account, a copy of this consent can be sent to you electronically.  As this is a virtual visit, video technology does not allow for your provider to perform a traditional examination. This may limit your provider's ability to fully assess your condition. If your provider identifies any concerns that need to be evaluated in person or the need to arrange testing (such as labs, EKG, etc.), we will make arrangements to do so. Although advances in technology are sophisticated, we cannot ensure that it will always work on either your end or our end. If the connection with a video visit is poor, the visit may have to be switched to a telephone visit. With either a video or telephone visit, we are not always able to ensure that we have a secure connection.  By engaging in this virtual visit, you consent to the provision of healthcare and authorize for your insurance to be billed (if applicable) for the services provided during this visit. Depending on your insurance coverage, you may receive a charge related to this service.  I need to obtain your verbal consent now. Are you willing to proceed with your visit today? [name] has provided verbal consent on 12/25/2022 for a virtual visit (video or telephone). Melinda Loveless, PA-C  Date: 12/25/2022 8:35 AM  Virtual Visit via Video Note   I, [name], connected with  [name]  ([id number], 1980/05/30) on 12/25/22 at  8:30 AM EDT by a video-enabled telemedicine application and verified that I am speaking with the correct person using two identifiers.  Location: Patient: Virtual Visit  Location Patient: Home Provider: Virtual Visit Location Provider: Home Office   I discussed the limitations of evaluation and management by telemedicine and the availability of in person appointments. The patient expressed understanding and agreed to proceed.    History of Present Illness: [name] is a 42 y.o. who identifies as a female who was assigned female at birth, and is being seen today for possible UTI.  HPI: Urinary Tract Infection  This is a new problem. The current episode started in the past 7 days (Tuesday or wednesday). The problem occurs every urination. The problem has been unchanged. The quality of the pain is described as burning. The pain is mild. There has been no fever. Associated symptoms include frequency, hesitancy and urgency. Pertinent negatives include no chills, discharge, flank pain, hematuria, nausea or vomiting. Associated symptoms comments: Strong urine odor, cloudy urine. The treatment provided no relief. Her past medical history is significant for recurrent UTIs.     Problems:  Patient Active Problem List   Diagnosis Date Noted   Adverse food reaction 04/18/2018   Gastroesophageal reflux disease 04/18/2018   Moderate persistent asthma, uncomplicated 02/17/2017   Seasonal and perennial allergic rhinitis 02/17/2017   Morbid obesity with body mass index of 45.0-49.9 in adult [facility]) 07/18/2015   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   ORTHOSTATIC HYPOTENSION 05/23/2009   SYNCOPE 05/23/2009   MIGRAINE HEADACHE 05/22/2009   Asthma 05/22/2009   UTI 05/22/2009   PALPITATIONS 05/22/2009   SHORTNESS OF BREATH 05/22/2009    Allergies:  Allergies  Allergen Reactions   Amoxicillin Hives    Has patient had a PCN reaction causing immediate rash, facial/tongue/throat swelling, SOB or lightheadedness with hypotension: Yes Has patient had a PCN reaction causing severe rash involving mucus membranes or skin necrosis: Yes Has patient had a PCN reaction that  required hospitalization No Has patient had a PCN reaction occurring within the last 10 years: No If all of the above answers are "NO", then may proceed with Cephalosporin use.    Contrast Media [Iodinated Contrast Media] Hives and Itching   Latex Other (See Comments)    Burns and blisters skin   Other Other (See Comments)    antidepressants   Penicillins Hives    Has patient had a PCN reaction causing immediate rash, facial/tongue/throat swelling, SOB or lightheadedness with hypotension: Yes Has patient had a PCN reaction causing severe rash involving mucus membranes or skin necrosis: Yes Has patient had a PCN reaction that required hospitalization No Has patient had a PCN reaction occurring within the last 10 years: No If all of the above answers are "NO", then may proceed with Cephalosporin use.    Medications:  Current Outpatient Medications:    nitrofurantoin, macrocrystal-monohydrate, (MACROBID) 100 MG capsule, Take 1 capsule (100 mg total) by mouth 2 (two) times daily., Disp: 10 capsule, Rfl: 0   acetaminophen (TYLENOL) 500 MG tablet, Take 1 tablet (500 mg total) by mouth every 6 (six) hours as needed., Disp: 30 tablet, Rfl: 0   albuterol (PROVENTIL HFA;VENTOLIN HFA) 108 (90 Base) MCG/ACT inhaler, Inhale 1-2 puffs into the lungs every 4 (four) hours as needed for wheezing or shortness of breath. For shortness of breath, Disp: 3.7 g, Rfl: 2   albuterol (PROVENTIL) (2.5 MG/3ML) 0.083% nebulizer solution, Take 3 mLs (2.5 mg total) by nebulization every 6 (six) hours as needed for wheezing or shortness of breath., Disp: 75 mL, Rfl: 12   albuterol (PROVENTIL) (5 MG/ML) 0.5% nebulizer solution, Take 0.5 mLs (2.5 mg total) by nebulization every 6 (six) hours as needed for wheezing or shortness of breath., Disp: 20 mL, Rfl: 12   dexlansoprazole (DEXILANT) 60 MG capsule, Take 60 mg by mouth daily. , Disp: , Rfl:    famotidine (PEPCID) 20 MG tablet, Take 1 tablet (20 mg total) by mouth daily.  (Patient taking differently: Take 20 mg by mouth at bedtime. ), Disp: 14 tablet, Rfl: 0   Fluticasone-Salmeterol (ADVAIR) 250-50 MCG/DOSE AEPB, Inhale 1 puff into the lungs 2 (two) times daily., Disp: 60 each, Rfl: 5   ibuprofen (ADVIL) 600 MG tablet, Take 1 tablet (600 mg total) by mouth every 6 (six) hours as needed., Disp: 30 tablet, Rfl: 0   montelukast (SINGULAIR) 10 MG tablet, Take 1 tablet (10 mg total) by mouth at bedtime., Disp: 30 tablet, Rfl: 5   Nebulizer System All-In-One MISC, 1 Device by Does not apply route every 6 (six) hours as needed., Disp: 1 each, Rfl: 0   omeprazole (PRILOSEC) 40 MG capsule, Take 40 mg by mouth 2 (two) times daily., Disp: , Rfl:    ondansetron (ZOFRAN-ODT) 8 MG disintegrating tablet, Take 8 mg by mouth every 8 (eight) hours as needed. , Disp: , Rfl:    tiZANidine (ZANAFLEX) 4 MG tablet, Take 4 mg by mouth at bedtime. , Disp: , Rfl:    ursodiol (ACTIGALL) 250 MG tablet, Take 250 mg by mouth 2 (two) times daily., Disp: , Rfl:   Observations/Objective: Patient is well-developed, well-nourished in no acute distress.  Resting comfortably  at home.  Head is normocephalic, atraumatic.  No labored breathing.  Speech is clear and coherent with logical content.  Patient is alert and oriented at baseline.    Assessment and Plan: 1. Suspected UTI - nitrofurantoin, macrocrystal-monohydrate, (MACROBID) 100 MG capsule; Take 1 capsule (100 mg total) by mouth 2 (two) times daily.  Dispense: 10 capsule; Refill: 0  - Worsening symptoms.  - Will treat empirically with Macrobid - May use AZO for bladder spasms - Continue to push fluids.  - Seek in person evaluation for urine culture if symptoms do not improve or if they worsen.    Follow Up Instructions: I discussed the assessment and treatment plan with the patient. The patient was provided an opportunity to ask questions and all were answered. The patient agreed with the plan and demonstrated an understanding of the  instructions.  A copy of instructions were sent to the patient via MyChart unless otherwise noted below.    The patient was advised to call back or seek an in-person evaluation if the symptoms worsen or if the condition fails to improve as anticipated.  Time:  I spent 8 minutes with the patient via telehealth technology discussing the above problems/concerns.    Melinda Loveless, PA-C

## 2023-02-21 ENCOUNTER — Ambulatory Visit
Admission: EM | Admit: 2023-02-21 | Discharge: 2023-02-21 | Disposition: A | Discharge status: Home / Self Care | Payer: BC Managed Care – PPO | Attending: Internal Medicine | Admitting: Internal Medicine

## 2023-02-21 DIAGNOSIS — J069 Acute upper respiratory infection, unspecified: Secondary | ICD-10-CM | POA: Diagnosis not present

## 2023-02-21 MED ORDER — PREDNISONE 20 MG PO TABS: 40.0000 mg | ORAL_TABLET | Freq: Every day | ORAL | 0 refills | Status: AC

## 2023-02-21 MED ORDER — ALBUTEROL SULFATE HFA 108 (90 BASE) MCG/ACT IN AERS: 1.0000 | INHALATION_SPRAY | Freq: Four times a day (QID) | RESPIRATORY_TRACT | 0 refills | Status: AC | PRN

## 2023-02-21 NOTE — ED Provider Notes (Signed)
[facility]    CSN: [id number] Arrival date & time: 02/21/23  0910      History   Chief Complaint No chief complaint on file.   HPI [name] is a 42 y.o. female.   Patient presents with nasal congestion, itchy ears, itchy nose, scratchy throat, headache, sinus pressure that started about 5 to 6 days ago.  Reports that she has also had some thoracic back pain which typically occurs when she gets sick.  Denies chest pain or shortness of breath.  Denies coughing. Reports that she has a history of asthma but has not had to use her albuterol inhaler given that she has not had any complications in quite some time.  Has been taking over-the-counter medications with minimal improvement in symptoms.  Reports that she has a known sick contact with similar symptoms.  She does report body aches and chills as well.  Denies any fever.     Past Medical History:  Diagnosis Date   Anemia    with pregnancy   Asthma    Family history of anesthesia complication    mother stopped breathing and survived    Fibromyalgia    Hiatal hernia    Migraine    tension induced    Sciatica    Stress incontinence     Patient Active Problem List   Diagnosis Date Noted   Adverse food reaction 04/18/2018   Gastroesophageal reflux disease 04/18/2018   Moderate persistent asthma, uncomplicated 02/17/2017   Seasonal and perennial allergic rhinitis 02/17/2017   Morbid obesity with body mass index of 45.0-49.9 in adult [facility]) 07/18/2015   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   ORTHOSTATIC HYPOTENSION 05/23/2009   SYNCOPE 05/23/2009   MIGRAINE HEADACHE 05/22/2009   Asthma 05/22/2009   UTI 05/22/2009   PALPITATIONS 05/22/2009   SHORTNESS OF BREATH 05/22/2009    Past Surgical History:  Procedure Laterality Date   ADENOIDECTOMY     CYSTOSCOPY N/A 01/09/2014   Procedure: CYSTOSCOPY;  Surgeon: Martina Sinner, MD;  Location: [facility];  Service: Urology;  Laterality: N/A;   HEEL SPUR  SURGERY  nov. 1, 2016   tendon surgery also   HERNIA REPAIR     LAPAROSCOPIC GASTRIC SLEEVE RESECTION WITH HIATAL HERNIA REPAIR     TONSILLECTOMY     TYMPANOSTOMY TUBE PLACEMENT     URETHRAL DIVERTICULUM REPAIR N/A 01/09/2014   Procedure: Excision of URETHRAL DIVERTICULUM;  Surgeon: Martina Sinner, MD;  Location: [facility];  Service: Urology;  Laterality: N/A;   weight loss sleeve  02/15/2018    OB History     Gravida  5   Para  4   Term  4   Preterm      AB  1   Living  4      SAB      IAB      Ectopic  1   Multiple      Live Births  4            Home Medications    Prior to Admission medications   Medication Sig Start Date End Date Taking? Authorizing Provider  albuterol (VENTOLIN HFA) 108 (90 Base) MCG/ACT inhaler Inhale 1-2 puffs into the lungs every 6 (six) hours as needed for wheezing or shortness of breath. 02/21/23  Yes Nisha Dhami, Rolly Salter E, FNP  predniSONE (DELTASONE) 20 MG tablet Take 2 tablets (40 mg total) by mouth daily for 5 days. 02/21/23 02/26/23 Yes Gustavus Bryant, FNP  acetaminophen (  TYLENOL) 500 MG tablet Take 1 tablet (500 mg total) by mouth every 6 (six) hours as needed. 12/10/22   Derwood Kaplan, MD  albuterol (PROVENTIL HFA;VENTOLIN HFA) 108 (90 Base) MCG/ACT inhaler Inhale 1-2 puffs into the lungs every 4 (four) hours as needed for wheezing or shortness of breath. For shortness of breath 04/18/18   Ellamae Sia, DO  albuterol (PROVENTIL) (2.5 MG/3ML) 0.083% nebulizer solution Take 3 mLs (2.5 mg total) by nebulization every 6 (six) hours as needed for wheezing or shortness of breath. 02/20/22   Gustavus Bryant, FNP  albuterol (PROVENTIL) (5 MG/ML) 0.5% nebulizer solution Take 0.5 mLs (2.5 mg total) by nebulization every 6 (six) hours as needed for wheezing or shortness of breath. 04/18/18   Ellamae Sia, DO  dexlansoprazole (DEXILANT) 60 MG capsule Take 60 mg by mouth daily.  04/18/18   [provider]  famotidine (PEPCID) 20 MG tablet Take 1  tablet (20 mg total) by mouth daily. Patient taking differently: Take 20 mg by mouth at bedtime.  09/22/17   Caccavale, Sophia, PA-C  Fluticasone-Salmeterol (ADVAIR) 250-50 MCG/DOSE AEPB Inhale 1 puff into the lungs 2 (two) times daily. 04/18/18   Ellamae Sia, DO  ibuprofen (ADVIL) 600 MG tablet Take 1 tablet (600 mg total) by mouth every 6 (six) hours as needed. 12/10/22   Derwood Kaplan, MD  montelukast (SINGULAIR) 10 MG tablet Take 1 tablet (10 mg total) by mouth at bedtime. 04/18/18   Ellamae Sia, DO  Nebulizer System All-In-One MISC 1 Device by Does not apply route every 6 (six) hours as needed. 02/20/22   Hiilani Jetter, Acie Fredrickson, FNP  nitrofurantoin, macrocrystal-monohydrate, (MACROBID) 100 MG capsule Take 1 capsule (100 mg total) by mouth 2 (two) times daily. 12/25/22   [name], PA-C  omeprazole (PRILOSEC) 40 MG capsule Take 40 mg by mouth 2 (two) times daily.    [provider]  ondansetron (ZOFRAN-ODT) 8 MG disintegrating tablet Take 8 mg by mouth every 8 (eight) hours as needed.  03/14/18   [provider]  tiZANidine (ZANAFLEX) 4 MG tablet Take 4 mg by mouth at bedtime.     [provider]  ursodiol (ACTIGALL) 250 MG tablet Take 250 mg by mouth 2 (two) times daily.    [provider]    Family History Family History  Problem Relation Age of Onset   Heart disease Mother    Asthma Mother    COPD Mother    Stroke Father    Seizures Father    Heart disease Maternal Grandmother     Social History Social History   Tobacco Use   Smoking status: Never   Smokeless tobacco: Never  Substance Use Topics   Alcohol use: No   Drug use: No     Allergies   Amoxicillin, Contrast media [iodinated contrast media], Latex, Other, and Penicillins   Review of Systems Review of Systems Per HPI  Physical Exam Triage Vital Signs ED Triage Vitals  Encounter Vitals Group     BP 02/21/23 0947 (!) 132/91     Systolic BP Percentile --      Diastolic BP  Percentile --      Pulse Rate 02/21/23 0947 73     Resp --      Temp 02/21/23 0947 98.6 F (37 C)     Temp Source 02/21/23 0947 Oral     SpO2 02/21/23 0947 100 %     Weight 02/21/23 0942 170 lb 6.4 oz (  77.3 kg)     Height 02/21/23 0942 5\' 2"  (1.575 m)     Head Circumference --      Peak Flow --      Pain Score 02/21/23 0942 9     Pain Loc --      Pain Education --      Exclude from Growth Chart --    No data found.  Updated Vital Signs BP (!) 132/91 (BP Location: Right Arm)   Pulse 73   Temp 98.6 F (37 C) (Oral)   Ht 5\' 2"  (1.575 m)   Wt 170 lb 6.4 oz (77.3 kg)   LMP 02/02/2023   SpO2 100%   BMI 31.17 kg/m   Visual Acuity Right Eye Distance:   Left Eye Distance:   Bilateral Distance:    Right Eye Near:   Left Eye Near:    Bilateral Near:     Physical Exam Constitutional:      General: She is not in acute distress.    Appearance: Normal appearance. She is not toxic-appearing or diaphoretic.  HENT:     Head: Normocephalic and atraumatic.     Right Ear: Tympanic membrane and ear canal normal.     Left Ear: Tympanic membrane and ear canal normal.     Nose: Congestion present.     Mouth/Throat:     Mouth: Mucous membranes are moist.     Pharynx: No posterior oropharyngeal erythema.  Eyes:     Extraocular Movements: Extraocular movements intact.     Conjunctiva/sclera: Conjunctivae normal.     Pupils: Pupils are equal, round, and reactive to light.  Cardiovascular:     Rate and Rhythm: Normal rate and regular rhythm.     Pulses: Normal pulses.     Heart sounds: Normal heart sounds.  Pulmonary:     Effort: Pulmonary effort is normal. No respiratory distress.     Breath sounds: Normal breath sounds. No stridor. No wheezing, rhonchi or rales.  Abdominal:     General: Abdomen is flat. Bowel sounds are normal.     Palpations: Abdomen is soft.  Musculoskeletal:        General: Normal range of motion.     Cervical back: Normal range of motion.  Skin:     General: Skin is warm and dry.  Neurological:     General: No focal deficit present.     Mental Status: She is alert and oriented to person, place, and time. Mental status is at baseline.  Psychiatric:        Mood and Affect: Mood normal.        Behavior: Behavior normal.      UC Treatments / Results  Labs (all labs ordered are listed, but only abnormal results are displayed) Labs Reviewed - No data to display  EKG   Radiology No results found.  Procedures Procedures (including critical care time)  Medications Ordered in UC Medications - No data to display  Initial Impression / Assessment and Plan / UC Course  I have reviewed the triage vital signs and the nursing notes.  Pertinent labs & imaging results that were available during my care of the patient were reviewed by me and considered in my medical decision making (see chart for details).     Suspect patient most likely has viral illness given known sick contacts.  Physical exam is not revealing any secondary bacterial infection or sinus infection so do not think that antibiotic therapy is necessary.  Suspect patient  does have sinus pressure from viral illness.  I do think patient would benefit from prednisone steroid therapy so this was prescribed for her as she has taken this previously and tolerated well.  Will refill albuterol inhaler as well for patient to take as needed if necessary.  Although, there are no adventitious lung sounds on exam so do not think that chest imaging or emergent evaluation is necessary.  Advised strict follow-up precautions.  Patient verbalized understanding and was agreeable with plan. Final Clinical Impressions(s) / UC Diagnoses   Final diagnoses:  Acute upper respiratory infection     Discharge Instructions      I have prescribed prednisone to decrease sinus pressure and help alleviate symptoms.  If symptoms persist or worsen, please follow-up for further evaluation and management.  I  also refilled your inhaler.    ED Prescriptions     Medication Sig Dispense Auth. Provider   predniSONE (DELTASONE) 20 MG tablet Take 2 tablets (40 mg total) by mouth daily for 5 days. 10 tablet [place], Rolly Salter E, [place]   albuterol (VENTOLIN HFA) 108 (90 Base) MCG/ACT inhaler Inhale 1-2 puffs into the lungs every 6 (six) hours as needed for wheezing or shortness of breath. 1 each Gustavus Bryant, [place]      PDMP not reviewed this encounter.   Gustavus Bryant, [place] 02/21/23 1046

## 2023-02-21 NOTE — ED Triage Notes (Signed)
Patient presents with ears, nose and throat itching, burning throat, pressure headache, sneezing, chill, low back pain, teeth pressure, congestion x day 5. Patient treated with Nyquil and Tylenol pm.

## 2023-02-21 NOTE — Discharge Instructions (Signed)
I have prescribed prednisone to decrease sinus pressure and help alleviate symptoms.  If symptoms persist or worsen, please follow-up for further evaluation and management.  I also refilled your inhaler.

## 2023-04-08 ENCOUNTER — Ambulatory Visit: Payer: BC Managed Care – PPO | Admitting: Neurology

## 2023-04-14 ENCOUNTER — Encounter: Payer: Self-pay | Admitting: Neurology

## 2023-04-14 ENCOUNTER — Other Ambulatory Visit: Payer: Self-pay | Admitting: Neurology

## 2023-04-14 ENCOUNTER — Ambulatory Visit (INDEPENDENT_AMBULATORY_CARE_PROVIDER_SITE_OTHER): Payer: BC Managed Care – PPO | Admitting: Neurology

## 2023-04-14 VITALS — BP 116/72 | HR 70 | Ht 61.5 in | Wt 171.0 lb

## 2023-04-14 DIAGNOSIS — R112 Nausea with vomiting, unspecified: Secondary | ICD-10-CM

## 2023-04-14 DIAGNOSIS — E66811 Obesity, class 1: Secondary | ICD-10-CM

## 2023-04-14 DIAGNOSIS — G4719 Other hypersomnia: Secondary | ICD-10-CM

## 2023-04-14 DIAGNOSIS — R519 Headache, unspecified: Secondary | ICD-10-CM

## 2023-04-14 DIAGNOSIS — G43001 Migraine without aura, not intractable, with status migrainosus: Secondary | ICD-10-CM | POA: Diagnosis not present

## 2023-04-14 DIAGNOSIS — R0683 Snoring: Secondary | ICD-10-CM | POA: Diagnosis not present

## 2023-04-14 DIAGNOSIS — Z6831 Body mass index (BMI) 31.0-31.9, adult: Secondary | ICD-10-CM

## 2023-04-14 MED ORDER — ONDANSETRON 8 MG PO TBDP: 8.0000 mg | ORAL_TABLET | Freq: Three times a day (TID) | ORAL | 11 refills | Status: DC | PRN

## 2023-04-14 MED ORDER — RIZATRIPTAN BENZOATE 10 MG PO TBDP: 10.0000 mg | ORAL_TABLET | ORAL | 11 refills | Status: DC | PRN

## 2023-04-14 MED ORDER — UBRELVY 100 MG PO TABS: 100.0000 mg | ORAL_TABLET | ORAL | 11 refills | Status: DC | PRN

## 2023-04-14 MED ORDER — QULIPTA 60 MG PO TABS: 60.0000 mg | ORAL_TABLET | Freq: Every day | ORAL | 6 refills | Status: DC

## 2023-04-14 MED ORDER — METHYLPREDNISOLONE 4 MG PO TBPK: ORAL_TABLET | ORAL | 1 refills | Status: DC

## 2023-04-14 NOTE — Patient Instructions (Addendum)
Prevention: Start [place] daily. If ineffective go to [place] or [facility] At onset of migraines: Try Bernita Raisin and/or Maxalt(riztriptan). May also take ondansetron. May take one or all together, experiment.   Rizatriptan or Ubrelvy: Please take one tablet at the onset of your headache. If it does not improve the symptoms please take an additional tablet in 2 hours. Do not take more then 2 tablets in 24hrs. Do not take use more then 2 to 3 times in a week.  Medrol dosepak to bridge  Atogepant Tablets What is this medication? ATOGEPANT (a TOE je pant) prevents migraines. It works by blocking a substance in the body that causes migraines. This medicine may be used for other purposes; ask your health care provider or pharmacist if you have questions. COMMON BRAND NAME(S): QULIPTA What should I tell my care team before I take this medication? They need to know if you have any of these conditions: Kidney disease Liver disease An unusual or allergic reaction to atogepant, other medications, foods, dyes, or preservatives Pregnant or trying to get pregnant Breast-feeding How should I use this medication? Take this medication by mouth with water. Take it as directed on the prescription label at the same time every day. You can take it with or without food. If it upsets your stomach, take it with food. Keep taking it unless your care team tells you to stop. Talk to your care team about the use of this medication in children. Special care may be needed. Overdosage: If you think you have taken too much of this medicine contact a poison control center or emergency room at once. NOTE: This medicine is only for you. Do not share this medicine with others. What if I miss a dose? If you miss a dose, take it as soon as you can. If it is almost time for your next dose, take only that dose. Do not take double or extra doses. What may interact with this medication? Carbamazepine Certain medications for fungal  infections, such as itraconazole, ketoconazole Clarithromycin Cyclosporine Efavirenz Etravirine Phenytoin Rifampin St. John's wort This list may not describe all possible interactions. Give your health care provider a list of all the medicines, herbs, non-prescription drugs, or dietary supplements you use. Also tell them if you smoke, drink alcohol, or use illegal drugs. Some items may interact with your medicine. What should I watch for while using this medication? Visit your care team for regular checks on your progress. Tell your care team if your symptoms do not start to get better or if they get worse. What side effects may I notice from receiving this medication? Side effects that you should report to your care team as soon as possible: Allergic reactions--skin rash, itching, hives, swelling of the face, lips, tongue, or throat Side effects that usually do not require medical attention (report to your care team if they continue or are bothersome): Constipation Fatigue Loss of appetite with weight loss Nausea This list may not describe all possible side effects. Call your doctor for medical advice about side effects. You may report side effects to FDA at 1-800-FDA-1088. Where should I keep my medication? Keep out of the reach of children and pets. Store at room temperature between 20 and 25 degrees C (68 and 77 degrees F). Get rid of any unused medication after the expiration date. To get rid of medications that are no longer needed or have expired: Take the medication to a medication take-back program. Check with your pharmacy or law  enforcement to find a location. If you cannot return the medication, check the label or package insert to see if the medication should be thrown out in the garbage or flushed down the toilet. If you are not sure, ask your care team. If it is safe to put it in the trash, take the medication out of the container. Mix the medication with cat litter, dirt,  coffee grounds, or other unwanted substance. Seal the mixture in a bag or container. Put it in the trash. NOTE: This sheet is a summary. It may not cover all possible information. If you have questions about this medicine, talk to your doctor, pharmacist, or health care provider.  2024 Elsevier/Gold Standard (2021-06-23 00:00:00) Ubrogepant Tablets What is this medication? UBROGEPANT (ue BROE je pant) treats migraines. It works by blocking a substance in the body that causes migraines. It is not used to prevent migraines. This medicine may be used for other purposes; ask your health care provider or pharmacist if you have questions. COMMON BRAND NAME(S): Bernita Raisin What should I tell my care team before I take this medication? They need to know if you have any of these conditions: Kidney disease Liver disease An unusual or allergic reaction to ubrogepant, other medications, foods, dyes, or preservatives Pregnant or trying to get pregnant Breast-feeding How should I use this medication? Take this medication by mouth with a glass of water. Take it as directed on the prescription label. You can take it with or without food. If it upsets your stomach, take it with food. Keep taking it unless your care team tells you to stop. Talk to your care team about the use of this medication in children. Special care may be needed. Overdosage: If you think you have taken too much of this medicine contact a poison control center or emergency room at once. NOTE: This medicine is only for you. Do not share this medicine with others. What if I miss a dose? This does not apply. This medication is not for regular use. What may interact with this medication? Do not take this medication with any of the following: Adagrasib Ceritinib Certain antibiotics, such as chloramphenicol, clarithromycin, telithromycin Certain antivirals for HIV, such as atazanavir, cobicistat, darunavir, delavirdine, fosamprenavir, indinavir,  ritonavir Certain medications for fungal infections, such as itraconazole, ketoconazole, posaconazole, voriconazole Conivaptan Grapefruit Idelalisib Mifepristone Nefazodone Ribociclib This medication may also interact with the following: Carvedilol Certain medications for seizures, such as phenobarbital, phenytoin Ciprofloxacin Cyclosporine Eltrombopag Fluconazole Fluvoxamine Quinidine Rifampin St. John's wort Verapamil This list may not describe all possible interactions. Give your health care provider a list of all the medicines, herbs, non-prescription drugs, or dietary supplements you use. Also tell them if you smoke, drink alcohol, or use illegal drugs. Some items may interact with your medicine. What should I watch for while using this medication? Visit your care team for regular checks on your progress. Tell your care team if your symptoms do not start to get better or if they get worse. Your mouth may get dry. Chewing sugarless gum or sucking hard candy and drinking plenty of water may help. Contact your care team if the problem does not go away or is severe. What side effects may I notice from receiving this medication? Side effects that you should report to your care team as soon as possible: Allergic reactions--skin rash, itching, hives, swelling of the face, lips, tongue, or throat Side effects that usually do not require medical attention (report to your care team if they  continue or are bothersome): Drowsiness Dry mouth Fatigue Nausea This list may not describe all possible side effects. Call your doctor for medical advice about side effects. You may report side effects to FDA at 1-800-FDA-1088. Where should I keep my medication? Keep out of the reach of children and pets. Store between 15 and 30 degrees C (59 and 86 degrees F). Get rid of any unused medication after the expiration date. To get rid of medications that are no longer needed or have expired: Take the  medication to a medication take-back program. Check with your pharmacy or law enforcement to find a location. If you cannot return the medication, check the label or package insert to see if the medication should be thrown out in the garbage or flushed down the toilet. If you are not sure, ask your care team. If it is safe to put it in the trash, pour the medication out of the container. Mix the medication with cat litter, dirt, coffee grounds, or other unwanted substance. Seal the mixture in a bag or container. Put it in the trash. NOTE: This sheet is a summary. It may not cover all possible information. If you have questions about this medicine, talk to your doctor, pharmacist, or health care provider.  2024 Elsevier/Gold Standard (2021-06-27 00:00:00)   Rizatriptan Tablets What is this medication? RIZATRIPTAN (rye za TRIP tan) treats migraines. It works by blocking pain signals and narrowing blood vessels in the brain. It belongs to a group of medications called triptans. It is not used to prevent migraines. This medicine may be used for other purposes; ask your health care provider or pharmacist if you have questions. COMMON BRAND NAME(S): Maxalt What should I tell my care team before I take this medication? They need to know if you have any of these conditions: Circulation problems in fingers and toes Diabetes Heart disease High blood pressure High cholesterol History of irregular heartbeat History of stroke Stomach or intestine problems Tobacco use An unusual or allergic reaction to rizatriptan, other medications, foods, dyes, or preservatives Pregnant or trying to get pregnant Breast-feeding How should I use this medication? Take this medication by mouth with water. Take it as directed on the prescription label. Do not use it more often than directed. Talk to your care team about the use of this medication in children. While it may be prescribed for children as young as 6 years for  selected conditions, precautions do apply. Overdosage: If you think you have taken too much of this medicine contact a poison control center or emergency room at once. NOTE: This medicine is only for you. Do not share this medicine with others. What if I miss a dose? This does not apply. This medication is not for regular use. What may interact with this medication? Do not take this medication with any of the following: Ergot alkaloids, such as dihydroergotamine, ergotamine MAOIs, such as Marplan, Nardil, Parnate Other medications for migraine headache, such as almotriptan, eletriptan, frovatriptan, naratriptan, sumatriptan, zolmitriptan This medication may also interact with the following: Certain medications for depression, anxiety, or other mental health conditions Propranolol This list may not describe all possible interactions. Give your health care provider a list of all the medicines, herbs, non-prescription drugs, or dietary supplements you use. Also tell them if you smoke, drink alcohol, or use illegal drugs. Some items may interact with your medicine. What should I watch for while using this medication? Visit your care team for regular checks on your progress. Tell your  care team if your symptoms do not start to get better or if they get worse. This medication may affect your coordination, reaction time, or judgment. Do not drive or operate machinery until you know how this medication affects you. Sit up or stand slowly to reduce the risk of dizzy or fainting spells. If you take migraine medications for 10 or more days a month, your migraines may get worse. Keep a diary of headache days and medication use. Contact your care team if your migraine attacks occur more frequently. What side effects may I notice from receiving this medication? Side effects that you should report to your care team as soon as possible: Allergic reactions--skin rash, itching, hives, swelling of the face, lips,  tongue, or throat Burning, pain, tingling, or color changes in the hands, arms, legs, or feet Heart attack--pain or tightness in the chest, shoulders, arms, or jaw, nausea, shortness of breath, cold or clammy skin, feeling faint or lightheaded Heart rhythm changes--fast or irregular heartbeat, dizziness, feeling faint or lightheaded, chest pain, trouble breathing Increase in blood pressure Irritability, confusion, fast or irregular heartbeat, muscle stiffness, twitching muscles, sweating, high fever, seizure, chills, vomiting, diarrhea, which may be signs of serotonin syndrome Raynaud syndrome--cool, numb, or painful fingers or toes that may change color from pale, to blue, to red Seizures Stroke--sudden numbness or weakness of the face, arm, or leg, trouble speaking, confusion, trouble walking, loss of balance or coordination, dizziness, severe headache, change in vision Sudden or severe stomach pain, bloody diarrhea, fever, nausea, vomiting Vision loss Side effects that usually do not require medical attention (report to your care team if they continue or are bothersome): Dizziness Unusual weakness or fatigue This list may not describe all possible side effects. Call your doctor for medical advice about side effects. You may report side effects to FDA at 1-800-FDA-1088. Where should I keep my medication? Keep out of the reach of children and pets. Store at room temperature between 15 and 30 degrees C (59 and 86 degrees F). Get rid of any unused medication after the expiration date. To get rid of medications that are no longer needed or have expired: Take the medication to a medication take-back program. Check with your pharmacy or law enforcement to find a location. If you cannot return the medication, check the label or package insert to see if the medication should be thrown out in the garbage or flushed down the toilet. If you are not sure, ask your care team. If it is safe to put it in the  trash, empty the medication out of the container. Mix the medication with cat litter, dirt, coffee grounds, or other unwanted substance. Seal the mixture in a bag or container. Put it in the trash. NOTE: This sheet is a summary. It may not cover all possible information. If you have questions about this medicine, talk to your doctor, pharmacist, or health care provider.  2024 Elsevier/Gold Standard (2021-09-04 00:00:00)   Sleep study: She has morning headaches and often can wake her up. Husband says she snores,she stops breathing, wakes frequently she tries to take benadril or tylenol pm otherwise she is up all night. Dry mouth in the morning. She is tired all day, wants to take a nap, never feels refereshed.  She had an endoscopy and she was told she stopped breathing and he had to hold her neck open to breathe and anesthesiologist stated she should get test for sleep apnea, never refreshed, low energy.  Sleep Apnea  Sleep apnea is a condition that affects your breathing while you are sleeping. Your tongue or soft tissue in your throat may block the flow of air while you sleep. You may have shallow breathing or stop breathing for short periods of time. People with sleep apnea may snore loudly. There are three kinds of sleep apnea: Obstructive sleep apnea. This kind is caused by a blocked or collapsed airway. This is the most common. Central sleep apnea. This kind happens when the part of the brain that controls breathing does not send the correct signals to the muscles that control breathing. Mixed sleep apnea. This is a combination of obstructive and central sleep apnea. What are the causes? The most common cause of sleep apnea is a collapsed or blocked airway. What increases the risk? Being very overweight. Having family members with sleep apnea. Having a tongue or tonsils that are larger than normal. Having a small airway or jaw problems. Being older. What are the signs or symptoms? Loud  snoring. Restless sleep. Trouble staying asleep. Being sleepy or tired during the day. Waking up gasping or choking. Having a headache in the morning. Mood swings. Having a hard time remembering things and concentrating. How is this diagnosed? A medical history. A physical exam. A sleep study. This is also called a polysomnography test. This test is done at a sleep lab or in your home while you are sleeping. How is this treated? Treatment may include: Sleeping on your side. Losing weight if you're overweight. Wearing an oral appliance. This is a mouthpiece that moves your lower jaw forward. Using a positive airway pressure (PAP) device to keep your airways open while you sleep, such as: A continuous positive airway pressure (CPAP) device. This device gives forced air through a mask when you breathe out. This keeps your airways open. A bilevel positive airway pressure (BIPAP) device. This device gives forced air through a mask when you breathe in and when you breathe out to keep your airways open. Having surgery if other treatments do not work. If your sleep apnea is not treated, you may be at risk for: Heart failure. Heart attack. Stroke. Type 2 diabetes or a problem with your blood sugar called insulin resistance. Follow these instructions at home: Medicines Take your medicines only as told by your health care provider. Avoid alcohol, medicines to help you relax, and certain pain medicines. These may make sleep apnea worse. General instructions Do not smoke, vape, or use products with nicotine or tobacco in them. If you need help quitting, talk with your provider. If you were given a PAP device to open your airway while you sleep, use it as told by your provider. If you're having surgery, make sure to tell your provider you have sleep apnea. You may need to bring your PAP device with you. Contact a health care provider if: The PAP device that you were given to use during sleep  bothers you or does not seem to be working. You do not feel better or you feel worse. Get help right away if: You have trouble breathing. You have chest pain. You have trouble talking. One side of your body feels weak. A part of your face is hanging down. These symptoms may be an emergency. Call 911 right away. Do not wait to see if the symptoms will go away. Do not drive yourself to the hospital. This information is not intended to replace advice given to you by your health care provider. Make sure you discuss  any questions you have with your health care provider. Document Revised: 07/09/2022 Document Reviewed: 07/09/2022 Elsevier Patient Education  2024 [employer]. Methylprednisolone Solution Injection What is this medication? METHYLPREDNISOLONE (meth ill pred NISS oh lone) treats many conditions such as asthma, allergic reactions, arthritis, inflammatory bowel diseases, adrenal, and blood or bone marrow disorders. It works by decreasing inflammation, slowing down an overactive immune system, or replacing cortisol normally made in the body. Cortisol is a hormone that plays an important role in how the body responds to stress, illness, and injury. It belongs to a group of medications called steroids. This medicine may be used for other purposes; ask your health care provider or pharmacist if you have questions. COMMON BRAND NAME(S): A-Methapred, Solu-Medrol What should I tell my care team before I take this medication? They need to know if you have any of these conditions: Cushing's syndrome Eye disease, vision problems Diabetes Glaucoma Heart disease High blood pressure Infection especially a viral infection, such as chickenpox, cold sores, or herpes Liver disease Mental health conditions Myasthenia gravis Osteoporosis Recent or upcoming vaccine Seizures Stomach or intestine problems Thyroid disease An unusual or allergic reaction to lactose, methylprednisolone, other  medications, foods, dyes, or preservatives Pregnant or trying to get pregnant Breastfeeding How should I use this medication? This medication is for injection or infusion into a vein. It is also for injection into a muscle. It is given by your care team in a hospital or clinic setting. Talk to your care team about the use of this medication in children. While this medication may be prescribed for selected conditions, precautions do apply. Overdosage: If you think you have taken too much of this medicine contact a poison control center or emergency room at once. NOTE: This medicine is only for you. Do not share this medicine with others. What if I miss a dose? This does not apply. What may interact with this medication? Do not take this medication with any of the following: Alefacept Echinacea Iopamidol Live virus vaccines Metyrapone Mifepristone This medication may also interact with the following: Amphotericin B Aspirin and aspirin-like medications Certain antibiotics, such as erythromycin, clarithromycin, troleandomycin Certain medications for diabetes Certain medications for fungal infections, such as ketoconazole Certain medications for seizures, such as carbamazepine, phenobarbital, phenytoin Certain medications that treat or prevent blood clots, such as warfarin Cyclosporine Digoxin Diuretics Estrogen or progestin hormones Isoniazid NSAIDS, medications for pain and inflammation, such as ibuprofen or naproxen Other medications for myasthenia gravis Rifampin Vaccines This list may not describe all possible interactions. Give your health care provider a list of all the medicines, herbs, non-prescription drugs, or dietary supplements you use. Also tell them if you smoke, drink alcohol, or use illegal drugs. Some items may interact with your medicine. What should I watch for while using this medication? Tell your care team if your symptoms do not start to get better or if they  get worse. Do not stop taking except on your care team's advice. You may develop a severe reaction. Your care team will tell you how much medication to take. Your condition will be monitored carefully while you are receiving this medication. This medication may increase your risk of getting an infection. Tell your care team if you are around anyone with measles or chickenpox, or if you develop sores or blisters that do not heal properly. This medication may increase blood sugar. Ask your care team if changes in diet or medications are needed if you have diabetes. Tell your care team  right away if you have any change in your eyesight. Using this medication for a long time may increase your risk of low bone mass. Talk to your care team about bone health. What side effects may I notice from receiving this medication? Side effects that you should report to your care team as soon as possible: Allergic reactions--skin rash, itching, hives, swelling of the face, lips, tongue, or throat Cushing syndrome--increased fat around the midsection, upper back, neck, or face, pink or purple stretch marks on the skin, thinning, fragile skin that easily bruises, unexpected hair growth High blood sugar (hyperglycemia)--increased thirst or amount of urine, unusual weakness or fatigue, blurry vision Increase in blood pressure Infection--fever, chills, cough, sore throat, wounds that don't heal, pain or trouble when passing urine, general feeling of discomfort or being unwell Low adrenal gland function--nausea, vomiting, loss of appetite, unusual weakness or fatigue, dizziness Mood and behavior changes--anxiety, nervousness, confusion, hallucinations, irritability, hostility, thoughts of suicide or self-harm, worsening mood, feelings of depression Stomach bleeding--bloody or black, tar-like stools, vomiting blood or brown material that looks like coffee grounds Swelling of the ankles, hands, or feet Side effects that  usually do not require medical attention (report to your care team if they continue or are bothersome): Acne General discomfort and fatigue Headache Increase in appetite Nausea Trouble sleeping Weight gain This list may not describe all possible side effects. Call your doctor for medical advice about side effects. You may report side effects to FDA at 1-800-FDA-1088. Where should I keep my medication? This medication is given in a hospital or clinic and will not be stored at home. NOTE: This sheet is a summary. It may not cover all possible information. If you have questions about this medicine, talk to your doctor, pharmacist, or health care provider.  2024 Elsevier/Gold Standard (2021-12-31 00:00:00)

## 2023-04-14 NOTE — Progress Notes (Signed)
[facility]    Provider:  Dr Lucia Gaskins Requesting Provider: Derwood Kaplan, MD Primary Care Provider:  Clemencia Course, PA-C  CC:  migraines  HPI:  [name] is a 42 y.o. female here as requested by Derwood Kaplan, MD for migraines.  I reviewed her epic chart, she saw my colleague back in 2015 however it was for pain after recent vaginal birth, sharp pain in the lumbar region radiating up to her head.  Today she is here for migraines.  She was seen in the ED in July past medical history of asthma and fibromyalgia presenting with migraine type headache with associated nausea and photophobia.  She reported a history of migraines, they checked a CTV which was negative, on reassessment the headache had resolved.  Headaches on the right side moved to the left, throbbing sharp burning associated nausea and photophobia phonophobia.  They started on a headache cocktail.  Low suspicion for secondary headache, headache was also not thunderclap in nature. She uses birth control no more kids.   Headaches on the right side right parietal then can spread to the left, pulsating/poundng/throbbing, light and sound and smell sensitivity and sometimes trigger a migraine any odor can trigger, hurts to move, nausea and vomiting. She also feels "heavy" she felt her head heavy. She also has neck tightness. Last migraine was so bad she slept for 4 days was 10/10 back in the ED in July, acute. She has had 7/10 migraines since then.   She has morning headaches and often can wake her up. Husband says she snores,she stops breathing, wakes frequently she tries to take benadril or tylenol pm otherwise she is up all night. Dry mouth in the morning. She is tired all day, wants to take a nap, never feels refereshed.  She had an endoscopy and she was told she stopped breathing and he had to hold her neck open to breathe and anesthesiologist stated she should get test for sleep apnea. 12 headache days  a month. 6 migraine days a month that are moderate to severe and last all day. Even washing her hair can trigger a migraine. No aura. No medication overuse.  She has been having severe headaches at this freq and severity > 3 months. First had a migraine from 29s, daughter has migraines and father had headaches.   Medications tried > 3 months: propranolol contraindicated due to asthma and also low BP 116 systolic today would not give any blood pressure meds due to risk of hypotension so contraindacted, ibuprofen,tylenol, solumedrol, cymbalta, lyica, gabapentin, amitriptyline, topiramate, nortriptyline, sumatriptan, maxalt. Aimovig contraindicated die to constipation.   Patient states that she has migraines associated with pulsating pounding throbbing quality, unilateral, associated nausea and photophobia, phonophobia, hurts to move, can be moderate to severe and can last 4 to 24 hours.    Reviewed notes, labs and imaging from outside physicians, which showed:  Following with endocrinology for thyroid nodule. TSH has been normal (reviewed from 2023).     Latest Ref Rng & Units 12/10/2022   12:50 PM 04/19/2018   10:10 AM 09/22/2017   12:11 PM  CBC  WBC 4.0 - 10.5 K/uL 5.0  5.5  6.7   Hemoglobin 12.0 - 15.0 g/dL 60.4  54.0  98.1   Hematocrit 36.0 - 46.0 % 37.5  41.4  38.7   Platelets 150 - 400 K/uL 265  254  269        Latest Ref Rng & Units 12/10/2022   12:50 PM  04/19/2018   12:32 PM 04/19/2018   10:10 AM  CMP  Glucose 70 - 99 mg/dL 90   99   BUN 6 - 20 mg/dL 16   8   Creatinine 6.29 - 1.00 mg/dL 5.28   4.13   Sodium 244 - 145 mmol/L 137   138   Potassium 3.5 - 5.1 mmol/L 4.3   3.0   Chloride 98 - 111 mmol/L 104   102   CO2 22 - 32 mmol/L 25   23   Calcium 8.9 - 10.3 mg/dL 8.8   9.3   Total Protein 6.5 - 8.1 g/dL  6.7    Total Bilirubin 0.3 - 1.2 mg/dL  0.6    Alkaline Phos 38 - 126 U/L  56    AST 15 - 41 U/L  18    ALT 0 - 44 U/L  15       12/10/2022: CTV CLINICAL DATA:  Provided  history: Dural venous sinus thrombosis suspected. Headaches. Nausea. Photophobia.  MRi brain in 2015 for migraines (same symptoms):  TUDY DATE:  06/18/2013 PATIENT NAME: [name] DOB: June 27, 1980 MRN: [id number]  ORDERING CLINICIAN:  Dr Hosie Poisson CLINICAL HISTORY: 32 year patient with headache post partum COMPARISON FILMS:  none EXAM:  MRI Brain wo TECHNIQUE: MRI of the brain without contrast was obtained utilizing 5 mm axial slices with T1, T2, T2 flair, T2 star gradient echo and diffusion weighted views.  T1 sagittal and T2 coronal views were obtained. CONTRAST: none IMAGING SITE: [facility] FINDINGS: The brain parenchyma shows no abnormal areas of signal intensities. No structural lesion, tumor or infarcts are noted. There is a small cyst of velum interpositum  noted.No abnormal lesions are seen on diffusion-weighted views to suggest acute ischemia. The cortical sulci, fissures and cisterns are normal in size and appearance. Lateral, third and fourth ventricle are normal in size and appearance. No extra-axial fluid collections are seen. No evidence of mass effect or midline shift.   On sagittal views the posterior fossa, pituitary gland and corpus callosum are unremarkable. No evidence of intracranial hemorrhage on gradient-echo views. The orbits and their contents,  and calvarium are unremarkable. The paranasal sinuses show mild chronic inflammatory changes.  Intracranial flow voids are present.      Impression   Unremarkable MRI scan of the brain without contrast   EXAM: CT HEAD WITHOUT CONTRAST   TECHNIQUE: Contiguous axial images were obtained from the base of the skull through the vertex without intravenous contrast.   RADIATION DOSE REDUCTION: This exam was performed according to the departmental dose-optimization program which includes automated exposure control, adjustment of the mA and/or kV according to patient size and/or use of iterative  reconstruction technique.   COMPARISON:  MRI brain, MRA head and MRV head 06/18/2013.   FINDINGS: NON-CONTRAST HEAD CT:   Brain:   Cerebral volume is normal.   There is no acute intracranial hemorrhage.   No demarcated cortical infarct.   No extra-axial fluid collection.   No evidence of an intracranial mass.   No midline shift.   Vascular: No hyperdense vessel.   Skull: No calvarial fracture or aggressive osseous lesion.   Sinuses/Orbits: No mass or acute finding within the imaged orbits. No significant paranasal sinus disease at the imaged levels.   CT VENOGRAM HEAD:   The superior sagittal sinus, internal cerebral veins, vein of Galen, straight sinus, transverse sinuses, sigmoid sinuses and visualized jugular veins are patent. There is no appreciable intracranial venous thrombosis.  IMPRESSION: 1. Unremarkable non-contrast CT appearance of the brain. No evidence of an acute intracranial abnormality. 2. No evidence of dural venous sinus thrombosis on CT venogram.    Review of Systems: Patient complains of symptoms per HPI as well as the following symptoms none. Pertinent negatives and positives per HPI. All others negative.   Social History   Socioeconomic History   Marital status: Married    Spouse name: [place]   Number of children: 4   Years of education: college   Highest education level: Not on file  Occupational History   Occupation: TEACHER    Employer: [employer]  Tobacco Use   Smoking status: Never   Smokeless tobacco: Never  Substance and Sexual Activity   Alcohol use: No   Drug use: No   Sexual activity: Yes    Birth control/protection: None  Other Topics Concern   Not on file  Social History Narrative   Patient lives at home with her husband Production manager) and 4 children   Patient is left handed   Education some college   Caffeine consumption if any 1 a day   Social Determinants of Health   Financial Resource Strain:  Not on file  Food Insecurity: No Food Insecurity (05/29/2021)   Received from [facility], [facility]   Hunger Vital Sign    Worried About Running Out of Food in the Last Year: Never true    Ran Out of Food in the Last Year: Never true  Transportation Needs: Not on file  Physical Activity: Not on file  Stress: Not on file  Social Connections: Unknown (09/22/2021)   Received from [facility], [facility]   Social Network    Social Network: Not on file  Intimate Partner Violence: Unknown (08/22/2021)   Received from [facility], [facility]   HITS    Physically Hurt: Not on file    Insult or Talk Down To: Not on file    Threaten Physical Harm: Not on file    Scream or Curse: Not on file    Family History  Problem Relation Age of Onset   Heart disease Mother    Asthma Mother    COPD Mother    Stroke Father    Seizures Father    Heart disease Maternal Grandmother     Past Medical History:  Diagnosis Date   Anemia    with pregnancy   Asthma    Family history of anesthesia complication    mother stopped breathing and survived    Fibromyalgia    Hiatal hernia    Migraine    tension induced    Sciatica    Stress incontinence     Patient Active Problem List   Diagnosis Date Noted   Adverse food reaction 04/18/2018   Gastroesophageal reflux disease 04/18/2018   Moderate persistent asthma, uncomplicated 02/17/2017   Seasonal and perennial allergic rhinitis 02/17/2017   Morbid obesity with body mass index of 45.0-49.9 in adult [facility]) 07/18/2015   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  1, 2016   tendon surgery also   HERNIA REPAIR     LAPAROSCOPIC GASTRIC SLEEVE RESECTION WITH HIATAL HERNIA REPAIR     TONSILLECTOMY     TYMPANOSTOMY TUBE PLACEMENT     URETHRAL DIVERTICULUM REPAIR N/A 01/09/2014   Procedure: Excision of URETHRAL DIVERTICULUM;  Surgeon: Martina Sinner, MD;  Location: [facility];  Service: Urology;  Laterality: N/A;   weight loss sleeve  02/15/2018    Current Outpatient Medications  Medication Sig Dispense Refill   albuterol (PROVENTIL HFA;VENTOLIN HFA) 108 (90 Base) MCG/ACT inhaler Inhale 1-2 puffs into the lungs every 4 (four) hours as needed for wheezing or shortness of breath. For shortness of breath 3.7 g 2   albuterol (PROVENTIL) (2.5 MG/3ML) 0.083% nebulizer solution Take 3 mLs (2.5 mg total) by nebulization every 6 (six) hours as needed for wheezing or shortness of breath. 75 mL 12   albuterol (PROVENTIL) (5 MG/ML) 0.5% nebulizer solution Take 0.5 mLs (2.5 mg total) by nebulization every 6 (six) hours as needed for wheezing or shortness of breath. 20 mL 12   albuterol (VENTOLIN HFA) 108 (90 Base) MCG/ACT inhaler Inhale 1-2 puffs into the lungs every 6 (six) hours as needed for wheezing or shortness of breath. 1 each 0   Atogepant (QULIPTA) 60 MG TABS Take 1 tablet (60 mg total) by mouth daily. PLEASE USE COPAY CARD: BIN [id number] PCN OHCP GRP [id number] ID [id number] 30 tablet 6   Fluticasone-Salmeterol (ADVAIR) 250-50 MCG/DOSE AEPB Inhale 1 puff into the lungs 2 (two) times daily. 60 each 5   methylPREDNISolone (MEDROL DOSEPAK) 4 MG TBPK tablet Take pills daily all together with food. Take the first dose (6 pills) as soon as possible. Take the rest each morning. For 6 days total 6-5-4-3-2-1. 21 tablet 1   Nebulizer System All-In-One MISC 1 Device by Does not apply route every 6 (six) hours as needed. 1 each 0   omeprazole (PRILOSEC) 40 MG capsule Take 40 mg by mouth 2 (two) times daily.     ondansetron  (ZOFRAN-ODT) 8 MG disintegrating tablet Take 1 tablet (8 mg total) by mouth every 8 (eight) hours as needed for nausea or vomiting. 30 tablet 11   rizatriptan (MAXALT-MLT) 10 MG disintegrating tablet Take 1 tablet (10 mg total) by mouth as needed for migraine. May repeat in 2 hours if needed 9 tablet 11   Ubrogepant (UBRELVY) 100 MG TABS Take 1 tablet (100 mg total) by mouth every 2 (two) hours as needed. Maximum 200mg  a day. PLEASE USE COPAY CARD: BIN [id number] PCN 54 GRP [id number] ID [id number] 16 tablet 11   No current facility-administered medications for this visit.    Allergies as of 04/14/2023 - Review Complete 04/14/2023  Allergen Reaction Noted   Amoxicillin Hives    Contrast media [iodinated contrast media] Hives and Itching 02/17/2017   Latex Other (See Comments)    Other Other (See Comments) 06/20/2021   Penicillins Hives     Vitals: BP 116/72   Pulse 70   Ht 5' 1.5" (1.562 m)   Wt 171 lb (77.6 kg)   BMI 31.79 kg/m  Last Weight:  Wt Readings from Last 1 Encounters:  04/14/23 171 lb (77.6 kg)   Last Height:   Ht Readings from Last 1 Encounters:  04/14/23 5' 1.5" (1.562 m)     Physical exam: Exam: Gen: NAD, conversant, well nourised, obese, well groomed  CV: RRR, no MRG. No Carotid Bruits. No peripheral edema, warm, nontender Eyes: Conjunctivae clear without exudates or hemorrhage  Neuro: Detailed Neurologic Exam  Speech:    Speech is normal; fluent and spontaneous with normal comprehension.  Cognition:    The patient is oriented to person, place, and time;     recent and remote memory intact;     language fluent;     normal attention, concentration,     fund of knowledge Cranial Nerves:    The pupils are equal, round, and reactive to light. The fundi are normal and spontaneous venous pulsations are present. Visual fields are full to finger confrontation. Extraocular movements are intact. Trigeminal sensation is intact and the muscles of  mastication are normal. The face is symmetric. The palate elevates in the midline. Hearing intact. Voice is normal. Shoulder shrug is normal. The tongue has normal motion without fasciculations.   Coordination:    Normal finger to nose and heel to shin. Normal rapid alternating movements.   Gait:    Heel-toe and tandem gait are normal.   Motor Observation:    No asymmetry, no atrophy, and no involuntary movements noted. Tone:    Normal muscle tone.    Posture:    Posture is normal. normal erect    Strength:    Strength is V/V in the upper and lower limbs.      Sensation: intact to LT     Reflex Exam:  DTR's:    Deep tendon reflexes in the upper and lower extremities are normal bilaterally.   Toes:    The toes are downgoing bilaterally.   Clonus:    Clonus is absent.    Assessment/Plan:  Patient with migraines as well as possible sleep apnea  Prevention: Start Qulipta daily. If ineffective go to [place] or Emgality At onset of migraines: Try Bernita Raisin and/or Maxalt(riztriptan). May also take ondansetron. May take one or all together, experiment.   Rizatriptan or Ubrelvy: Please take one tablet at the onset of your headache. If it does not improve the symptoms please take an additional tablet in 2 hours. Do not take more then 2 tablets in 24hrs. Do not take use more then 2 to 3 times in a week.  Medrol dosepak to bridge  She has morning headaches and often can wake her up. Husband says she snores,she stops breathing, wakes frequently she tries to take benadril or tylenol pm otherwise she is up all night. Dry mouth in the morning. She is tired all day, wants to take a nap, never feels refereshed.  She had an endoscopy and she was told she stopped breathing and he had to hold her neck open to breathe and anesthesiologist stated she should get test for sleep apnea, never refreshed, low energy.  Orders Placed This Encounter  Procedures   Ambulatory referral to Sleep Studies   Meds  ordered this encounter  Medications   Atogepant (QULIPTA) 60 MG TABS    Sig: Take 1 tablet (60 mg total) by mouth daily. PLEASE USE COPAY CARD: BIN [id number] PCN [facility] GRP [id number] ID [id number]    Dispense:  30 tablet    Refill:  6    PLEASE USE COPAY CARD: BIN [id number] PCN OHCP GRP [id number] ID [id number]   ondansetron (ZOFRAN-ODT) 8 MG disintegrating tablet    Sig: Take 1 tablet (8 mg total) by mouth every 8 (eight) hours as needed for nausea or vomiting.    Dispense:  30 tablet    Refill:  11   Ubrogepant (UBRELVY) 100 MG TABS    Sig: Take 1 tablet (100 mg total) by mouth every 2 (two) hours as needed. Maximum 200mg  a day. PLEASE USE COPAY CARD: BIN [id number] PCN 54 GRP [id number] ID [id number]    Dispense:  16 tablet    Refill:  11    PLEASE USE COPAY CARD: BIN [id number] PCN 54 GRP [id number] ID [id number]   rizatriptan (MAXALT-MLT) 10 MG disintegrating tablet    Sig: Take 1 tablet (10 mg total) by mouth as needed for migraine. May repeat in 2 hours if needed    Dispense:  9 tablet    Refill:  11   methylPREDNISolone (MEDROL DOSEPAK) 4 MG TBPK tablet    Sig: Take pills daily all together with food. Take the first dose (6 pills) as soon as possible. Take the rest each morning. For 6 days total 6-5-4-3-2-1.    Dispense:  21 tablet    Refill:  1    Cc: Derwood Kaplan, MD,  Clemencia Course, PA-C  Naomie Dean, MD  [facility] [address] [place], [place] [address]  Phone [phone] Fax [phone]  I spent over 60 minutes of face-to-face and non-face-to-face time with patient on the  1. Migraine without aura and with status migrainosus, not intractable   2. Morning headache   3. Snoring   4. Excessive daytime sleepiness   5. Class 1 obesity with serious comorbidity and body mass index (BMI) of 31.0 to 31.9 in adult, unspecified obesity type   6. Nausea and vomiting, unspecified vomiting type    diagnosis.  This included previsit  chart review, lab review, study review, order entry, electronic health record documentation, patient education on the different diagnostic and therapeutic options, counseling and coordination of care, risks and benefits of management, compliance, or risk factor reduction

## 2023-04-19 ENCOUNTER — Other Ambulatory Visit (HOSPITAL_COMMUNITY): Payer: Self-pay

## 2023-04-19 ENCOUNTER — Telehealth: Payer: Self-pay

## 2023-04-19 NOTE — Telephone Encounter (Signed)
Pharmacy Patient Advocate Encounter  Received notification from [facility] that Prior Authorization for Ubrelvy 100MG  tablets has been APPROVED from 04/19/2023 to 04/18/2024. Ran test claim, Copay is $0. This test claim was processed through [facility]- copay amounts may vary at other pharmacies due to pharmacy/plan contracts, or as the patient moves through the different stages of their insurance plan.   PA #/Case ID/Reference #: PA Case ID #: [id number]

## 2023-04-19 NOTE — Telephone Encounter (Signed)
Pharmacy Patient Advocate Encounter   Received notification from RX Request Messages that prior authorization for Ubrelvy 100MG  Tablets is required/requested.   Insurance verification completed.   The patient is insured through [facility] .   Per test claim: PA required; PA submitted to above mentioned insurance via CoverMyMeds Key/confirmation #/EOC [id number] Status is pending

## 2023-04-20 NOTE — Telephone Encounter (Signed)
I called the pt and let her know of Ubrelvy approval. I advised her to contact [facility] to get refill if she is in need. She was very Adult nurse.

## 2023-05-25 ENCOUNTER — Institutional Professional Consult (permissible substitution): Payer: Self-pay | Admitting: Neurology

## 2023-06-17 ENCOUNTER — Encounter: Payer: Self-pay | Admitting: Neurology

## 2023-06-17 ENCOUNTER — Institutional Professional Consult (permissible substitution): Payer: Self-pay | Admitting: Neurology

## 2023-06-21 ENCOUNTER — Ambulatory Visit
Admission: EM | Admit: 2023-06-21 | Discharge: 2023-06-21 | Disposition: A | Discharge status: Home / Self Care | Payer: 59 | Attending: Family Medicine | Admitting: Family Medicine

## 2023-06-21 ENCOUNTER — Encounter: Payer: Self-pay | Admitting: Emergency Medicine

## 2023-06-21 ENCOUNTER — Other Ambulatory Visit: Payer: Self-pay

## 2023-06-21 DIAGNOSIS — R051 Acute cough: Secondary | ICD-10-CM | POA: Diagnosis not present

## 2023-06-21 DIAGNOSIS — B349 Viral infection, unspecified: Secondary | ICD-10-CM

## 2023-06-21 LAB — POC COVID19/FLU A&B COMBO
Covid Antigen, POC: NEGATIVE
Influenza A Antigen, POC: NEGATIVE
Influenza B Antigen, POC: NEGATIVE

## 2023-06-21 NOTE — ED Triage Notes (Signed)
Symptoms started Saturday.  Patient reports having a cough, headache, sore throat, chills, runny nose and congestion   Has taken tylenol flu and cold and ibuprophen

## 2023-06-21 NOTE — ED Provider Notes (Signed)
[facility]    CSN: [id number] Arrival date & time: 06/21/23  1225      History   Chief Complaint Chief Complaint  Patient presents with   URI    HPI TERESEA DONLEY is a 43 y.o. female.    URI Presenting symptoms: congestion, cough, ear pain, fatigue, fever, rhinorrhea and sore throat   Associated symptoms: headaches and myalgias   Sick for 2 days symptoms include fever to 101 headache, sore throat, body aches, fatigue, runny nose, stuffy nose, cough, chills.  Her 2 sons have been sick.  Admits exposure to COVID at work.  Admits recent air travel to [place].  Admits pressure and left ear itching and right. Denies chest pain, shortness of breath, abdominal pain, vomiting, diarrhea  Past Medical History:  Diagnosis Date   Anemia    with pregnancy   Asthma    Family history of anesthesia complication    mother stopped breathing and survived    Fibromyalgia    Hiatal hernia    Migraine    tension induced    Sciatica    Stress incontinence     Patient Active Problem List   Diagnosis Date Noted   Adverse food reaction 04/18/2018   Gastroesophageal reflux disease 04/18/2018   Moderate persistent asthma, uncomplicated 02/17/2017   Seasonal and perennial allergic rhinitis 02/17/2017   Morbid obesity with body mass index of 45.0-49.9 in adult [facility]) 07/18/2015   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   ORTHOSTATIC HYPOTENSION 05/23/2009   SYNCOPE 05/23/2009   MIGRAINE HEADACHE 05/22/2009   Asthma 05/22/2009   UTI 05/22/2009   PALPITATIONS 05/22/2009   SHORTNESS OF BREATH 05/22/2009    Past Surgical History:  Procedure Laterality Date   ADENOIDECTOMY     CYSTOSCOPY N/A 01/09/2014   Procedure: CYSTOSCOPY;  Surgeon: Martina Sinner, MD;  Location: [facility];  Service: Urology;  Laterality: N/A;   HEEL SPUR SURGERY  nov. 1, 2016   tendon surgery also   HERNIA REPAIR     LAPAROSCOPIC GASTRIC SLEEVE RESECTION WITH HIATAL HERNIA REPAIR     TONSILLECTOMY      TYMPANOSTOMY TUBE PLACEMENT     URETHRAL DIVERTICULUM REPAIR N/A 01/09/2014   Procedure: Excision of URETHRAL DIVERTICULUM;  Surgeon: Martina Sinner, MD;  Location: [facility];  Service: Urology;  Laterality: N/A;   weight loss sleeve  02/15/2018    OB History     Gravida  5   Para  4   Term  4   Preterm      AB  1   Living  4      SAB      IAB      Ectopic  1   Multiple      Live Births  4            Home Medications    Prior to Admission medications   Medication Sig Start Date End Date Taking? Authorizing Provider  albuterol (PROVENTIL HFA;VENTOLIN HFA) 108 (90 Base) MCG/ACT inhaler Inhale 1-2 puffs into the lungs every 4 (four) hours as needed for wheezing or shortness of breath. For shortness of breath 04/18/18   Ellamae Sia, DO  albuterol (PROVENTIL) (2.5 MG/3ML) 0.083% nebulizer solution Take 3 mLs (2.5 mg total) by nebulization every 6 (six) hours as needed for wheezing or shortness of breath. 02/20/22   Gustavus Bryant, FNP  albuterol (PROVENTIL) (5 MG/ML) 0.5% nebulizer solution Take 0.5 mLs (2.5 mg total) by nebulization every 6 (  six) hours as needed for wheezing or shortness of breath. 04/18/18   Ellamae Sia, DO  albuterol (VENTOLIN HFA) 108 (90 Base) MCG/ACT inhaler Inhale 1-2 puffs into the lungs every 6 (six) hours as needed for wheezing or shortness of breath. 02/21/23   Mound, Acie Fredrickson, FNP  Atogepant (QULIPTA) 60 MG TABS Take 1 tablet (60 mg total) by mouth daily. PLEASE USE COPAY CARD: BIN [id number] PCN [facility] GRP [id number] ID [id number] 04/14/23   Anson Fret, MD  Fluticasone-Salmeterol (ADVAIR) 250-50 MCG/DOSE AEPB Inhale 1 puff into the lungs 2 (two) times daily. 04/18/18   Ellamae Sia, DO  methylPREDNISolone (MEDROL DOSEPAK) 4 MG TBPK tablet Take pills daily all together with food. Take the first dose (6 pills) as soon as possible. Take the rest each morning. For 6 days total 6-5-4-3-2-1. 04/14/23   Anson Fret, MD  Nebulizer System All-In-One  MISC 1 Device by Does not apply route every 6 (six) hours as needed. 02/20/22   Gustavus Bryant, FNP  omeprazole (PRILOSEC) 40 MG capsule Take 40 mg by mouth 2 (two) times daily.    [provider]  ondansetron (ZOFRAN-ODT) 8 MG disintegrating tablet Take 1 tablet (8 mg total) by mouth every 8 (eight) hours as needed for nausea or vomiting. 04/14/23   Anson Fret, MD  rizatriptan (MAXALT-MLT) 10 MG disintegrating tablet Take 1 tablet (10 mg total) by mouth as needed for migraine. May repeat in 2 hours if needed 04/14/23   Anson Fret, MD  UBRELVY 100 MG TABS Take 1 tablet (100 mg total) by mouth every 2 (two) hours as needed. Maximum 200mg  a day. PLEASE USE COPAY CARD: BIN [id number] PCN [id number] GRP [id number] ID [id number] 04/19/23   Anson Fret, MD    Family History Family History  Problem Relation Age of Onset   Heart disease Mother    Asthma Mother    COPD Mother    Stroke Father    Seizures Father    Heart disease Maternal Grandmother     Social History Social History   Tobacco Use   Smoking status: Never   Smokeless tobacco: Never  Vaping Use   Vaping status: Never Used  Substance Use Topics   Alcohol use: No   Drug use: No     Allergies   Amoxicillin, Contrast media [iodinated contrast media], Latex, Other, and Penicillins   Review of Systems Review of Systems  Constitutional:  Positive for chills, diaphoresis, fatigue and fever.  HENT:  Positive for congestion, ear pain, rhinorrhea and sore throat.   Respiratory:  Positive for cough. Negative for shortness of breath.   Cardiovascular:  Negative for chest pain.  Musculoskeletal:  Positive for myalgias.  Neurological:  Positive for headaches.     Physical Exam Triage Vital Signs ED Triage Vitals  Encounter Vitals Group     BP 06/21/23 1253 122/85     Systolic BP Percentile --      Diastolic BP Percentile --      Pulse Rate 06/21/23 1253 76     Resp 06/21/23 1253 16     Temp 06/21/23 1253 98.5  F (36.9 C)     Temp Source 06/21/23 1253 Oral     SpO2 06/21/23 1253 97 %     Weight --      Height --      Head Circumference --      Peak Flow --      Pain Score 06/21/23 1304  7     Pain Loc --      Pain Education --      Exclude from Growth Chart --    No data found.  Updated Vital Signs BP 122/85 (BP Location: Right Arm)   Pulse 76   Temp 98.5 F (36.9 C) (Oral)   Resp 16   LMP 06/20/2023   SpO2 97%   Visual Acuity Right Eye Distance:   Left Eye Distance:   Bilateral Distance:    Right Eye Near:   Left Eye Near:    Bilateral Near:     Physical Exam Vitals and nursing note reviewed.  Constitutional:      Appearance: She is not ill-appearing.  HENT:     Head: Normocephalic and atraumatic.     Right Ear: Tympanic membrane and ear canal normal.     Left Ear: Tympanic membrane and ear canal normal.     Nose: Congestion present. No rhinorrhea.     Mouth/Throat:     Mouth: Mucous membranes are moist.     Pharynx: Oropharynx is clear. No oropharyngeal exudate or posterior oropharyngeal erythema.  Eyes:     General:        Right eye: No discharge.        Left eye: No discharge.     Conjunctiva/sclera: Conjunctivae normal.  Cardiovascular:     Rate and Rhythm: Normal rate and regular rhythm.     Heart sounds: Normal heart sounds.  Pulmonary:     Effort: Pulmonary effort is normal. No respiratory distress.     Breath sounds: Normal breath sounds. No wheezing, rhonchi or rales.  Musculoskeletal:     Cervical back: Neck supple.  Lymphadenopathy:     Cervical: No cervical adenopathy.  Neurological:     Mental Status: She is alert and oriented to person, place, and time.      UC Treatments / Results  Labs (all labs ordered are listed, but only abnormal results are displayed) Labs Reviewed - No data to display  EKG   Radiology No results found.  Procedures Procedures (including critical care time)  Medications Ordered in UC Medications - No data  to display  Initial Impression / Assessment and Plan / UC Course  I have reviewed the triage vital signs and the nursing notes.  Pertinent labs & imaging results that were available during my care of the patient were reviewed by me and considered in my medical decision making (see chart for details).     43 year old female sick for 3 days well-appearing, nontoxic point-of-care COVID and flu are negative, OTC meds for symptomatic relief Final Clinical Impressions(s) / UC Diagnoses   Final diagnoses:  Acute cough   Discharge Instructions   None    ED Prescriptions   None    PDMP not reviewed this encounter.   Meliton Rattan, [place] 06/21/23 1342

## 2023-06-21 NOTE — Discharge Instructions (Signed)
 Supportive Care Medications  These are medications that may help with your symptoms. However, Please note that if your PCP has told you not to use certain products please follow his/her recommendations.  For example: - if you have high BP you should use something similar to Coricidin HPB, not Sudafed or antihistamines with a "D" such as Allegra D. The "D" means decongestant.  (Afrin is safe to use with HBP, but do not use longer than 3 days) -Higher Doses of NSAIDS (Ibuprofen, Aleve etc) with Kidney disease or poorly controlled BP.  Fever, Body aches, Headache  Ibuprofen 200 mg 2 tablets and Acetaminophen 2 tabs 4 times a day just before meals and at bedtime (every 6 hours)    Do not use NSAIDS if you are allergic to NSAIDS, if you have been given oral steroids-prednisone, methylprednisolone, dexamethasone until your steroids are finished, if you are pregnant or breast feeding, or if you have history of kidney or liver disease.   Sore Throat: Cepacol throat lozenges or spray  Cough Drops Warm salt water gargles  How to make saltwater rinses Use warm water, because warmth is more relieving to a sore throat than cold water. Warm water will also help the salt dissolve into the water more effectively. Use any type of salt you have available. Most saltwater rinse recipes call for 8 ounces of warm water and 1 teaspoon of salt. However, if your mouth is tender and the saltwater rinse stings, decrease the salt to a 1/2 teaspoon for the first 1 to 2 days. Bring water to a boil, then remove from heat, add salt, and stir. Let the saltwater cool to a warm temperature before rinsing with it. Once you have finished your rinse, discard leftover solution to avoid contamination.  Nasal Congestion: Afrin (Oxymetazoline) 1 squirt in each side of nose 2 times daily for 3 days only.  Oral decongestants like sudafed but only if you do not have high Blood Pressure if you have high Blood Pressure take CORICIDIN  HBP Antihistamines Nasal Saline/Neti Pot  Cough: Expectorants (guaifenesin) help thin mucus making it easier to get up. This should be used during the day. During the day coughing helps bring mucus up out of your lungs Suppressants (dextromethorphan) just for bedtime so you can sleep   Oral rehydration is important when you have been sweating more than usual and/or having nausea and vomiting. You can use over the counter rehydration supplements like Pedialyte sport, Gatorlyte, Electrolit, Liquid IV, or you can make your own at home. Recipe:  Mix 1 liter of clean or boiled water with 6 teaspoons (2 tablespoons) of sugar and 1/2 tsp of salt. Stir until both dissolve.  You can add sugar free flavoring (i.e. Crystal light) if desired.  Drink small sips frequently rather than large amounts at once to prevent nausea.

## 2023-09-07 ENCOUNTER — Telehealth (INDEPENDENT_AMBULATORY_CARE_PROVIDER_SITE_OTHER): Payer: BC Managed Care – PPO | Admitting: Neurology

## 2023-09-07 DIAGNOSIS — G43001 Migraine without aura, not intractable, with status migrainosus: Secondary | ICD-10-CM | POA: Diagnosis not present

## 2023-09-07 DIAGNOSIS — R252 Cramp and spasm: Secondary | ICD-10-CM | POA: Diagnosis not present

## 2023-09-07 MED ORDER — CYCLOBENZAPRINE HCL 10 MG PO TABS: 10.0000 mg | ORAL_TABLET | Freq: Every day | ORAL | 3 refills | Status: AC

## 2023-09-07 MED ORDER — METOCLOPRAMIDE HCL 10 MG PO TABS: 10.0000 mg | ORAL_TABLET | Freq: Four times a day (QID) | ORAL | 11 refills | Status: AC | PRN

## 2023-09-07 NOTE — Patient Instructions (Addendum)
 Qulipta  every night for migraine prevention Ubrelvy  for migraine attack: Please take one tablet at the onset of your headache. If it does not improve the symptoms please take one additional tablet in 2 hours. Do not take more then 2 tablets in 24hrs.  Nausea: Reglan  as needed (Metoclopramide ) Flexeril  at bedtime for muscle cramps Leave samples at front  Cyclobenzaprine  Tablets What is this medication? CYCLOBENZAPRINE  (sye kloe BEN za preen) treats muscle spasms. It works by relaxing your muscles, which reduces muscle stiffness. It belongs to a group of medications called muscle relaxants. This medicine may be used for other purposes; ask your health care provider or pharmacist if you have questions. COMMON BRAND NAME(S): Fexmid , Flexeril  What should I tell my care team before I take this medication? They need to know if you have any of these conditions: Heart disease, irregular heartbeat, or previous heart attack Liver disease Thyroid problem An unusual or allergic reaction to cyclobenzaprine , tricyclic antidepressants, lactose, other medications, foods, dyes, or preservatives Pregnant or trying to get pregnant Breastfeeding How should I use this medication? Take this medication by mouth with a glass of water . Take it as directed on the prescription label. You can take it with or without food. If it upsets your stomach, take it with food. Do not take it more often than directed. Talk to your care team about the use of this medication in children. Special care may be needed. Overdosage: If you think you have taken too much of this medicine contact a poison control center or emergency room at once. NOTE: This medicine is only for you. Do not share this medicine with others. What if I miss a dose? If you miss a dose, take it as soon as you can. If it is almost time for your next dose, take only that dose. Do not take double or extra doses. What may interact with this medication? Do not take  this medication with any of the following: MAOIs, such as Carbex, Eldepryl, Marplan, Nardil, and Parnate Opioid medications for cough Safinamide This medication may also interact with the following: Alcohol Bupropion Antihistamines for allergy , cough, and cold Certain medications for anxiety or sleep Certain medications for bladder problems, such as oxybutynin, tolterodine Certain medications for depression, such as amitriptyline, fluoxetine, sertraline Certain medications for Parkinson disease, such as benztropine, trihexyphenidyl Certain medications for seizures, such as phenobarbital, primidone Certain medications for stomach problems, such as dicyclomine, hyoscyamine Certain medications for travel sickness, such as scopolamine General anesthetics, such as halothane, isoflurane, methoxyflurane, propofol  Ipratropium Local anesthetics, such as lidocaine , pramoxine, tetracaine Medications that relax muscles for surgery Opioid medications for pain Phenothiazines, such as chlorpromazine, mesoridazine, prochlorperazine , thioridazine Verapamil This list may not describe all possible interactions. Give your health care provider a list of all the medicines, herbs, non-prescription drugs, or dietary supplements you use. Also tell them if you smoke, drink alcohol, or use illegal drugs. Some items may interact with your medicine. What should I watch for while using this medication? Visit your care team for regular checks on your progress. Tell your care team if your symptoms do not start to get better or if they get worse. This medication may affect your coordination, reaction time, or judgment. Do not drive or operate machinery until you know how this medication affects you. Sit up or stand slowly to reduce the risk of dizzy or fainting spells. Drinking alcohol with this medication can increase the risk of these side effects. Taking this medication with other substances that cause  drowsiness, such  as alcohol, benzodiazepines, or other opioids can cause serious side effects. Give your care team a list of all medications you use. They will tell you how much medication to take. Do not take more medication than directed. Call emergency services if you have problems breathing or staying awake. Your mouth may get dry. Chewing sugarless gum or sucking hard candy and drinking plenty of water  may help. Contact your care team if the problem does not go away or is severe. What side effects may I notice from receiving this medication? Side effects that you should report to your care team as soon as possible: Allergic reactions--skin rash, itching, hives, swelling of the face, lips, tongue, or throat CNS depression--slow or shallow breathing, shortness of breath, feeling faint, dizziness, confusion, trouble staying awake Heart rhythm changes--fast or irregular heartbeat, dizziness, feeling faint or lightheaded, chest pain, trouble breathing Side effects that usually do not require medical attention (report to your care team if they continue or are bothersome): Constipation Dizziness Drowsiness Dry mouth Fatigue Nausea This list may not describe all possible side effects. Call your doctor for medical advice about side effects. You may report side effects to FDA at 1-800-FDA-1088. Where should I keep my medication? Keep out of the reach of children and pets. Store at room temperature between 20 and 25 degrees C (68 and 77 degrees F). Keep container tightly closed. Get rid of any unused medication after the expiration date. To get rid of medications that are no longer needed or have expired: Take the medication to a medication take-back program. Check with your pharmacy or law enforcement to find a location. If you cannot return the medication, check the label or package insert to see if the medication should be thrown out in the garbage or flushed down the toilet. If you are not sure, ask your care team.  If it is safe to put it in the trash, empty the medication out of the container. Mix the medication with cat litter, dirt, coffee grounds, or other unwanted substance. Seal the mixture in a bag or container. Put it in the trash. NOTE: This sheet is a summary. It may not cover all possible information. If you have questions about this medicine, talk to your doctor, pharmacist, or health care provider.  2024 Elsevier/Gold Standard (2021-11-07 00:00:00)  Metoclopramide  Injection What is this medication? METOCLOPRAMIDE  (met oh kloe PRA mide) treats slow emptying of the digestive tract. It works by helping the muscles in your digestive tract move food. This empties your digestive tract, which relieves symptoms such as fullness, nausea, and heartburn. It may also be used to prevent nausea and vomiting from chemotherapy or surgery. It works by blocking substances in your body that may cause nausea and vomiting. This medicine may be used for other purposes; ask your health care provider or pharmacist if you have questions. COMMON BRAND NAME(S): Reglan  What should I tell my care team before I take this medication? They need to know if you have any of these conditions: Breast cancer Depression Diabetes Frequently drink alcohol Heart failure High blood pressure Kidney disease Liver disease Parkinson's disease or a movement disorder Pheochromocytoma Seizures Stomach obstruction, bleeding, or perforation An unusual or allergic reaction to metoclopramide , procainamide, sulfites, other medications, foods, dyes, or preservatives Pregnant or trying to get pregnant Breast-feeding How should I use this medication? This medication is for injection into a muscle or for infusion into a vein. It is given in a hospital or clinic setting. A  special MedGuide will be given to you by the pharmacist with each prescription and refill. Be sure to read this information carefully each time. Talk to your care team about  the use of this medication in children. Special care may be needed. Overdosage: If you think you have taken too much of this medicine contact a poison control center or emergency room at once. NOTE: This medicine is only for you. Do not share this medicine with others. What if I miss a dose? This does not apply. What may interact with this medication? Alcohol Antihistamines for allergy , cough, and cold Atovaquone Atropine Bupropion Certain medications for anxiety or sleep Certain medications for bladder problems, such as oxybutynin, tolterodine Certain medications for depression or mental health conditions Certain medications for Parkinson's disease Certain medications for seizures, such as phenobarbital, primidone Certain medications for stomach problems, such as dicyclomine, hyoscyamine Certain medications for travel sickness, such as scopolamine Cyclosporine Digoxin Fosfomycin General anesthetics, such as halothane, isoflurane, methoxyflurane, propofol  Insulin and other medications for diabetes Ipratropium MAOIs, such as Carbex, Eldepryl, Marplan, Nardil, and Parnate Medications that relax muscles for surgery Opioid medications for pain Paroxetine Phenothiazines, such as chlorpromazine, mesoridazine, prochlorperazine , thioridazine Posaconazole Quinidine Sirolimus Tacrolimus This list may not describe all possible interactions. Give your health care provider a list of all the medicines, herbs, non-prescription drugs, or dietary supplements you use. Also tell them if you smoke, drink alcohol, or use illegal drugs. Some items may interact with your medicine. What should I watch for while using this medication? It may take a few weeks for your stomach condition to get better. However, do not take this medication for longer than 12 weeks. The longer you take this medication, and the more you take it, the greater your chances are of developing serious side effects. If you are over 60  years of age, a female patient, or you have diabetes, you may be at an increased risk for side effects from this medication. Contact your care team immediately if you start having movements you cannot control such as lip smacking, rapid movements of the tongue, involuntary or uncontrollable movements of the eyes, head, arms and legs, or muscle twitches and spasms. Patients and their families should watch out for worsening depression or thoughts of suicide. Also watch out for any sudden or severe changes in feelings such as feeling anxious, agitated, panicky, irritable, hostile, aggressive, impulsive, severely restless, overly excited and hyperactive, or not being able to sleep. If this happens, especially at the beginning of treatment or after a change in dose, call your care team. Do not treat yourself for high fever. Ask your care team for advice. You may get drowsy or dizzy. Do not drive, use machinery, or do anything that needs mental alertness until you know how this medication affects you. Do not stand or sit up quickly, especially if you are over 60 years of age. This reduces the risk of dizzy or fainting spells. Alcohol can make you more drowsy and dizzy. Avoid alcoholic drinks. What side effects may I notice from receiving this medication? Side effects that you should report to your care team as soon as possible: Allergic reactions--skin rash, itching, hives, swelling of the face, lips, tongue, or throat High fever, stiff muscles, increased sweating, fast or irregular heartbeat, and confusion, which may be signs of neuroleptic malignant syndrome High prolactin level--unexpected breast tissue growth, discharge from the nipple, change in sex drive or performance, irregular menstrual cycle Increase in blood pressure Swelling of the ankles,  hands, or feet Thoughts of suicide or self-harm, worsening mood, feelings of depression Uncontrolled and repetitive body movements, muscle stiffness or spasms,  tremors or shaking, loss of balance or coordination, restlessness, shuffling walk, which may be signs of extrapyramidal symptoms (EPS) Side effects that usually do not require medical attention (report to your care team if they continue or are bothersome): Confusion Fatigue Headache Trouble sleeping This list may not describe all possible side effects. Call your doctor for medical advice about side effects. You may report side effects to FDA at 1-800-FDA-1088. Where should I keep my medication? This medication is given in a hospital or clinic and will not be stored at home. NOTE: This sheet is a summary. It may not cover all possible information. If you have questions about this medicine, talk to your doctor, pharmacist, or health care provider.  2024 Elsevier/Gold Standard (2021-04-24 00:00:00)

## 2023-09-07 NOTE — Progress Notes (Signed)
 [facility]    Provider:  Dr Tresia Fruit Requesting Provider: Hancock, Melinda Frazie* Primary Care Provider:  Ritta Chessman, PA-C  CC:  migraines  Virtual Visit via Video Note  I connected with [name] on 09/08/23 at  3:00 PM EDT by a video enabled telemedicine application and verified that I am speaking with the correct person using two identifiers.  Location: Patient: her place of work Provider: provider's office   I discussed the limitations of evaluation and management by telemedicine and the availability of in person appointments. The patient expressed understanding and agreed to proceed.   Follow Up Instructions:    I discussed the assessment and treatment plan with the patient. The patient was provided an opportunity to ask questions and all were answered. The patient agreed with the plan and demonstrated an understanding of the instructions.   The patient was advised to call back or seek an in-person evaluation if the symptoms worsen or if the condition fails to improve as anticipated.  I provided 30 minutes of non-face-to-face time during this encounter.   Melinda Larsen, MD   09/07/2023: She was having problems getting qulipta . She thinks it was working. Had side effects to to the rizatriptan  but the ubrelvy  helped. Rizatriptan  had side effects, significant symptoms for 25 minutes, nerve pain and flushing, neck pain and spasms, triptans contraindicated. [name], associated symptoms, [name] . Pertinent negatives and positives per HPI. All others negative     HPI 03/2023:  [name] is a 43 y.o. female here as requested by Hancock, Melinda Frazie* for migraines.  I reviewed her epic chart, she saw my colleague back in 2015 however it was for pain  after recent vaginal birth, sharp pain in the lumbar region radiating up to her head.  Today she is here for migraines.  She was seen in the ED in July past medical history of asthma and fibromyalgia presenting with migraine type headache with associated nausea and photophobia.  She reported a history of migraines, they checked a CTV which was negative, on reassessment the headache had resolved.  Headaches on the right side moved to the left, throbbing sharp burning associated nausea and photophobia phonophobia.  They started on a headache cocktail.  Low suspicion for secondary headache, headache was also not thunderclap in nature. She uses birth control no more kids.   Headaches on the right side right parietal then can spread to the left, pulsating/poundng/throbbing, light and sound and smell sensitivity and sometimes trigger a migraine any odor can trigger, hurts to move, nausea and vomiting. She also feels "heavy" she felt her head heavy. She also has neck tightness. Last migraine was so bad she slept for 4 days was 10/10 back in the ED in July, acute. She has had 7/10 migraines since then.   She has morning headaches and often can wake her up. Husband says she snores,she stops breathing, wakes frequently she tries to take benadril or tylenol  pm otherwise she is up all night. Dry mouth in the morning. She is tired all day, wants to take a nap, never feels refereshed.  She had an endoscopy and she was told she stopped breathing and he had to hold her neck open to breathe and anesthesiologist stated she should get test for sleep apnea. 12 headache days a month.  6 migraine days a month that are moderate to severe and last all day. Even washing her hair can trigger a migraine. No aura. No medication overuse.  She has been having severe headaches at this freq and severity > 3 months. First had a migraine from 73s, daughter has migraines and father had headaches.   Medications tried > 3 months: propranolol  contraindicated due to asthma and also low BP 116 systolic today would not give any blood pressure meds due to risk of hypotension so contraindacted, ibuprofen ,tylenol , solumedrol, cymbalta , lyica, gabapentin , amitriptyline, topiramate, nortriptyline, sumatriptan, maxalt . Aimovig contraindicated die to constipation. Cannot take Ajovy or emgality  Rizatriptan  had side effects, significant symptoms for 25 minutes, nerve pain and flushing, neck pain and spasms, triptans contraindicated, ondansetron  ineffective,   Patient states that she has migraines associated with pulsating pounding throbbing quality, unilateral, associated nausea and photophobia, phonophobia, hurts to move, can be moderate to severe and can last 4 to 24 hours.    Reviewed notes, labs and imaging from outside physicians, which showed:  Following with endocrinology for thyroid nodule. TSH has been normal (reviewed from 2023).     Latest Ref Rng & Units 12/10/2022   12:50 PM 04/19/2018   10:10 AM 09/22/2017   12:11 PM  CBC  WBC 4.0 - 10.5 K/uL 5.0  5.5  6.7   Hemoglobin 12.0 - 15.0 g/dL 16.1  09.6  04.5   Hematocrit 36.0 - 46.0 % 37.5  41.4  38.7   Platelets 150 - 400 K/uL 265  254  269        Latest Ref Rng & Units 12/10/2022   12:50 PM 04/19/2018   12:32 PM 04/19/2018   10:10 AM  CMP  Glucose 70 - 99 mg/dL 90   99   BUN 6 - 20 mg/dL 16   8   Creatinine 4.09 - 1.00 mg/dL 8.11   9.14   Sodium 782 - 145 mmol/L 137   138   Potassium 3.5 - 5.1 mmol/L 4.3   3.0   Chloride 98 - 111 mmol/L 104   102   CO2 22 - 32 mmol/L 25   23   Calcium 8.9 - 10.3 mg/dL 8.8   9.3   Total Protein 6.5 - 8.1 g/dL  6.7    Total Bilirubin 0.3 - 1.2 mg/dL  0.6    Alkaline Phos 38 - 126 U/L  56    AST 15 - 41 U/L  18    ALT 0 - 44 U/L  15       12/10/2022: CTV CLINICAL DATA:  Provided history: Dural venous sinus thrombosis suspected. Headaches. Nausea. Photophobia.  MRi brain in 2015 for migraines (same symptoms):  TUDY DATE:  06/18/2013 PATIENT  NAME: [name] DOB: 11-15-1980 MRN: [id number]  ORDERING CLINICIAN:  Dr Anell Keep CLINICAL HISTORY: 32 year patient with headache post partum COMPARISON FILMS:  none EXAM:  MRI Brain wo TECHNIQUE: MRI of the brain without contrast was obtained utilizing 5 mm axial slices with T1, T2, T2 flair, T2 star gradient echo and diffusion weighted views.  T1 sagittal and T2 coronal views were obtained. CONTRAST: none IMAGING SITE: [facility] FINDINGS: The brain parenchyma shows no abnormal areas of signal intensities. No structural lesion, tumor or infarcts are noted. There is a small cyst of velum interpositum  noted.No abnormal lesions are seen on diffusion-weighted views to suggest acute ischemia. The cortical sulci, fissures and cisterns are normal in size and appearance. Lateral, third and  fourth ventricle are normal in size and appearance. No extra-axial fluid collections are seen. No evidence of mass effect or midline shift.   On sagittal views the posterior fossa, pituitary gland and corpus callosum are unremarkable. No evidence of intracranial hemorrhage on gradient-echo views. The orbits and their contents,  and calvarium are unremarkable. The paranasal sinuses show mild chronic inflammatory changes.  Intracranial flow voids are present.      Impression   Unremarkable MRI scan of the brain without contrast   EXAM: CT HEAD WITHOUT CONTRAST   TECHNIQUE: Contiguous axial images were obtained from the base of the skull through the vertex without intravenous contrast.   RADIATION DOSE REDUCTION: This exam was performed according to the departmental dose-optimization program which includes automated exposure control, adjustment of the mA and/or kV according to patient size and/or use of iterative reconstruction technique.   COMPARISON:  MRI brain, MRA head and MRV head 06/18/2013.   FINDINGS: NON-CONTRAST HEAD CT:   Brain:   Cerebral volume is normal.   There  is no acute intracranial hemorrhage.   No demarcated cortical infarct.   No extra-axial fluid collection.   No evidence of an intracranial mass.   No midline shift.   Vascular: No hyperdense vessel.   Skull: No calvarial fracture or aggressive osseous lesion.   Sinuses/Orbits: No mass or acute finding within the imaged orbits. No significant paranasal sinus disease at the imaged levels.   CT VENOGRAM HEAD:   The superior sagittal sinus, internal cerebral veins, vein of Galen, straight sinus, transverse sinuses, sigmoid sinuses and visualized jugular veins are patent. There is no appreciable intracranial venous thrombosis.   IMPRESSION: 1. Unremarkable non-contrast CT appearance of the brain. No evidence of an acute intracranial abnormality. 2. No evidence of dural venous sinus thrombosis on CT venogram.    Review of Systems: Patient complains of symptoms per HPI as well as the following symptoms none. Pertinent negatives and positives per HPI. All others negative.   Social History   Socioeconomic History   Marital status: Married    Spouse name: Advice worker   Number of children: 4   Years of education: college   Highest education level: Not on file  Occupational History   Occupation: TEACHER    Employer: [employer]  Tobacco Use   Smoking status: Never   Smokeless tobacco: Never  Vaping Use   Vaping status: Never Used  Substance and Sexual Activity   Alcohol use: No   Drug use: No   Sexual activity: Yes    Birth control/protection: None  Other Topics Concern   Not on file  Social History Narrative   Patient lives at home with her husband Production manager) and 4 children   Patient is left handed   Education some college   Caffeine consumption if any 1 a day   Social Drivers of Corporate investment banker Strain: Not on file  Food Insecurity: No Food Insecurity (05/29/2021)   [facility]   Hunger Vital Sign    Worried  About Running Out of Food in the Last Year: Never true    Ran Out of Food in the Last Year: Never true  Transportation Needs: Not on file  Physical Activity: Not on file  Stress: Not on file  Social Connections: Unknown (09/22/2021)   Received from [facility]   Social Network    Social Network: Not on file  Intimate Partner Violence: Unknown (08/22/2021)  [facility], [facility]   HITS    Physically Hurt: Not on file    Insult or Talk Down To: Not on file    Threaten Physical Harm: Not on file    Scream or Curse: Not on file    Family History  Problem Relation Age of Onset   Heart disease Mother    Asthma Mother    COPD Mother    Stroke Father    Seizures Father    Heart disease Maternal Grandmother     Past Medical History:  Diagnosis Date   Anemia    with pregnancy   Asthma    Family history of anesthesia complication    mother stopped breathing and survived    Fibromyalgia    Hiatal hernia    Migraine    tension induced    Sciatica    Stress incontinence     Patient Active Problem List   Diagnosis Date Noted   Muscle cramps 09/07/2023   Adverse food reaction 04/18/2018   Gastroesophageal reflux disease 04/18/2018   Moderate persistent asthma, uncomplicated 02/17/2017   Seasonal and perennial allergic rhinitis 02/17/2017   Morbid obesity with body mass index of 45.0-49.9 in adult [facility]) 07/18/2015   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 1, 2016   tendon surgery also   HERNIA REPAIR     LAPAROSCOPIC  GASTRIC SLEEVE RESECTION WITH HIATAL HERNIA REPAIR     TONSILLECTOMY     TYMPANOSTOMY TUBE PLACEMENT     URETHRAL DIVERTICULUM REPAIR N/A 01/09/2014   Procedure: Excision of URETHRAL DIVERTICULUM;  Surgeon: Devorah Fonder, MD;  Location: [facility];  Service: Urology;  Laterality: N/A;   weight loss sleeve  02/15/2018    Current Outpatient Medications  Medication Sig Dispense Refill   Atogepant  (QULIPTA ) 60 MG TABS Take 1 tablet (60 mg total) by mouth daily. 30 tablet 11   cyclobenzaprine  (FLEXERIL ) 10 MG tablet Take 1 tablet (10 mg total) by mouth at bedtime. For muscle spasms/neck and low back muscle pain. 90 tablet 3   metoCLOPramide  (REGLAN ) 10 MG tablet Take 1 tablet (10 mg total) by mouth every 6 (six) hours as needed for nausea. 30 tablet 11   Ubrogepant  (UBRELVY ) 100 MG TABS Take 1 tablet (100 mg total) by mouth every 2 (two) hours as needed. Maximum 200mg  a day. 16 tablet 11   albuterol  (PROVENTIL  HFA;VENTOLIN  HFA) 108 (90 Base) MCG/ACT inhaler Inhale 1-2 puffs into the lungs every 4 (four) hours as needed for wheezing or shortness of breath. For shortness of breath 3.7 g 2   albuterol  (PROVENTIL ) (2.5 MG/3ML) 0.083% nebulizer solution Take 3 mLs (2.5 mg total) by nebulization every 6 (six) hours as needed for wheezing or shortness of breath. 75 mL 12   albuterol  (PROVENTIL ) (5 MG/ML) 0.5% nebulizer solution Take 0.5 mLs (2.5 mg total) by nebulization every 6 (six) hours as needed for wheezing or shortness of breath. 20 mL 12   albuterol  (VENTOLIN  HFA) 108 (90 Base) MCG/ACT inhaler Inhale 1-2 puffs into the lungs  every 6 (six) hours as needed for wheezing or shortness of breath. 1 each 0   Fluticasone -Salmeterol (ADVAIR) 250-50 MCG/DOSE AEPB Inhale 1 puff into the lungs 2 (two) times daily. 60 each 5   methylPREDNISolone  (MEDROL  DOSEPAK) 4 MG TBPK tablet Take pills daily all together with food. Take the first dose (6 pills) as soon as possible. Take the rest each morning. For 6 days total  6-5-4-3-2-1. 21 tablet 1   Nebulizer System All-In-One MISC 1 Device by Does not apply route every 6 (six) hours as needed. 1 each 0   omeprazole (PRILOSEC) 40 MG capsule Take 40 mg by mouth 2 (two) times daily.     ondansetron  (ZOFRAN -ODT) 8 MG disintegrating tablet Take 1 tablet (8 mg total) by mouth every 8 (eight) hours as needed for nausea or vomiting. 30 tablet 11   rizatriptan  (MAXALT -MLT) 10 MG disintegrating tablet Take 1 tablet (10 mg total) by mouth as needed for migraine. May repeat in 2 hours if needed 9 tablet 11   No current facility-administered medications for this visit.    Allergies as of 09/07/2023 - Review Complete 06/21/2023  Allergen Reaction Noted   Amoxicillin Hives    Contrast media [iodinated contrast media] Hives and Itching 02/17/2017   Latex Other (See Comments)    Other Other (See Comments) 06/20/2021   Penicillins Hives     Vitals: There were no vitals taken for this visit. Last Weight:  Wt Readings from Last 1 Encounters:  04/14/23 171 lb (77.6 kg)   Last Height:   Ht Readings from Last 1 Encounters:  04/14/23 5' 1.5" (1.562 m)     Physical exam: Exam: Gen: NAD, conversant      CV: No palpitations or chest pain or SOB. VS: Breathing at a normal rate. Weight appears within normal limits. Not febrile. Eyes: Conjunctivae clear without exudates or hemorrhage  Neuro: Detailed Neurologic Exam  Speech:    Speech is normal; fluent and spontaneous with normal comprehension.  Cognition:    The patient is oriented to person, place, and time;     recent and remote memory intact;     language fluent;     normal attention, concentration, fund of knowledge Cranial Nerves:    The pupils are equal, round, and reactive to light. Visual fields are full Extraocular movements are intact.  The face is symmetric with normal sensation. The palate elevates in the midline. Hearing intact. Voice is normal. Shoulder shrug is normal. The tongue has normal motion  without fasciculations.   Coordination: normal  Gait:    No abnormalities noted or reported  Motor Observation:   no involuntary movements noted. Tone:    Appears normal  Posture:    Posture is normal. normal erect    Strength:    Strength is anti-gravity and symmetric in the upper and lower limbs.      Sensation: intact to LT, no reports of numbness or tingling or paresthesias         Assessment/Plan:  Patient with migraines  Prevention: Start Qulipta  daily. If ineffective go to Ajovy or Emgality At onset of migraines: Try Ubrelvy  and/or Maxalt (riztriptan). May also take ondansetron . May take one or all together, experiment.   Ubrelvy : Please take one tablet at the onset of your headache. If it does not improve the symptoms please take an additional tablet in 2 hours. Do not take more then 2 tablets in 24hrs. Do not take use more then 2 to 3 times in a week.  Nausea: reglan  (zofran  ineffective)  Flexeril : muscle cramps at night  Did not get sleep test: She has morning headaches and often can wake her up. Husband says she snores,she stops breathing, wakes frequently she tries to take benadril or tylenol  pm otherwise she is up all night. Dry mouth in the morning. She is tired all day, wants to take a nap, never feels refereshed.  She had an endoscopy and she was told she stopped breathing and he had to hold her neck open to breathe and anesthesiologist stated she should get test for sleep apnea, never refreshed, low energy.  No orders of the defined types were placed in this encounter.  Meds ordered this encounter  Medications   cyclobenzaprine  (FLEXERIL ) 10 MG tablet    Sig: Take 1 tablet (10 mg total) by mouth at bedtime. For muscle spasms/neck and low back muscle pain.    Dispense:  90 tablet    Refill:  3   metoCLOPramide  (REGLAN ) 10 MG tablet    Sig: Take 1 tablet (10 mg total) by mouth every 6 (six) hours as needed for nausea.    Dispense:  30 tablet    Refill:   11   Atogepant  (QULIPTA ) 60 MG TABS    Sig: Take 1 tablet (60 mg total) by mouth daily.    Dispense:  30 tablet    Refill:  11   Ubrogepant  (UBRELVY ) 100 MG TABS    Sig: Take 1 tablet (100 mg total) by mouth every 2 (two) hours as needed. Maximum 200mg  a day.    Dispense:  16 tablet    Refill:  11    Cc: Hancock, [address]*,  [place], [place], [place]  Aldona Amel, MD  [facility] [address] [place], [place] [address]  Phone [phone] Fax [phone]  I spent over 60 minutes of face-to-face and non-face-to-face time with patient on the  1. Migraine without aura and with status migrainosus, not intractable   2. Muscle cramps     diagnosis.  This included previsit chart review, lab review, study review, order entry, electronic health record documentation, patient education on the different diagnostic and therapeutic options, counseling and coordination of care, risks and benefits of management, compliance, or risk factor reduction

## 2023-09-08 ENCOUNTER — Telehealth: Payer: Self-pay | Admitting: Neurology

## 2023-09-08 ENCOUNTER — Encounter: Payer: Self-pay | Admitting: Neurology

## 2023-09-08 MED ORDER — UBRELVY 100 MG PO TABS: 100.0000 mg | ORAL_TABLET | ORAL | 11 refills | Status: AC | PRN

## 2023-09-08 MED ORDER — QULIPTA 60 MG PO TABS: 60.0000 mg | ORAL_TABLET | Freq: Every day | ORAL | 11 refills | Status: AC

## 2023-09-08 NOTE — Telephone Encounter (Signed)
 Please call patient and schedule f/u 6 months with NP please video medication check

## 2023-09-09 ENCOUNTER — Telehealth: Payer: Self-pay | Admitting: *Deleted

## 2023-09-09 MED ORDER — QULIPTA 60 MG PO TABS: 60.0000 mg | ORAL_TABLET | Freq: Every day | ORAL | Status: AC

## 2023-09-09 MED ORDER — UBRELVY 100 MG PO TABS: ORAL_TABLET | ORAL | 0 refills | Status: AC

## 2023-09-09 NOTE — Telephone Encounter (Signed)
 Samples of qulipta  and ubrelvy  placed up front for pt pick up.

## 2023-09-09 NOTE — Progress Notes (Signed)
 Placed samples up front for pt to pick up. Qulipta  60mg  tabs #16 NDC 16109-60454, LOT [id number] exp 5/27.  Ubrelvy  100mg  tabs #4 NDC 4782-9562-13 LOT [id number] exp 12/26.

## 2023-09-09 NOTE — Addendum Note (Signed)
 Addended by: Viktoria Gray on: 09/09/2023 08:23 AM   Modules accepted: Orders

## 2023-09-13 ENCOUNTER — Other Ambulatory Visit (HOSPITAL_COMMUNITY): Payer: Self-pay

## 2023-09-13 ENCOUNTER — Telehealth: Payer: Self-pay

## 2023-09-13 NOTE — Telephone Encounter (Signed)
 Pharmacy Patient Advocate Encounter   Received notification from CoverMyMeds that prior authorization for Qulipta  60MG  tablets is required/requested.   Insurance verification completed.   The patient is insured through [employer] [facility] .   Per test claim: PA required; PA started via CoverMyMeds. KEY [id number] . Waiting for clinical questions to populate.

## 2023-09-16 NOTE — Telephone Encounter (Signed)
 Previous PA expired-resubmitted Pharmacy Patient Advocate Encounter   Received notification from CoverMyMeds that prior authorization for Qulipta  60MG  tablets is required/requested.   Insurance verification completed.   The patient is insured through [employer] [facility] .   Per test claim: PA required; PA submitted to above mentioned insurance via CoverMyMeds Key/confirmation #/[id number] Status is pending

## 2023-09-17 ENCOUNTER — Other Ambulatory Visit (HOSPITAL_COMMUNITY): Payer: Self-pay

## 2023-09-17 NOTE — Telephone Encounter (Signed)
 Pharmacy Patient Advocate Encounter  Received notification from [employer] that Prior Authorization for Qulipta  60MG  tablets has been APPROVED from 09/17/2023 to 12/18/2023. Ran test claim, Copay is $0. This test claim was processed through [facility]- copay amounts may vary at other pharmacies due to pharmacy/plan contracts, or as the patient moves through the different stages of their insurance plan.   PA #/Case ID/Reference #: PA Case ID #: [id number]

## 2023-09-23 ENCOUNTER — Ambulatory Visit
Admission: RE | Admit: 2023-09-23 | Discharge: 2023-09-23 | Disposition: A | Discharge status: Home / Self Care | Payer: Self-pay | Source: Ambulatory Visit | Attending: Family Medicine | Admitting: Family Medicine

## 2023-09-23 VITALS — BP 121/80 | HR 75 | Temp 98.7°F | Resp 18 | Wt 171.1 lb

## 2023-09-23 DIAGNOSIS — H5789 Other specified disorders of eye and adnexa: Secondary | ICD-10-CM | POA: Diagnosis not present

## 2023-09-23 MED ORDER — TOBRAMYCIN 0.3 % OP SOLN: 1.0000 [drp] | Freq: Four times a day (QID) | OPHTHALMIC | 0 refills | Status: DC

## 2023-09-23 NOTE — ED Provider Notes (Signed)
 [facility]   [id number] 09/23/23 Arrival Time: 1700  ASSESSMENT & PLAN:  1. Irritation of right eye    Question forming hordeolum of R upper medial lid. Begin: Meds ordered this encounter  Medications   tobramycin (TOBREX) 0.3 % ophthalmic solution    Sig: Place 1 drop into the right eye every 6 (six) hours.    Dispense:  5 mL    Refill:  0   Warm compress to eye(s). Local eye care discussed.  May f/u here if not improving.  Reviewed expectations re: course of current medical issues. Questions answered. Outlined signs and symptoms indicating need for more acute intervention. Patient verbalized understanding. After Visit Summary given.   SUBJECTIVE:  LEMMA TRAINER is a 43 y.o. female who presents with complaint of irritation of R eye/upper eyelid. Noted 3 d ago; stable. Denies eye trauma/vision changes. Denies light sensitivity.  OBJECTIVE:  Vitals:   09/23/23 1716 09/23/23 1717  BP:  121/80  Pulse:  75  Resp:  18  Temp:  98.7 F (37.1 C)  TempSrc:  Oral  SpO2:  98%  Weight: 77.6 kg     General appearance: alert; no distress HEENT: [place]; AT; PERRLA; no restriction of the extraocular movements OS: normal OD: without reported pain; without conjunctival injection; with watery drainage; without gross corneal opacities; without limbal flush; without periorbital swelling or erythema; upper medial eyelid is slightly erythematous and swollen at eyelashes Neck: supple without LAD Skin: warm and dry Psychological: alert and cooperative; normal mood and affect    Allergies  Allergen Reactions   Amoxicillin Hives    Has patient had a PCN reaction causing immediate rash, facial/tongue/throat swelling, SOB or lightheadedness with hypotension: Yes Has patient had a PCN reaction causing severe rash involving mucus membranes or skin necrosis: Yes Has patient had a PCN reaction that required hospitalization No Has patient had a PCN reaction occurring within the  last 10 years: No If all of the above answers are "NO", then may proceed with Cephalosporin use.    Contrast Media [Iodinated Contrast Media] Hives and Itching   Latex Other (See Comments)    Burns and blisters skin   Other Other (See Comments)    antidepressants   Penicillins Hives    Has patient had a PCN reaction causing immediate rash, facial/tongue/throat swelling, SOB or lightheadedness with hypotension: Yes Has patient had a PCN reaction causing severe rash involving mucus membranes or skin necrosis: Yes Has patient had a PCN reaction that required hospitalization No Has patient had a PCN reaction occurring within the last 10 years: No If all of the above answers are "NO", then may proceed with Cephalosporin use.     Past Medical History:  Diagnosis Date   Anemia    with pregnancy   Asthma    Family history of anesthesia complication    mother stopped breathing and survived    Fibromyalgia    Hiatal hernia    Migraine    tension induced    Sciatica    Stress incontinence    Social History   Socioeconomic History   Marital status: Married    Spouse name: [place]   Number of children: 4   Years of education: college   Highest education level: Not on file  Occupational History   Occupation: TEACHER    Employer: [employer]  Tobacco Use   Smoking status: Never    Passive exposure: Never   Smokeless tobacco: Never  Vaping Use  Vaping status: Never Used  Substance and Sexual Activity   Alcohol use: No   Drug use: No   Sexual activity: Yes    Birth control/protection: None  Other Topics Concern   Not on file  Social History Narrative   Patient lives at home with her husband Production manager) and 4 children   Patient is left handed   Education some college   Caffeine consumption if any 1 a day   Social Drivers of Corporate investment banker Strain: Not on file  Food Insecurity: No Food Insecurity (05/29/2021)   Received [facility], [facility]   Hunger Vital Sign    Worried About Running Out of Food in the Last Year: Never true    Ran Out of Food in the Last Year: Never true  Transportation Needs: Not on file  Physical Activity: Not on file  Stress: Not on file  Social Connections: Unknown (09/22/2021)   Received from [facility], [facility]   Social Network    Social Network: Not on file  Intimate Partner Violence: Unknown (08/22/2021)   Received from [facility], [facility]   HITS    Physically Hurt: Not on file    Insult or Talk Down To: Not on file    Threaten Physical Harm: Not on file    Scream or Curse: Not on file   Family History  Problem Relation Age of Onset   Heart disease Mother    Asthma Mother    COPD Mother    Stroke Father    Seizures Father    Heart disease Maternal Grandmother    Past Surgical History:  Procedure Laterality Date   ADENOIDECTOMY     CYSTOSCOPY N/A 01/09/2014   Procedure: CYSTOSCOPY;  Surgeon: Devorah Fonder, MD;  Location: [facility];  Service: Urology;  Laterality: N/A;   HEEL SPUR SURGERY  nov. 1, 2016   tendon surgery also   HERNIA REPAIR     LAPAROSCOPIC GASTRIC SLEEVE RESECTION WITH HIATAL HERNIA REPAIR     TONSILLECTOMY     TYMPANOSTOMY TUBE PLACEMENT     URETHRAL DIVERTICULUM REPAIR N/A 01/09/2014   Procedure: Excision of URETHRAL DIVERTICULUM;  Surgeon: Devorah Fonder, MD;  Location: [facility];  Service: Urology;  Laterality: N/A;   weight loss sleeve  02/15/2018      Afton Albright, MD 09/23/23 1821

## 2023-09-23 NOTE — ED Triage Notes (Signed)
 Eye pain and swelling since 09/20/2023 - Entered by patient  Pt presents c/o eye pain in right eye. She says there is itching and slight pain.

## 2023-10-08 ENCOUNTER — Telehealth: Admitting: Physician Assistant

## 2023-10-08 DIAGNOSIS — R3989 Other symptoms and signs involving the genitourinary system: Secondary | ICD-10-CM

## 2023-10-08 MED ORDER — CEPHALEXIN 500 MG PO CAPS: 500.0000 mg | ORAL_CAPSULE | Freq: Two times a day (BID) | ORAL | 0 refills | Status: AC

## 2023-10-08 NOTE — Patient Instructions (Signed)
 Melinda Hsu Sabol, thank you for joining Marciana Settle, PA-C for today's virtual visit.  While this provider is not your primary care provider (PCP), if your PCP is located in our provider database this encounter information will be shared with them immediately following your visit.   A [facility] MyChart account gives you access to today's visit and all your visits, tests, and labs performed at [facility] " click here if you don't have a [place] [facility] MyChart account or go to [email or web address]  Consent: (Patient) [name] provided verbal consent for this virtual visit at the beginning of the encounter.  Current Medications:  Current Outpatient Medications:    cephALEXin  (KEFLEX ) 500 MG capsule, Take 1 capsule (500 mg total) by mouth 2 (two) times daily for 7 days., Disp: 14 capsule, Rfl: 0   albuterol  (PROVENTIL  HFA;VENTOLIN  HFA) 108 (90 Base) MCG/ACT inhaler, Inhale 1-2 puffs into the lungs every 4 (four) hours as needed for wheezing or shortness of breath. For shortness of breath, Disp: 3.7 g, Rfl: 2   albuterol  (PROVENTIL ) (2.5 MG/3ML) 0.083% nebulizer solution, Take 3 mLs (2.5 mg total) by nebulization every 6 (six) hours as needed for wheezing or shortness of breath., Disp: 75 mL, Rfl: 12   albuterol  (PROVENTIL ) (5 MG/ML) 0.5% nebulizer solution, Take 0.5 mLs (2.5 mg total) by nebulization every 6 (six) hours as needed for wheezing or shortness of breath., Disp: 20 mL, Rfl: 12   albuterol  (VENTOLIN  HFA) 108 (90 Base) MCG/ACT inhaler, Inhale 1-2 puffs into the lungs every 6 (six) hours as needed for wheezing or shortness of breath., Disp: 1 each, Rfl: 0   Atogepant  (QULIPTA ) 60 MG TABS, Take 1 tablet (60 mg total) by mouth daily., Disp: 30 tablet, Rfl: 11   Atogepant  (QULIPTA ) 60 MG TABS, Take 1 tablet (60 mg total) by mouth daily. NDC 40981-19147, LOT [id number] exp 5/27., Disp: , Rfl:    cyclobenzaprine  (FLEXERIL ) 10 MG tablet, Take 1 tablet (10 mg total) by mouth  at bedtime. For muscle spasms/neck and low back muscle pain., Disp: 90 tablet, Rfl: 3   DULoxetine  (CYMBALTA ) 30 MG capsule, Take 30 mg by mouth., Disp: , Rfl:    Fluticasone -Salmeterol (ADVAIR) 250-50 MCG/DOSE AEPB, Inhale 1 puff into the lungs 2 (two) times daily., Disp: 60 each, Rfl: 5   methylPREDNISolone  (MEDROL  DOSEPAK) 4 MG TBPK tablet, Take pills daily all together with food. Take the first dose (6 pills) as soon as possible. Take the rest each morning. For 6 days total 6-5-4-3-2-1., Disp: 21 tablet, Rfl: 1   metoCLOPramide  (REGLAN ) 10 MG tablet, Take 1 tablet (10 mg total) by mouth every 6 (six) hours as needed for nausea., Disp: 30 tablet, Rfl: 11   Nebulizer System All-In-One MISC, 1 Device by Does not apply route every 6 (six) hours as needed., Disp: 1 each, Rfl: 0   omeprazole (PRILOSEC) 40 MG capsule, Take 40 mg by mouth 2 (two) times daily., Disp: , Rfl:    ondansetron  (ZOFRAN -ODT) 8 MG disintegrating tablet, Take 1 tablet (8 mg total) by mouth every 8 (eight) hours as needed for nausea or vomiting., Disp: 30 tablet, Rfl: 11   rizatriptan  (MAXALT -MLT) 10 MG disintegrating tablet, Take 1 tablet (10 mg total) by mouth as needed for migraine. May repeat in 2 hours if needed, Disp: 9 tablet, Rfl: 11   tiZANidine  (ZANAFLEX ) 4 MG tablet, Take 4 mg by mouth., Disp: , Rfl:    tobramycin  (TOBREX ) 0.3 % ophthalmic solution, Place 1 drop into the  right eye every 6 (six) hours., Disp: 5 mL, Rfl: 0   Ubrogepant  (UBRELVY ) 100 MG TABS, Take 1 tablet (100 mg total) by mouth every 2 (two) hours as needed. Maximum 200mg  a day., Disp: 16 tablet, Rfl: 11   Ubrogepant  (UBRELVY ) 100 MG TABS, Take 1 tablet onset migraine, may take another 2 hours later if needed. NDC 9604-5409-81 LOT [id number] exp 12/26., Disp: 4 tablet, Rfl: 0   Medications ordered in this encounter:  Meds ordered this encounter  Medications   cephALEXin (KEFLEX) 500 MG capsule    Sig: Take 1 capsule (500 mg total) by mouth 2 (two) times  daily for 7 days.    Dispense:  14 capsule    Refill:  0    Supervising Provider:   Corine Dice [[id number]]     *If you need refills on other medications prior to your next appointment, please contact your pharmacy*  Follow-Up: Call back or seek an in-person evaluation if the symptoms worsen or if the condition fails to improve as anticipated.  [facility] [phone]  Other Instructions Please report to the nearest Emergency room with any worsening symptoms. Follow up with primary care provider (PCP) in 2 -3 days.    If you have been instructed to have an in-person evaluation today at a local Urgent Care facility, please use the link below. It will take you to a list of all of our available [facility], including address, phone number and hours of operation. Please do not delay care.  [facility]  If you or a family member do not have a primary care provider, use the link below to schedule a visit and establish care. When you choose a [facility] primary care physician or advanced practice provider, you gain a long-term partner in health. Find a Primary Care Provider  Learn more about [facility]'s in-office and virtual care options: [facility] - Get Care Now

## 2023-10-08 NOTE — Progress Notes (Signed)
 Virtual Visit Consent   [name], you are scheduled for a virtual visit with a [facility] provider today. Just as with appointments in the office, your consent must be obtained to participate. Your consent will be active for this visit and any virtual visit you may have with one of our providers in the next 365 days. If you have a MyChart account, a copy of this consent can be sent to you electronically.  As this is a virtual visit, video technology does not allow for your provider to perform a traditional examination. This may limit your provider's ability to fully assess your condition. If your provider identifies any concerns that need to be evaluated in person or the need to arrange testing (such as labs, EKG, etc.), we will make arrangements to do so. Although advances in technology are sophisticated, we cannot ensure that it will always work on either your end or our end. If the connection with a video visit is poor, the visit may have to be switched to a telephone visit. With either a video or telephone visit, we are not always able to ensure that we have a secure connection.  By engaging in this virtual visit, you consent to the provision of healthcare and authorize for your insurance to be billed (if applicable) for the services provided during this visit. Depending on your insurance coverage, you may receive a charge related to this service.  I need to obtain your verbal consent now. Are you willing to proceed with your visit today? [name] has provided verbal consent on 10/08/2023 for a virtual visit (video or telephone). Marciana Settle, [place]  Date: 10/08/2023 2:10 PM   Virtual Visit via Video Note   I, Raushanah Osmundson Mitzie Anda, connected with  CANDENCE SEASE  ([id number], [birth year]) on 10/08/23 at  2:00 PM EDT by a video-enabled telemedicine application and verified that I am speaking with the correct person using two identifiers.  Location: Patient: Virtual Visit Location  Patient: Home Provider: Virtual Visit Location Provider: Home Office   I discussed the limitations of evaluation and management by telemedicine and the availability of in person appointments. The patient expressed understanding and agreed to proceed.    History of Present Illness: [name] is a 43 y.o. who identifies as a female who was assigned female at birth, and is being seen today for UTI.  HPI: Urinary Tract Infection  This is a new problem. The current episode started yesterday. The problem occurs every urination. The pain is at a severity of 0/10. The patient is experiencing no pain. There has been no fever. She is Not sexually active. There is No history of pyelonephritis. Associated symptoms include frequency. She has tried nothing for the symptoms. The treatment provided no relief. Her past medical history is significant for recurrent UTIs.    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   GERD without esophagitis 05/17/2018   Adverse food reaction 04/18/2018   Gastroesophageal reflux disease 04/18/2018   Moderate persistent asthma, uncomplicated 02/17/2017   Seasonal and perennial allergic rhinitis 02/17/2017   Morbid obesity with body mass index of 45.0-49.9 in adult [facility]) 07/18/2015   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   ORTHOSTATIC HYPOTENSION 05/23/2009   SYNCOPE 05/23/2009   MIGRAINE HEADACHE 05/22/2009  Asthma 05/22/2009   UTI 05/22/2009   PALPITATIONS 05/22/2009   SHORTNESS OF BREATH 05/22/2009    Allergies:  Allergies  Allergen Reactions   Amoxicillin Hives    Has patient had a PCN reaction causing immediate rash, facial/tongue/throat swelling, SOB or lightheadedness with hypotension: Yes Has patient had a PCN  reaction causing severe rash involving mucus membranes or skin necrosis: Yes Has patient had a PCN reaction that required hospitalization No Has patient had a PCN reaction occurring within the last 10 years: No If all of the above answers are "NO", then may proceed with Cephalosporin use.    Contrast Media [Iodinated Contrast Media] Hives and Itching   Latex Other (See Comments)    Burns and blisters skin   Other Other (See Comments)    antidepressants   Penicillins Hives    Has patient had a PCN reaction causing immediate rash, facial/tongue/throat swelling, SOB or lightheadedness with hypotension: Yes Has patient had a PCN reaction causing severe rash involving mucus membranes or skin necrosis: Yes Has patient had a PCN reaction that required hospitalization No Has patient had a PCN reaction occurring within the last 10 years: No If all of the above answers are "NO", then may proceed with Cephalosporin use.    Medications:  Current Outpatient Medications:    cephALEXin (KEFLEX) 500 MG capsule, Take 1 capsule (500 mg total) by mouth 2 (two) times daily for 7 days., Disp: 14 capsule, Rfl: 0   albuterol  (PROVENTIL  HFA;VENTOLIN  HFA) 108 (90 Base) MCG/ACT inhaler, Inhale 1-2 puffs into the lungs every 4 (four) hours as needed for wheezing or shortness of breath. For shortness of breath, Disp: 3.7 g, Rfl: 2   albuterol  (PROVENTIL ) (2.5 MG/3ML) 0.083% nebulizer solution, Take 3 mLs (2.5 mg total) by nebulization every 6 (six) hours as needed for wheezing or shortness of breath., Disp: 75 mL, Rfl: 12   albuterol  (PROVENTIL ) (5 MG/ML) 0.5% nebulizer solution, Take 0.5 mLs (2.5 mg total) by nebulization every 6 (six) hours as needed for wheezing or shortness of breath., Disp: 20 mL, Rfl: 12   albuterol  (VENTOLIN  HFA) 108 (90 Base) MCG/ACT inhaler, Inhale 1-2 puffs into the lungs every 6 (six) hours as needed for wheezing or shortness of breath., Disp: 1 each, Rfl: 0   Atogepant  (QULIPTA ) 60 MG TABS,  Take 1 tablet (60 mg total) by mouth daily., Disp: 30 tablet, Rfl: 11   Atogepant  (QULIPTA ) 60 MG TABS, Take 1 tablet (60 mg total) by mouth daily. NDC 60454-09811, LOT [id number] exp 5/27., Disp: , Rfl:    cyclobenzaprine  (FLEXERIL ) 10 MG tablet, Take 1 tablet (10 mg total) by mouth at bedtime. For muscle spasms/neck and low back muscle pain., Disp: 90 tablet, Rfl: 3   DULoxetine  (CYMBALTA ) 30 MG capsule, Take 30 mg by mouth., Disp: , Rfl:    Fluticasone -Salmeterol (ADVAIR) 250-50 MCG/DOSE AEPB, Inhale 1 puff into the lungs 2 (two) times daily., Disp: 60 each, Rfl: 5   methylPREDNISolone  (MEDROL  DOSEPAK) 4 MG TBPK tablet, Take pills daily all together with food. Take the first dose (6 pills) as soon as possible. Take the rest each morning. For 6 days total 6-5-4-3-2-1., Disp: 21 tablet, Rfl: 1   metoCLOPramide  (REGLAN ) 10 MG tablet, Take 1 tablet (10 mg total) by mouth every 6 (six) hours as needed for nausea., Disp: 30 tablet, Rfl: 11   Nebulizer System All-In-One MISC, 1 Device by Does not apply route every 6 (six) hours as needed., Disp: 1 each, Rfl: 0  omeprazole (PRILOSEC) 40 MG capsule, Take 40 mg by mouth 2 (two) times daily., Disp: , Rfl:    ondansetron  (ZOFRAN -ODT) 8 MG disintegrating tablet, Take 1 tablet (8 mg total) by mouth every 8 (eight) hours as needed for nausea or vomiting., Disp: 30 tablet, Rfl: 11   rizatriptan  (MAXALT -MLT) 10 MG disintegrating tablet, Take 1 tablet (10 mg total) by mouth as needed for migraine. May repeat in 2 hours if needed, Disp: 9 tablet, Rfl: 11   tiZANidine  (ZANAFLEX ) 4 MG tablet, Take 4 mg by mouth., Disp: , Rfl:    tobramycin  (TOBREX ) 0.3 % ophthalmic solution, Place 1 drop into the right eye every 6 (six) hours., Disp: 5 mL, Rfl: 0   Ubrogepant  (UBRELVY ) 100 MG TABS, Take 1 tablet (100 mg total) by mouth every 2 (two) hours as needed. Maximum 200mg  a day., Disp: 16 tablet, Rfl: 11   Ubrogepant  (UBRELVY ) 100 MG TABS, Take 1 tablet onset migraine, may take  another 2 hours later if needed. NDC 9147-8295-62 LOT [id number] exp 12/26., Disp: 4 tablet, Rfl: 0  Observations/Objective: Patient is well-developed, well-nourished in no acute distress.  Resting comfortably  at home.  Head is normocephalic, atraumatic.  No labored breathing.  Speech is clear and coherent with logical content.  Patient is alert and oriented at baseline.    Assessment and Plan: 1. Suspected UTI (Primary)  Patient presenting with urinary frequency most consistent with UTI. I also considered PID, pregnancy, ectopic pregnancy, endometriosis, tubovarian abscess, appendicitis,  BV,  and pyelonephritis, but this appears less likely considering the data gathered thus far.  Medication prescribed. I have instructed the patient to present to the nearest ER at any time if there are any new or worsening symptoms.  The patient expressed understanding of and agreement with this plan.  Opportunity was given for questions prior to discharge and all stated questions were answered to the patient's satisfaction.   Follow Up Instructions: I discussed the assessment and treatment plan with the patient. The patient was provided an opportunity to ask questions and all were answered. The patient agreed with the plan and demonstrated an understanding of the instructions.  A copy of instructions were sent to the patient via MyChart unless otherwise noted below.     The patient was advised to call back or seek an in-person evaluation if the symptoms worsen or if the condition fails to improve as anticipated.    Marciana Settle, PA-C

## 2023-10-23 ENCOUNTER — Encounter (HOSPITAL_BASED_OUTPATIENT_CLINIC_OR_DEPARTMENT_OTHER): Payer: Self-pay

## 2023-10-23 ENCOUNTER — Emergency Department (HOSPITAL_BASED_OUTPATIENT_CLINIC_OR_DEPARTMENT_OTHER)

## 2023-10-23 ENCOUNTER — Emergency Department (HOSPITAL_BASED_OUTPATIENT_CLINIC_OR_DEPARTMENT_OTHER)
Admission: EM | Admit: 2023-10-23 | Discharge: 2023-10-23 | Disposition: A | Discharge status: Home / Self Care | Attending: Emergency Medicine | Admitting: Emergency Medicine

## 2023-10-23 ENCOUNTER — Other Ambulatory Visit: Payer: Self-pay

## 2023-10-23 DIAGNOSIS — X58XXXA Exposure to other specified factors, initial encounter: Secondary | ICD-10-CM | POA: Insufficient documentation

## 2023-10-23 DIAGNOSIS — M79604 Pain in right leg: Secondary | ICD-10-CM | POA: Diagnosis present

## 2023-10-23 DIAGNOSIS — J45909 Unspecified asthma, uncomplicated: Secondary | ICD-10-CM | POA: Insufficient documentation

## 2023-10-23 DIAGNOSIS — Z7951 Long term (current) use of inhaled steroids: Secondary | ICD-10-CM | POA: Diagnosis not present

## 2023-10-23 DIAGNOSIS — Z91040 Latex allergy status: Secondary | ICD-10-CM | POA: Insufficient documentation

## 2023-10-23 DIAGNOSIS — Y9301 Activity, walking, marching and hiking: Secondary | ICD-10-CM | POA: Insufficient documentation

## 2023-10-23 DIAGNOSIS — S86111A Strain of other muscle(s) and tendon(s) of posterior muscle group at lower leg level, right leg, initial encounter: Secondary | ICD-10-CM | POA: Insufficient documentation

## 2023-10-23 DIAGNOSIS — S86811A Strain of other muscle(s) and tendon(s) at lower leg level, right leg, initial encounter: Secondary | ICD-10-CM

## 2023-10-23 NOTE — Discharge Instructions (Signed)
 Recommend massage and rest to your right calf.  Follow-up with your primary care doctor.

## 2023-10-23 NOTE — ED Provider Notes (Signed)
 [place] EMERGENCY DEPARTMENT AT [facility] Provider Note   CSN: [id number] Arrival date & time: 10/23/23  4098     History  Chief Complaint  Patient presents with   Leg Pain    [name] is a 43 y.o. female.  Patient here with pain to her right calf the last couple days.  Some swelling.  Sounds like family member with a blood clot in the past.  Pain started while she was walking and doing some shopping a couple days ago.  Pain has not resolved.  She has some discomfort in her left calf at the same time but that resolved.  She has a history of fibromyalgia asthma migraine.  Denies any weakness numbness tingling.  Denies any trauma.  The history is provided by the patient.       Home Medications Prior to Admission medications   Medication Sig Start Date End Date Taking? Authorizing Provider  albuterol  (PROVENTIL  HFA;VENTOLIN  HFA) 108 (90 Base) MCG/ACT inhaler Inhale 1-2 puffs into the lungs every 4 (four) hours as needed for wheezing or shortness of breath. For shortness of breath 04/18/18   Trudy Fusi, DO  albuterol  (PROVENTIL ) (2.5 MG/3ML) 0.083% nebulizer solution Take 3 mLs (2.5 mg total) by nebulization every 6 (six) hours as needed for wheezing or shortness of breath. 02/20/22   Dodson Freestone, FNP  albuterol  (PROVENTIL ) (5 MG/ML) 0.5% nebulizer solution Take 0.5 mLs (2.5 mg total) by nebulization every 6 (six) hours as needed for wheezing or shortness of breath. 04/18/18   Trudy Fusi, DO  albuterol  (VENTOLIN  HFA) 108 (90 Base) MCG/ACT inhaler Inhale 1-2 puffs into the lungs every 6 (six) hours as needed for wheezing or shortness of breath. 02/21/23   Dodson Freestone, FNP  Atogepant  (QULIPTA ) 60 MG TABS Take 1 tablet (60 mg total) by mouth daily. 09/08/23   [name], MD  Atogepant  (QULIPTA ) 60 MG TABS Take 1 tablet (60 mg total) by mouth daily. NDC 11914-78295, LOT [id number] exp 5/27. 09/09/23   [name], MD  cyclobenzaprine  (FLEXERIL ) 10 MG tablet  Take 1 tablet (10 mg total) by mouth at bedtime. For muscle spasms/neck and low back muscle pain. 09/07/23   [name], MD  DULoxetine  (CYMBALTA ) 30 MG capsule Take 30 mg by mouth. 02/07/21   [provider]  Fluticasone -Salmeterol (ADVAIR) 250-50 MCG/DOSE AEPB Inhale 1 puff into the lungs 2 (two) times daily. 04/18/18   Trudy Fusi, DO  methylPREDNISolone  (MEDROL  DOSEPAK) 4 MG TBPK tablet Take pills daily all together with food. Take the first dose (6 pills) as soon as possible. Take the rest each morning. For 6 days total 6-5-4-3-2-1. 04/14/23   [name], MD  metoCLOPramide  (REGLAN ) 10 MG tablet Take 1 tablet (10 mg total) by mouth every 6 (six) hours as needed for nausea. 09/07/23   [name], MD  Nebulizer System All-In-One MISC 1 Device by Does not apply route every 6 (six) hours as needed. 02/20/22   Dodson Freestone, FNP  omeprazole (PRILOSEC) 40 MG capsule Take 40 mg by mouth 2 (two) times daily.    [provider]  ondansetron  (ZOFRAN -ODT) 8 MG disintegrating tablet Take 1 tablet (8 mg total) by mouth every 8 (eight) hours as needed for nausea or vomiting. 04/14/23   [name], MD  rizatriptan  (MAXALT -MLT) 10 MG disintegrating tablet Take 1 tablet (10 mg total) by mouth as needed for migraine. May repeat in 2 hours if  needed 04/14/23   [name], MD  tiZANidine  (ZANAFLEX ) 4 MG tablet Take 4 mg by mouth. 06/25/22   [provider]  tobramycin  (TOBREX ) 0.3 % ophthalmic solution Place 1 drop into the right eye every 6 (six) hours. 09/23/23   Afton Albright, MD  Ubrogepant  (UBRELVY ) 100 MG TABS Take 1 tablet (100 mg total) by mouth every 2 (two) hours as needed. Maximum 200mg  a day. 09/08/23   [name], MD  Ubrogepant  (UBRELVY ) 100 MG TABS Take 1 tablet onset migraine, may take another 2 hours later if needed. NDC 9811-9147-82 LOT [id number] exp 12/26. 09/09/23   [name], MD      Allergies    Amoxicillin, Contrast media [iodinated  contrast media], Latex, Other, and Penicillins    Review of Systems   Review of Systems  Physical Exam Updated Vital Signs BP (!) 126/97 (BP Location: Right Arm)   Temp 98 F (36.7 C) (Oral)   Resp 18   Ht 5\' 1"  (1.549 m)   Wt 77.1 kg   LMP 09/25/2023 (Exact Date)   SpO2 100%   BMI 32.12 kg/m  Physical Exam Vitals and nursing note reviewed.  Constitutional:      General: She is not in acute distress.    Appearance: She is well-developed. She is not ill-appearing.  HENT:     Head: Normocephalic and atraumatic.     Mouth/Throat:     Mouth: Mucous membranes are moist.  Eyes:     Extraocular Movements: Extraocular movements intact.     Conjunctiva/sclera: Conjunctivae normal.     Pupils: Pupils are equal, round, and reactive to light.  Cardiovascular:     Rate and Rhythm: Normal rate and regular rhythm.     Pulses: Normal pulses.     Heart sounds: Normal heart sounds. No murmur heard. Pulmonary:     Effort: Pulmonary effort is normal. No respiratory distress.     Breath sounds: Normal breath sounds.  Abdominal:     Palpations: Abdomen is soft.     Tenderness: There is no abdominal tenderness.  Musculoskeletal:        General: Tenderness present. No swelling. Normal range of motion.     Cervical back: Normal range of motion and neck supple.     Comments: Tenderness to the left calf but there is no redness major swelling  Skin:    General: Skin is warm and dry.     Capillary Refill: Capillary refill takes less than 2 seconds.  Neurological:     General: No focal deficit present.     Mental Status: She is alert and oriented to person, place, and time.     Cranial Nerves: No cranial nerve deficit.     Sensory: No sensory deficit.     Motor: No weakness.     Coordination: Coordination normal.  Psychiatric:        Mood and Affect: Mood normal.     ED Results / Procedures / Treatments   Labs (all labs ordered are listed, but only abnormal results are displayed) Labs  Reviewed - No data to display  EKG None  Radiology US  Venous Img Lower Right (DVT Study) Result Date: 10/23/2023 CLINICAL DATA:  Right lower extremity swelling. EXAM: Right LOWER EXTREMITY VENOUS DOPPLER ULTRASOUND TECHNIQUE: Gray-scale sonography with compression, as well as color and duplex ultrasound, were performed to evaluate the deep venous system(s) from the level of the common femoral vein through the popliteal and proximal calf veins. COMPARISON:  None Available. FINDINGS: VENOUS Normal compressibility of the common femoral, superficial femoral, and popliteal veins, as well as the visualized calf veins. Visualized portions of profunda femoral vein and great saphenous vein unremarkable. No filling defects to suggest DVT on grayscale or color Doppler imaging. Doppler waveforms show normal direction of venous flow, normal respiratory plasticity and response to augmentation. Limited views of the contralateral common femoral vein are unremarkable. OTHER None. Limitations: none IMPRESSION: Negative. Electronically Signed   By: Angus Bark M.D.   On: 10/23/2023 09:21    Procedures Procedures    Medications Ordered in ED Medications - No data to display  ED Course/ Medical Decision Making/ A&P                                 Medical Decision Making  Amada Hallisey Brew is here with right calf pain.  Normal vitals.  No fever.  History of fibromyalgia.  Differential diagnosis likely mild strain/muscle spasm but will evaluate for DVT with ultrasound.  She is get normal pulses on exam.  No concern for arterial process/occlusion.  There is no signs of cellulitis.  There is no major swelling to either calf.  No concern for any other emergent process at this time.  Not having any nausea vomiting but diarrhea.  No need for labs.  I do not think she has any major electrolyte abnormalities given the history and physical.  Overall DVT study negative per radiology report.  I suspect mild calf strain.   Discharged in good condition.  Understands return precautions.  Recommend massage rest Tylenol  ibuprofen  she can take those meds.  This chart was dictated using voice recognition software.  Despite best efforts to proofread,  errors can occur which can change the documentation meaning.         Final Clinical Impression(s) / ED Diagnoses Final diagnoses:  Strain of calf muscle, right, initial encounter    Rx / DC Orders ED Discharge Orders     None         Lowery Rue, DO 10/23/23 [phone]

## 2023-10-23 NOTE — ED Triage Notes (Signed)
 She reports feeling non-traumatic pain while ambulating while shopping this Wed. She c/o persistent right upper calf pain with some visible swelling at medial, proximal right calf. She tells me she is otherwise healthy.

## 2023-12-23 ENCOUNTER — Other Ambulatory Visit (HOSPITAL_COMMUNITY): Payer: Self-pay

## 2023-12-23 ENCOUNTER — Telehealth: Payer: Self-pay

## 2023-12-23 NOTE — Telephone Encounter (Signed)
 It Is time to renew PA for Qulipta  and insurance needs documentation of how the medication is working for the pt. Please see question below.

## 2024-01-13 ENCOUNTER — Telehealth: Admitting: Physician Assistant

## 2024-01-13 DIAGNOSIS — R3989 Other symptoms and signs involving the genitourinary system: Secondary | ICD-10-CM | POA: Diagnosis not present

## 2024-01-13 MED ORDER — NITROFURANTOIN MONOHYD MACRO 100 MG PO CAPS: 100.0000 mg | ORAL_CAPSULE | Freq: Two times a day (BID) | ORAL | 0 refills | Status: AC

## 2024-01-13 NOTE — Progress Notes (Signed)
 Virtual Visit Consent   [name], you are scheduled for a virtual visit with a [facility] provider today. Just as with appointments in the office, your consent must be obtained to participate. Your consent will be active for this visit and any virtual visit you may have with one of our providers in the next 365 days. If you have a MyChart account, a copy of this consent can be sent to you electronically.  As this is a virtual visit, video technology does not allow for your provider to perform a traditional examination. This may limit your provider's ability to fully assess your condition. If your provider identifies any concerns that need to be evaluated in person or the need to arrange testing (such as labs, EKG, etc.), we will make arrangements to do so. Although advances in technology are sophisticated, we cannot ensure that it will always work on either your end or our end. If the connection with a video visit is poor, the visit may have to be switched to a telephone visit. With either a video or telephone visit, we are not always able to ensure that we have a secure connection.  By engaging in this virtual visit, you consent to the provision of healthcare and authorize for your insurance to be billed (if applicable) for the services provided during this visit. Depending on your insurance coverage, you may receive a charge related to this service.  I need to obtain your verbal consent now. Are you willing to proceed with your visit today? [name] has provided verbal consent on 01/13/2024 for a virtual visit (video or telephone). [name], [place]  Date: 01/13/2024 8:09 AM   Virtual Visit via Video Note   I, [name], connected with  [name]  ([id number], 04/03/1981) on 01/13/24 at  8:00 AM EDT by a video-enabled telemedicine application and verified that I am speaking with the correct person using two identifiers.  Location: Patient: Virtual Visit  Location Patient: Home Provider: Virtual Visit Location Provider: Home Office   I discussed the limitations of evaluation and management by telemedicine and the availability of in person appointments. The patient expressed understanding and agreed to proceed.    History of Present Illness: [name] is a 43 y.o. who identifies as a female who was assigned female at birth, and is being seen today for possible UTI. Endorses symptoms starting about 4 days ago with urinary urgency and hesitancy. Since then noting dysuria, cloudy and odorous urine. Denies fever, chills, vomiting, back/belly pain. LMP 3 weeks ago. Denies concerns for pregnancy.   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   GERD without esophagitis 05/17/2018   Adverse food reaction 04/18/2018   Gastroesophageal reflux disease 04/18/2018   Moderate persistent asthma, uncomplicated 02/17/2017   Seasonal and perennial allergic rhinitis 02/17/2017   Morbid obesity with body mass index of 45.0-49.9 in adult [facility]) 07/18/2015   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   ORTHOSTATIC HYPOTENSION 05/23/2009   SYNCOPE 05/23/2009   MIGRAINE HEADACHE 05/22/2009   Asthma 05/22/2009   UTI 05/22/2009   PALPITATIONS 05/22/2009   SHORTNESS OF BREATH 05/22/2009    Allergies:  Allergies  Allergen Reactions   Amoxicillin Hives    Has patient had  a PCN reaction causing immediate rash, facial/tongue/throat swelling, SOB or lightheadedness with hypotension: Yes Has patient had a PCN reaction causing severe rash involving mucus membranes or skin necrosis: Yes Has patient had a PCN reaction that required hospitalization No Has patient had a PCN reaction occurring within the last 10 years: No If  all of the above answers are NO, then may proceed with Cephalosporin use.    Contrast Media [Iodinated Contrast Media] Hives and Itching   Latex Other (See Comments)    Burns and blisters skin   Other Other (See Comments)    antidepressants   Penicillins Hives    Has patient had a PCN reaction causing immediate rash, facial/tongue/throat swelling, SOB or lightheadedness with hypotension: Yes Has patient had a PCN reaction causing severe rash involving mucus membranes or skin necrosis: Yes Has patient had a PCN reaction that required hospitalization No Has patient had a PCN reaction occurring within the last 10 years: No If all of the above answers are NO, then may proceed with Cephalosporin use.    Medications:  Current Outpatient Medications:    nitrofurantoin , macrocrystal-monohydrate, (MACROBID ) 100 MG capsule, Take 1 capsule (100 mg total) by mouth 2 (two) times daily., Disp: 10 capsule, Rfl: 0   albuterol  (PROVENTIL  HFA;VENTOLIN  HFA) 108 (90 Base) MCG/ACT inhaler, Inhale 1-2 puffs into the lungs every 4 (four) hours as needed for wheezing or shortness of breath. For shortness of breath, Disp: 3.7 g, Rfl: 2   albuterol  (PROVENTIL ) (2.5 MG/3ML) 0.083% nebulizer solution, Take 3 mLs (2.5 mg total) by nebulization every 6 (six) hours as needed for wheezing or shortness of breath., Disp: 75 mL, Rfl: 12   albuterol  (PROVENTIL ) (5 MG/ML) 0.5% nebulizer solution, Take 0.5 mLs (2.5 mg total) by nebulization every 6 (six) hours as needed for wheezing or shortness of breath., Disp: 20 mL, Rfl: 12   albuterol  (VENTOLIN  HFA) 108 (90 Base) MCG/ACT inhaler, Inhale 1-2 puffs into the lungs every 6 (six) hours as needed for wheezing or shortness of breath., Disp: 1 each, Rfl: 0   Atogepant  (QULIPTA ) 60 MG TABS, Take 1 tablet (60 mg total) by mouth daily., Disp: 30 tablet, Rfl: 11   Atogepant  (QULIPTA ) 60 MG TABS, Take 1 tablet (60 mg total) by mouth daily. NDC 99252-90595, LOT [id number] exp 5/27., Disp:  , Rfl:    cyclobenzaprine  (FLEXERIL ) 10 MG tablet, Take 1 tablet (10 mg total) by mouth at bedtime. For muscle spasms/neck and low back muscle pain., Disp: 90 tablet, Rfl: 3   Fluticasone -Salmeterol (ADVAIR) 250-50 MCG/DOSE AEPB, Inhale 1 puff into the lungs 2 (two) times daily., Disp: 60 each, Rfl: 5   metoCLOPramide  (REGLAN ) 10 MG tablet, Take 1 tablet (10 mg total) by mouth every 6 (six) hours as needed for nausea., Disp: 30 tablet, Rfl: 11   Nebulizer System All-In-One MISC, 1 Device by Does not apply route every 6 (six) hours as needed., Disp: 1 each, Rfl: 0   omeprazole (PRILOSEC) 40 MG capsule, Take 40 mg by mouth 2 (two) times daily., Disp: , Rfl:    Ubrogepant  (UBRELVY ) 100 MG TABS, Take 1 tablet (100 mg total) by mouth every 2 (two) hours as needed. Maximum 200mg  a day., Disp: 16 tablet, Rfl: 11   Ubrogepant  (UBRELVY ) 100 MG TABS, Take 1 tablet onset migraine, may take another 2 hours later if needed. NDC 9976-3498-98 LOT [id number] exp 12/26., Disp: 4 tablet, Rfl: 0  Observations/Objective: Patient is well-developed, well-nourished in no acute distress.  Resting comfortably at home.  Head is normocephalic, atraumatic.  No labored breathing. Speech is clear and coherent with logical content.  Patient is alert and oriented at baseline.   Assessment and Plan: 1. Suspected UTI (Primary) - nitrofurantoin , macrocrystal-monohydrate, (MACROBID ) 100 MG capsule; Take 1 capsule (100 mg total) by mouth 2 (two) times daily.  Dispense: 10 capsule; Refill: 0  Classic UTI symptoms with absence of alarm signs or symptoms. Prior history of UTI. Will treat empirically with Macrobid  for suspected uncomplicated cystitis. Supportive measures and OTC medications reviewed. Strict in-person evaluation precautions discussed.    Follow Up Instructions: I discussed the assessment and treatment plan with the patient. The patient was provided an opportunity to ask questions and all were answered. The patient  agreed with the plan and demonstrated an understanding of the instructions.  A copy of instructions were sent to the patient via MyChart unless otherwise noted below.   The patient was advised to call back or seek an in-person evaluation if the symptoms worsen or if the condition fails to improve as anticipated.    Melinda Velma Lunger, PA-C

## 2024-01-13 NOTE — Patient Instructions (Addendum)
 Graeme PARAS Bauer, thank you for joining [name], PA-C for today's virtual visit.  While this provider is not your primary care provider (PCP), if your PCP is located in our provider database this encounter information will be shared with them immediately following your visit.   A [facility] MyChart account gives you access to today's visit and all your visits, tests, and labs performed at [facility]  click here if you don't have a [facility] MyChart account or go to [email or web address]  Consent: (Patient) Graeme PARAS Wetherby provided verbal consent for this virtual visit at the beginning of the encounter.  Current Medications:  Current Outpatient Medications:    albuterol  (PROVENTIL  HFA;VENTOLIN  HFA) 108 (90 Base) MCG/ACT inhaler, Inhale 1-2 puffs into the lungs every 4 (four) hours as needed for wheezing or shortness of breath. For shortness of breath, Disp: 3.7 g, Rfl: 2   albuterol  (PROVENTIL ) (2.5 MG/3ML) 0.083% nebulizer solution, Take 3 mLs (2.5 mg total) by nebulization every 6 (six) hours as needed for wheezing or shortness of breath., Disp: 75 mL, Rfl: 12   albuterol  (PROVENTIL ) (5 MG/ML) 0.5% nebulizer solution, Take 0.5 mLs (2.5 mg total) by nebulization every 6 (six) hours as needed for wheezing or shortness of breath., Disp: 20 mL, Rfl: 12   albuterol  (VENTOLIN  HFA) 108 (90 Base) MCG/ACT inhaler, Inhale 1-2 puffs into the lungs every 6 (six) hours as needed for wheezing or shortness of breath., Disp: 1 each, Rfl: 0   Atogepant  (QULIPTA ) 60 MG TABS, Take 1 tablet (60 mg total) by mouth daily., Disp: 30 tablet, Rfl: 11   Atogepant  (QULIPTA ) 60 MG TABS, Take 1 tablet (60 mg total) by mouth daily. NDC 99252-90595, LOT [id number] exp 5/27., Disp: , Rfl:    cyclobenzaprine  (FLEXERIL ) 10 MG tablet, Take 1 tablet (10 mg total) by mouth at bedtime. For muscle spasms/neck and low back muscle pain., Disp: 90 tablet, Rfl: 3   DULoxetine  (CYMBALTA ) 30 MG capsule, Take  30 mg by mouth., Disp: , Rfl:    Fluticasone -Salmeterol (ADVAIR) 250-50 MCG/DOSE AEPB, Inhale 1 puff into the lungs 2 (two) times daily., Disp: 60 each, Rfl: 5   methylPREDNISolone  (MEDROL  DOSEPAK) 4 MG TBPK tablet, Take pills daily all together with food. Take the first dose (6 pills) as soon as possible. Take the rest each morning. For 6 days total 6-5-4-3-2-1., Disp: 21 tablet, Rfl: 1   metoCLOPramide  (REGLAN ) 10 MG tablet, Take 1 tablet (10 mg total) by mouth every 6 (six) hours as needed for nausea., Disp: 30 tablet, Rfl: 11   Nebulizer System All-In-One MISC, 1 Device by Does not apply route every 6 (six) hours as needed., Disp: 1 each, Rfl: 0   omeprazole (PRILOSEC) 40 MG capsule, Take 40 mg by mouth 2 (two) times daily., Disp: , Rfl:    ondansetron  (ZOFRAN -ODT) 8 MG disintegrating tablet, Take 1 tablet (8 mg total) by mouth every 8 (eight) hours as needed for nausea or vomiting., Disp: 30 tablet, Rfl: 11   rizatriptan  (MAXALT -MLT) 10 MG disintegrating tablet, Take 1 tablet (10 mg total) by mouth as needed for migraine. May repeat in 2 hours if needed, Disp: 9 tablet, Rfl: 11   tiZANidine  (ZANAFLEX ) 4 MG tablet, Take 4 mg by mouth., Disp: , Rfl:    tobramycin  (TOBREX ) 0.3 % ophthalmic solution, Place 1 drop into the right eye every 6 (six) hours., Disp: 5 mL, Rfl: 0   Ubrogepant  (UBRELVY ) 100 MG TABS, Take 1 tablet (100 mg total) by mouth  every 2 (two) hours as needed. Maximum 200mg  a day., Disp: 16 tablet, Rfl: 11   Ubrogepant  (UBRELVY ) 100 MG TABS, Take 1 tablet onset migraine, may take another 2 hours later if needed. NDC 9976-3498-98 LOT [id number] exp 12/26., Disp: 4 tablet, Rfl: 0   Medications ordered in this encounter:  No orders of the defined types were placed in this encounter.    *If you need refills on other medications prior to your next appointment, please contact your pharmacy*  Follow-Up: Call back or seek an in-person evaluation if the symptoms worsen or if the condition  fails to improve as anticipated.  [facility] [phone]  Other Instructions Your symptoms are consistent with a bladder infection, also called acute cystitis. Please take your antibiotic (Macrobid ) as directed until all pills are gone.  Stay very well hydrated.  Consider a daily probiotic (Align, Culturelle, or Activia) to help prevent stomach upset caused by the antibiotic.  Taking a probiotic daily may also help prevent recurrent UTIs.  Also consider taking AZO (Phenazopyridine) tablets to help decrease pain with urination. If you note any non-resolving, new, or worsening symptoms despite treatment, please seek an in-person evaluation ASAP.   Urinary Tract Infection A urinary tract infection (UTI) can occur any place along the urinary tract. The tract includes the kidneys, ureters, bladder, and urethra. A type of germ called bacteria often causes a UTI. UTIs are often helped with antibiotic medicine.  HOME CARE  If given, take antibiotics as told by your doctor. Finish them even if you start to feel better. Drink enough fluids to keep your pee (urine) clear or pale yellow. Avoid tea, drinks with caffeine, and bubbly (carbonated) drinks. Pee often. Avoid holding your pee in for a long time. Pee before and after having sex (intercourse). Wipe from front to back after you poop (bowel movement) if you are a woman. Use each tissue only once. GET HELP RIGHT AWAY IF:  You have back pain. You have lower belly (abdominal) pain. You have chills. You feel sick to your stomach (nauseous). You throw up (vomit). Your burning or discomfort with peeing does not go away. You have a fever. Your symptoms are not better in 3 days. MAKE SURE YOU:  Understand these instructions. Will watch your condition. Will get help right away if you are not doing well or get worse. Document Released: 10/21/2007 Document Revised: 01/27/2012 Document Reviewed: 12/03/2011 [facility] Patient Information  2015 [place], [place]. This information is not intended to replace advice given to you by your health care provider. Make sure you discuss any questions you have with your health care provider.    If you have been instructed to have an in-person evaluation today at a local Urgent Care facility, please use the link below. It will take you to a list of all of our available [facility], including address, phone number and hours of operation. Please do not delay care.  [facility]  If you or a family member do not have a primary care provider, use the link below to schedule a visit and establish care. When you choose a [facility] primary care physician or advanced practice provider, you gain a long-term partner in health. Find a Primary Care Provider  Learn more about [facility]'s in-office and virtual care options: [facility] - Get Care Now

## 2024-02-21 NOTE — Telephone Encounter (Signed)
 Left voicemail with patient to reschedule 10/27 appointment. If patient calls back, she can be rescheduled with Dr Margaret.

## 2024-03-13 ENCOUNTER — Telehealth: Admitting: Neurology

## 2024-05-21 ENCOUNTER — Encounter (HOSPITAL_BASED_OUTPATIENT_CLINIC_OR_DEPARTMENT_OTHER): Payer: Self-pay

## 2024-05-21 ENCOUNTER — Other Ambulatory Visit: Payer: Self-pay

## 2024-05-21 ENCOUNTER — Emergency Department (HOSPITAL_BASED_OUTPATIENT_CLINIC_OR_DEPARTMENT_OTHER)
Admission: EM | Admit: 2024-05-21 | Discharge: 2024-05-21 | Disposition: A | Discharge status: Home / Self Care | Attending: Emergency Medicine | Admitting: Emergency Medicine

## 2024-05-21 DIAGNOSIS — J45909 Unspecified asthma, uncomplicated: Secondary | ICD-10-CM | POA: Diagnosis not present

## 2024-05-21 DIAGNOSIS — J069 Acute upper respiratory infection, unspecified: Secondary | ICD-10-CM | POA: Insufficient documentation

## 2024-05-21 DIAGNOSIS — R059 Cough, unspecified: Secondary | ICD-10-CM | POA: Diagnosis present

## 2024-05-21 LAB — RESP PANEL BY RT-PCR (RSV, FLU A&B, COVID)  RVPGX2
Influenza A by PCR: NEGATIVE
Influenza B by PCR: NEGATIVE
Resp Syncytial Virus by PCR: NEGATIVE
SARS Coronavirus 2 by RT PCR: NEGATIVE

## 2024-05-21 MED ORDER — DEXAMETHASONE 4 MG PO TABS
10.0000 mg | ORAL_TABLET | Freq: Once | ORAL | Status: AC
  Administered 2024-05-21: 10 mg via ORAL
  Filled 2024-05-21: qty 3

## 2024-05-21 NOTE — Discharge Instructions (Signed)
 Return if symptoms worsen.  Continue Tylenol  and ibuprofen 

## 2024-05-21 NOTE — ED Triage Notes (Signed)
 Patient arrives POV to the ED with complaints of cough, right ear pain x4 days. Patient also reports worsening dizziness as well.

## 2024-05-21 NOTE — ED Provider Notes (Signed)
 " [place] EMERGENCY DEPARTMENT AT [facility] Provider Note   CSN: [id number] Arrival date & time: 05/21/24  1640     Patient presents with: URI   [name] is a 44 y.o. female.   Flulike symptoms for the last several days.  Multiple family members with the same.  History of asthma.  Nothing makes it worse or better.  Denies any fever chills shortness of breath.  Little bit of right ear discomfort.  No major sore throat.  The history is provided by the patient.       Prior to Admission medications  Medication Sig Start Date End Date Taking? Authorizing Provider  albuterol  (PROVENTIL  HFA;VENTOLIN  HFA) 108 (90 Base) MCG/ACT inhaler Inhale 1-2 puffs into the lungs every 4 (four) hours as needed for wheezing or shortness of breath. For shortness of breath 04/18/18   Luke Orlan HERO, DO  albuterol  (PROVENTIL ) (2.5 MG/3ML) 0.083% nebulizer solution Take 3 mLs (2.5 mg total) by nebulization every 6 (six) hours as needed for wheezing or shortness of breath. 02/20/22   Hazen Darryle BRAVO, FNP  albuterol  (PROVENTIL ) (5 MG/ML) 0.5% nebulizer solution Take 0.5 mLs (2.5 mg total) by nebulization every 6 (six) hours as needed for wheezing or shortness of breath. 04/18/18   Luke Orlan HERO, DO  albuterol  (VENTOLIN  HFA) 108 (90 Base) MCG/ACT inhaler Inhale 1-2 puffs into the lungs every 6 (six) hours as needed for wheezing or shortness of breath. 02/21/23   Hazen Darryle BRAVO, FNP  Atogepant  (QULIPTA ) 60 MG TABS Take 1 tablet (60 mg total) by mouth daily. 09/08/23   Ines Onetha NOVAK, MD  Atogepant  (QULIPTA ) 60 MG TABS Take 1 tablet (60 mg total) by mouth daily. NDC 99252-90595, LOT [id number] exp 5/27. 09/09/23   Ines Onetha NOVAK, MD  cyclobenzaprine  (FLEXERIL ) 10 MG tablet Take 1 tablet (10 mg total) by mouth at bedtime. For muscle spasms/neck and low back muscle pain. 09/07/23   Ines Onetha NOVAK, MD  Fluticasone -Salmeterol (ADVAIR) 250-50 MCG/DOSE AEPB Inhale 1 puff into the lungs 2 (two) times daily. 04/18/18    Luke Orlan HERO, DO  metoCLOPramide  (REGLAN ) 10 MG tablet Take 1 tablet (10 mg total) by mouth every 6 (six) hours as needed for nausea. 09/07/23   Ines Onetha NOVAK, MD  Nebulizer System All-In-One MISC 1 Device by Does not apply route every 6 (six) hours as needed. 02/20/22   Hazen Darryle BRAVO, FNP  nitrofurantoin , macrocrystal-monohydrate, (MACROBID ) 100 MG capsule Take 1 capsule (100 mg total) by mouth 2 (two) times daily. 01/13/24   Gladis Elsie BROCKS, PA-C  omeprazole (PRILOSEC) 40 MG capsule Take 40 mg by mouth 2 (two) times daily.    [provider]  Ubrogepant  (UBRELVY ) 100 MG TABS Take 1 tablet (100 mg total) by mouth every 2 (two) hours as needed. Maximum 200mg  a day. 09/08/23   Ines Onetha NOVAK, MD  Ubrogepant  (UBRELVY ) 100 MG TABS Take 1 tablet onset migraine, may take another 2 hours later if needed. NDC 9976-3498-98 LOT [id number] exp 12/26. 09/09/23   Ines Onetha NOVAK, MD    Allergies: Amoxicillin, Contrast media [iodinated contrast media], Latex, Other, and Penicillins    Review of Systems  Updated Vital Signs BP 120/77 (BP Location: Right Arm)   Pulse (!) 105   Temp 98 F (36.7 C)   Resp 16   Ht 5' 1 (1.549 m)   Wt 89.8 kg   SpO2 100%   BMI 37.41 kg/m   Physical Exam Vitals and nursing  note reviewed.  Constitutional:      General: She is not in acute distress.    Appearance: She is well-developed. She is not ill-appearing.  HENT:     Head: Normocephalic and atraumatic.     Right Ear: Tympanic membrane normal.     Left Ear: Tympanic membrane normal.     Mouth/Throat:     Mouth: Mucous membranes are moist.  Eyes:     Extraocular Movements: Extraocular movements intact.     Conjunctiva/sclera: Conjunctivae normal.     Pupils: Pupils are equal, round, and reactive to light.  Cardiovascular:     Rate and Rhythm: Normal rate and regular rhythm.     Pulses: Normal pulses.     Heart sounds: Normal heart sounds. No murmur heard. Pulmonary:     Effort: Pulmonary  effort is normal. No respiratory distress.     Breath sounds: Normal breath sounds.  Abdominal:     Palpations: Abdomen is soft.     Tenderness: There is no abdominal tenderness.  Musculoskeletal:        General: No swelling.     Cervical back: Normal range of motion and neck supple.  Skin:    General: Skin is warm and dry.     Capillary Refill: Capillary refill takes less than 2 seconds.  Neurological:     General: No focal deficit present.     Mental Status: She is alert.  Psychiatric:        Mood and Affect: Mood normal.     (all labs ordered are listed, but only abnormal results are displayed) Labs Reviewed  RESP PANEL BY RT-PCR (RSV, FLU A&B, COVID)  [id number]    EKG: None  Radiology: No results found.   Procedures   Medications Ordered in the ED  dexamethasone  (DECADRON ) tablet 10 mg (has no administration in time range)                                    Medical Decision Making Risk Prescription drug management.   Graeme PARAS [place] is here with flulike symptoms for the last several days.  Unremarkable vitals.  No fever.  COVID flu RSV test negative.  Multiple family members with the same symptoms.  I do think likely still a viral process.  Continue Tylenol  ibuprofen  at home.  Given a dose of Decadron  here.  Continue supportive care.  Have no concern for pneumonia or other acute process but they understand to return if symptoms worsen or are prolonged longer than 6 to 7 days likely needs further workup.  Discharged in good condition.  Understands return precautions.  This chart was dictated using voice recognition software.  Despite best efforts to proofread,  errors can occur which can change the documentation meaning.      Final diagnoses:  Upper respiratory tract infection, unspecified type    ED Discharge Orders     None          Ruthe Cornet, DO 05/21/24 1845  "
